# Patient Record
Sex: Male | Born: 1970 | Race: Black or African American | Hispanic: No | Marital: Single
Health system: Southern US, Community
[De-identification: ages and names within clinical notes are randomized; demographics above are authoritative.]

## PROBLEM LIST (undated history)

## (undated) DIAGNOSIS — F32A Depression, unspecified: Secondary | ICD-10-CM

## (undated) DIAGNOSIS — S31119A Laceration without foreign body of abdominal wall, unspecified quadrant without penetration into peritoneal cavity, initial encounter: Secondary | ICD-10-CM

## (undated) DIAGNOSIS — F1010 Alcohol abuse, uncomplicated: Secondary | ICD-10-CM

## (undated) DIAGNOSIS — G8929 Other chronic pain: Secondary | ICD-10-CM

## (undated) DIAGNOSIS — F1020 Alcohol dependence, uncomplicated: Secondary | ICD-10-CM

## (undated) DIAGNOSIS — F329 Major depressive disorder, single episode, unspecified: Secondary | ICD-10-CM

## (undated) DIAGNOSIS — M545 Low back pain, unspecified: Secondary | ICD-10-CM

## (undated) DIAGNOSIS — F1410 Cocaine abuse, uncomplicated: Secondary | ICD-10-CM

## (undated) DIAGNOSIS — F319 Bipolar disorder, unspecified: Secondary | ICD-10-CM

## (undated) HISTORY — PX: ABDOMINAL SURGERY: SHX537

---

## 2001-07-29 ENCOUNTER — Encounter: Payer: Self-pay | Admitting: Emergency Medicine

## 2001-07-29 ENCOUNTER — Encounter: Payer: Self-pay | Admitting: *Deleted

## 2001-07-29 ENCOUNTER — Emergency Department (HOSPITAL_COMMUNITY): Admission: EM | Admit: 2001-07-29 | Discharge: 2001-07-30 | Payer: Self-pay | Admitting: Emergency Medicine

## 2003-05-21 ENCOUNTER — Emergency Department (HOSPITAL_COMMUNITY): Admission: EM | Admit: 2003-05-21 | Discharge: 2003-05-21 | Payer: Self-pay

## 2004-10-02 ENCOUNTER — Emergency Department (HOSPITAL_COMMUNITY): Admission: EM | Admit: 2004-10-02 | Discharge: 2004-10-03 | Payer: Self-pay | Admitting: *Deleted

## 2006-07-03 ENCOUNTER — Emergency Department (HOSPITAL_COMMUNITY): Admission: EM | Admit: 2006-07-03 | Discharge: 2006-07-03 | Payer: Self-pay | Admitting: Emergency Medicine

## 2006-07-04 ENCOUNTER — Emergency Department (HOSPITAL_COMMUNITY): Admission: EM | Admit: 2006-07-04 | Discharge: 2006-07-05 | Payer: Self-pay | Admitting: Emergency Medicine

## 2006-11-05 ENCOUNTER — Emergency Department (HOSPITAL_COMMUNITY): Admission: EM | Admit: 2006-11-05 | Discharge: 2006-11-06 | Payer: Self-pay | Admitting: Emergency Medicine

## 2008-04-26 ENCOUNTER — Emergency Department (HOSPITAL_COMMUNITY): Admission: EM | Admit: 2008-04-26 | Discharge: 2008-04-27 | Payer: Self-pay | Admitting: Emergency Medicine

## 2008-04-27 ENCOUNTER — Ambulatory Visit: Payer: Self-pay | Admitting: Psychiatry

## 2008-04-27 ENCOUNTER — Inpatient Hospital Stay (HOSPITAL_COMMUNITY): Admission: AD | Admit: 2008-04-27 | Discharge: 2008-05-01 | Payer: Self-pay | Admitting: Psychiatry

## 2009-09-29 ENCOUNTER — Emergency Department (HOSPITAL_COMMUNITY): Admission: EM | Admit: 2009-09-29 | Discharge: 2009-09-29 | Payer: Self-pay | Admitting: Emergency Medicine

## 2011-01-15 LAB — COMPREHENSIVE METABOLIC PANEL
ALT: 17 U/L (ref 0–53)
AST: 21 U/L (ref 0–37)
Albumin: 3.9 g/dL (ref 3.5–5.2)
Alkaline Phosphatase: 81 U/L (ref 39–117)
BUN: 6 mg/dL (ref 6–23)
Chloride: 109 mEq/L (ref 96–112)
Creatinine, Ser: 1.07 mg/dL (ref 0.4–1.5)
GFR calc non Af Amer: >60 mL/min (ref >60)
Glucose, Bld: 75 mg/dL (ref 70–99)
Sodium: 143 mEq/L (ref 135–145)
Total Protein: 7.8 g/dL (ref 6.0–8.3)

## 2011-01-15 LAB — POCT I-STAT, CHEM 8
BUN: 4 mg/dL — ABNORMAL LOW (ref 6–23)
Chloride: 108 mEq/L (ref 96–112)
Creatinine, Ser: 1.1 mg/dL (ref 0.4–1.5)
HCT: 49 % (ref 39.0–52.0)
Hemoglobin: 16.7 g/dL (ref 13.0–17.0)
Potassium: 3.9 mEq/L (ref 3.5–5.1)
Sodium: 144 mEq/L (ref 135–145)
TCO2: 25 mmol/L (ref 0–100)

## 2011-01-15 LAB — RAPID URINE DRUG SCREEN, HOSP PERFORMED
Amphetamines: NOT DETECTED
Cocaine: POSITIVE — AB
Tetrahydrocannabinol: NOT DETECTED

## 2011-01-15 LAB — URINALYSIS, ROUTINE W REFLEX MICROSCOPIC
Bilirubin Urine: NEGATIVE
Glucose, UA: NEGATIVE mg/dL
Nitrite: NEGATIVE
Specific Gravity, Urine: 1.013 (ref 1.005–1.030)

## 2011-01-15 LAB — DIFFERENTIAL
Basophils Absolute: 0 10*3/uL (ref 0.0–0.1)
Lymphocytes Relative: 28 % (ref 12–46)
Monocytes Absolute: 0.4 10*3/uL (ref 0.1–1.0)
Neutro Abs: 3.6 10*3/uL (ref 1.7–7.7)

## 2011-01-15 LAB — GLUCOSE, CAPILLARY: Glucose-Capillary: 73 mg/dL (ref 70–99)

## 2011-01-15 LAB — CBC
HCT: 45.5 % (ref 39.0–52.0)
Platelets: 276 10*3/uL (ref 150–400)
RBC: 4.93 MIL/uL (ref 4.22–5.81)
RDW: 13.7 % (ref 11.5–15.5)
WBC: 6.1 10*3/uL (ref 4.0–10.5)

## 2011-01-15 LAB — ETHANOL: Alcohol, Ethyl (B): 70 mg/dL — ABNORMAL HIGH (ref 0–10)

## 2011-01-15 LAB — ACETAMINOPHEN LEVEL: Acetaminophen (Tylenol), Serum: <10 ug/mL — ABNORMAL LOW (ref 10–30)

## 2011-01-30 ENCOUNTER — Emergency Department (HOSPITAL_COMMUNITY)
Admission: EM | Admit: 2011-01-30 | Discharge: 2011-01-30 | Disposition: A | Discharge status: Home / Self Care | Payer: Self-pay | Attending: Emergency Medicine | Admitting: Emergency Medicine

## 2011-01-30 DIAGNOSIS — F3289 Other specified depressive episodes: Secondary | ICD-10-CM | POA: Insufficient documentation

## 2011-01-30 DIAGNOSIS — F329 Major depressive disorder, single episode, unspecified: Secondary | ICD-10-CM | POA: Insufficient documentation

## 2011-01-30 DIAGNOSIS — R45851 Suicidal ideations: Secondary | ICD-10-CM | POA: Insufficient documentation

## 2011-01-30 LAB — DIFFERENTIAL
Basophils Absolute: 0 10*3/uL (ref 0.0–0.1)
Eosinophils Absolute: 0.2 10*3/uL (ref 0.0–0.7)
Eosinophils Relative: 4 % (ref 0–5)
Lymphocytes Relative: 28 % (ref 12–46)
Lymphs Abs: 1.5 10*3/uL (ref 0.7–4.0)
Monocytes Relative: 13 % — ABNORMAL HIGH (ref 3–12)

## 2011-01-30 LAB — BASIC METABOLIC PANEL
BUN: 9 mg/dL (ref 6–23)
Calcium: 9.2 mg/dL (ref 8.4–10.5)

## 2011-01-30 LAB — RAPID URINE DRUG SCREEN, HOSP PERFORMED
Amphetamines: NOT DETECTED
Benzodiazepines: NOT DETECTED

## 2011-01-30 LAB — ETHANOL: Alcohol, Ethyl (B): <5 mg/dL (ref 0–10)

## 2011-01-30 LAB — CBC
HCT: 45.4 % (ref 39.0–52.0)
RDW: 13.9 % (ref 11.5–15.5)
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
## 2011-04-30 ENCOUNTER — Emergency Department (HOSPITAL_COMMUNITY)
Admission: EM | Admit: 2011-04-30 | Discharge: 2011-04-30 | Disposition: A | Discharge status: Home / Self Care | Payer: Self-pay | Attending: Emergency Medicine | Admitting: Emergency Medicine

## 2011-04-30 ENCOUNTER — Emergency Department (HOSPITAL_COMMUNITY): Payer: Self-pay

## 2011-04-30 DIAGNOSIS — Y9367 Activity, basketball: Secondary | ICD-10-CM | POA: Insufficient documentation

## 2011-04-30 DIAGNOSIS — M25579 Pain in unspecified ankle and joints of unspecified foot: Secondary | ICD-10-CM | POA: Insufficient documentation

## 2011-04-30 DIAGNOSIS — R296 Repeated falls: Secondary | ICD-10-CM | POA: Insufficient documentation

## 2011-05-25 ENCOUNTER — Emergency Department (HOSPITAL_COMMUNITY)
Admission: EM | Admit: 2011-05-25 | Discharge: 2011-05-26 | Disposition: A | Discharge status: Home / Self Care | Payer: No Typology Code available for payment source | Attending: Emergency Medicine | Admitting: Emergency Medicine

## 2011-05-25 DIAGNOSIS — R404 Transient alteration of awareness: Secondary | ICD-10-CM | POA: Insufficient documentation

## 2011-05-25 DIAGNOSIS — M545 Low back pain, unspecified: Secondary | ICD-10-CM | POA: Insufficient documentation

## 2011-05-25 DIAGNOSIS — M546 Pain in thoracic spine: Secondary | ICD-10-CM | POA: Insufficient documentation

## 2011-05-25 DIAGNOSIS — F1410 Cocaine abuse, uncomplicated: Secondary | ICD-10-CM | POA: Insufficient documentation

## 2011-05-25 DIAGNOSIS — IMO0002 Reserved for concepts with insufficient information to code with codable children: Secondary | ICD-10-CM | POA: Insufficient documentation

## 2011-05-25 DIAGNOSIS — M542 Cervicalgia: Secondary | ICD-10-CM | POA: Insufficient documentation

## 2011-05-25 DIAGNOSIS — Y92410 Unspecified street and highway as the place of occurrence of the external cause: Secondary | ICD-10-CM | POA: Insufficient documentation

## 2011-05-25 DIAGNOSIS — T07XXXA Unspecified multiple injuries, initial encounter: Secondary | ICD-10-CM | POA: Insufficient documentation

## 2011-05-26 ENCOUNTER — Emergency Department (HOSPITAL_COMMUNITY): Payer: Self-pay

## 2011-05-26 LAB — URINALYSIS, ROUTINE W REFLEX MICROSCOPIC
Glucose, UA: NEGATIVE mg/dL
Leukocytes, UA: NEGATIVE
Nitrite: NEGATIVE
Specific Gravity, Urine: 1.014 (ref 1.005–1.030)
pH: 5.5 (ref 5.0–8.0)

## 2011-05-26 LAB — RAPID URINE DRUG SCREEN, HOSP PERFORMED
Benzodiazepines: NOT DETECTED
Cocaine: POSITIVE — AB
Opiates: NOT DETECTED

## 2011-07-12 LAB — CBC
HCT: 46.2
Hemoglobin: 15.6
MCHC: 33.8
MCV: 92.3
Platelets: 246
RBC: 5
RDW: 13.5
WBC: 4.3

## 2011-07-12 LAB — COMPREHENSIVE METABOLIC PANEL
ALT: 21
AST: 26
Albumin: 3.9
Alkaline Phosphatase: 65
BUN: 14
CO2: 27
Calcium: 9.1
Chloride: 103
Creatinine, Ser: 1.36
GFR calc Af Amer: >60
GFR calc non Af Amer: 60 — ABNORMAL LOW
Glucose, Bld: 100 — ABNORMAL HIGH
Potassium: 4.3
Sodium: 137
Total Bilirubin: 0.9
Total Protein: 7.3

## 2011-07-12 LAB — URINALYSIS, ROUTINE W REFLEX MICROSCOPIC
Bilirubin Urine: NEGATIVE
Glucose, UA: NEGATIVE
Hgb urine dipstick: NEGATIVE
Ketones, ur: NEGATIVE
Nitrite: NEGATIVE
Protein, ur: NEGATIVE
Specific Gravity, Urine: 1.024
Urobilinogen, UA: 1
pH: 7

## 2011-07-12 LAB — RAPID URINE DRUG SCREEN, HOSP PERFORMED
Amphetamines: NOT DETECTED
Barbiturates: NOT DETECTED
Benzodiazepines: NOT DETECTED
Cocaine: POSITIVE — AB
Opiates: NOT DETECTED
Tetrahydrocannabinol: POSITIVE — AB

## 2011-07-12 LAB — URINE CULTURE

## 2011-07-12 LAB — SAMPLE TO BLOOD BANK

## 2011-07-12 LAB — DIFFERENTIAL
Basophils Absolute: 0
Basophils Relative: 1
Eosinophils Absolute: 0.2
Eosinophils Relative: 4
Lymphocytes Relative: 30
Lymphs Abs: 1.3
Monocytes Absolute: 0.3
Monocytes Relative: 8
Neutro Abs: 2.5
Neutrophils Relative %: 58

## 2011-07-12 LAB — PROTIME-INR: INR: 1

## 2011-07-12 LAB — OCCULT BLOOD X 1 CARD TO LAB, STOOL: Fecal Occult Bld: NEGATIVE

## 2011-07-12 LAB — APTT: aPTT: 30

## 2011-07-12 LAB — SYPHILIS: RPR W/REFLEX TO RPR TITER AND TREPONEMAL ANTIBODIES, TRADITIONAL SCREENING AND DIAGNOSIS ALGORITHM: RPR Ser Ql: NONREACTIVE

## 2011-07-12 LAB — URINE MICROSCOPIC-ADD ON

## 2011-07-12 LAB — GC/CHLAMYDIA PROBE AMP, URINE: Chlamydia, Swab/Urine, PCR: NEGATIVE

## 2012-01-14 ENCOUNTER — Emergency Department (HOSPITAL_COMMUNITY): Payer: Self-pay

## 2012-01-14 ENCOUNTER — Emergency Department (HOSPITAL_COMMUNITY)
Admission: EM | Admit: 2012-01-14 | Discharge: 2012-01-14 | Disposition: A | Discharge status: Home / Self Care | Payer: Self-pay | Attending: Emergency Medicine | Admitting: Emergency Medicine

## 2012-01-14 ENCOUNTER — Encounter (HOSPITAL_COMMUNITY): Payer: Self-pay | Admitting: *Deleted

## 2012-01-14 DIAGNOSIS — F1210 Cannabis abuse, uncomplicated: Secondary | ICD-10-CM | POA: Insufficient documentation

## 2012-01-14 DIAGNOSIS — S62309A Unspecified fracture of unspecified metacarpal bone, initial encounter for closed fracture: Secondary | ICD-10-CM | POA: Insufficient documentation

## 2012-01-14 DIAGNOSIS — S62339A Displaced fracture of neck of unspecified metacarpal bone, initial encounter for closed fracture: Secondary | ICD-10-CM

## 2012-01-14 DIAGNOSIS — M545 Low back pain, unspecified: Secondary | ICD-10-CM | POA: Insufficient documentation

## 2012-01-14 DIAGNOSIS — G8929 Other chronic pain: Secondary | ICD-10-CM | POA: Insufficient documentation

## 2012-01-14 DIAGNOSIS — F17200 Nicotine dependence, unspecified, uncomplicated: Secondary | ICD-10-CM | POA: Insufficient documentation

## 2012-01-14 DIAGNOSIS — F1410 Cocaine abuse, uncomplicated: Secondary | ICD-10-CM | POA: Insufficient documentation

## 2012-01-14 DIAGNOSIS — F319 Bipolar disorder, unspecified: Secondary | ICD-10-CM | POA: Insufficient documentation

## 2012-01-14 DIAGNOSIS — F1010 Alcohol abuse, uncomplicated: Secondary | ICD-10-CM | POA: Insufficient documentation

## 2012-01-14 HISTORY — DX: Low back pain, unspecified: M54.50

## 2012-01-14 HISTORY — DX: Alcohol dependence, uncomplicated: F10.20

## 2012-01-14 HISTORY — DX: Low back pain: M54.5

## 2012-01-14 HISTORY — DX: Other chronic pain: G89.29

## 2012-01-14 HISTORY — DX: Laceration without foreign body of abdominal wall, unspecified quadrant without penetration into peritoneal cavity, initial encounter: S31.119A

## 2012-01-14 HISTORY — DX: Bipolar disorder, unspecified: F31.9

## 2012-01-14 LAB — COMPREHENSIVE METABOLIC PANEL
ALT: 26 U/L (ref 0–53)
AST: 44 U/L — ABNORMAL HIGH (ref 0–37)
Alkaline Phosphatase: 89 U/L (ref 39–117)
CO2: 27 mEq/L (ref 19–32)
Calcium: 9 mg/dL (ref 8.4–10.5)
Chloride: 106 mEq/L (ref 96–112)
GFR calc non Af Amer: 78 mL/min — ABNORMAL LOW (ref >90)
Potassium: 3.5 mEq/L (ref 3.5–5.1)
Sodium: 141 mEq/L (ref 135–145)

## 2012-01-14 LAB — DIFFERENTIAL
Basophils Absolute: 0 10*3/uL (ref 0.0–0.1)
Eosinophils Relative: 2 % (ref 0–5)
Lymphocytes Relative: 20 % (ref 12–46)
Lymphs Abs: 1.2 10*3/uL (ref 0.7–4.0)
Neutro Abs: 4.2 10*3/uL (ref 1.7–7.7)
Neutrophils Relative %: 68 % (ref 43–77)

## 2012-01-14 LAB — CBC
HCT: 42.7 % (ref 39.0–52.0)
RDW: 13.6 % (ref 11.5–15.5)
WBC: 6.2 10*3/uL (ref 4.0–10.5)

## 2012-01-14 LAB — RAPID URINE DRUG SCREEN, HOSP PERFORMED
Barbiturates: NOT DETECTED
Benzodiazepines: NOT DETECTED

## 2012-01-14 NOTE — Discharge Instructions (Signed)
Alcohol Problems Most adults who drink alcohol drink in moderation (not a lot) are at low risk for developing problems related to their drinking. However, all drinkers, including low-risk drinkers, should know about the health risks connected with drinking alcohol. RECOMMENDATIONS FOR LOW-RISK DRINKING  Drink in moderation. Moderate drinking is defined as follows:   Men - no more than 2 drinks per day.   Nonpregnant women - no more than 1 drink per day.   Over age 97 - no more than 1 drink per day.  A standard drink is 12 grams of pure alcohol, which is equal to a 12 ounce bottle of beer or wine cooler, a 5 ounce glass of wine, or 1.5 ounces of distilled spirits (such as whiskey, brandy, vodka, or rum).  ABSTAIN FROM (DO NOT DRINK) ALCOHOL:  When pregnant or considering pregnancy.   When taking a medication that interacts with alcohol.   If you are alcohol dependent.   A medical condition that prohibits drinking alcohol (such as ulcer, liver disease, or heart disease).  DISCUSS WITH YOUR CAREGIVER:  If you are at risk for coronary heart disease, discuss the potential benefits and risks of alcohol use: Light to moderate drinking is associated with lower rates of coronary heart disease in certain populations (for example, men over age 87 and postmenopausal women). Infrequent or nondrinkers are advised not to begin light to moderate drinking to reduce the risk of coronary heart disease so as to avoid creating an alcohol-related problem. Similar protective effects can likely be gained through proper diet and exercise.   Women and the elderly have smaller amounts of body water than men. As a result women and the elderly achieve a higher blood alcohol concentration after drinking the same amount of alcohol.   Exposing a fetus to alcohol can cause a broad range of birth defects referred to as Fetal Alcohol Syndrome (FAS) or Alcohol-Related Birth Defects (ARBD). Although FAS/ARBD is connected with  excessive alcohol consumption during pregnancy, studies also have reported neurobehavioral problems in infants born to mothers reporting drinking an average of 1 drink per day during pregnancy.   Heavier drinking (the consumption of more than 4 drinks per occasion by men and more than 3 drinks per occasion by women) impairs learning (cognitive) and psychomotor functions and increases the risk of alcohol-related problems, including accidents and injuries.  CAGE QUESTIONS:   Have you ever felt that you should Cut down on your drinking?   Have people Annoyed you by criticizing your drinking?   Have you ever felt bad or Guilty about your drinking?   Have you ever had a drink first thing in the morning to steady your nerves or get rid of a hangover (Eye opener)?  If you answered positively to any of these questions: You may be at risk for alcohol-related problems if alcohol consumption is:   Men: Greater than 14 drinks per week or more than 4 drinks per occasion.   Women: Greater than 7 drinks per week or more than 3 drinks per occasion.  Do you or your family have a medical history of alcohol-related problems, such as:  Blackouts.   Sexual dysfunction.   Depression.   Trauma.   Liver dysfunction.   Sleep disorders.   Hypertension.   Chronic abdominal pain.   Has your drinking ever caused you problems, such as problems with your family, problems with your work (or school) performance, or accidents/injuries?   Do you have a compulsion to drink or a preoccupation  with drinking?   Do you have poor control or are you unable to stop drinking once you have started?   Do you have to drink to avoid withdrawal symptoms?   Do you have problems with withdrawal such as tremors, nausea, sweats, or mood disturbances?   Does it take more alcohol than in the past to get you high?   Do you feel a strong urge to drink?   Do you change your plans so that you can have a drink?   Do you ever  drink in the morning to relieve the shakes or a hangover?  If you have answered a number of the previous questions positively, it may be time for you to talk to your caregivers, family, and friends and see if they think you have a problem. Alcoholism is a chemical dependency that keeps getting worse and will eventually destroy your health and relationships. Many alcoholics end up dead, impoverished, or in prison. This is often the end result of all chemical dependency.  Do not be discouraged if you are not ready to take action immediately.   Decisions to change behavior often involve up and down desires to change and feeling like you cannot decide.   Try to think more seriously about your drinking behavior.   Think of the reasons to quit.  WHERE TO GO FOR ADDITIONAL INFORMATION   The National Institute on Alcohol Abuse and Alcoholism (NIAAA)www.niaaa.nih.gov   [employer] on Alcoholism and Drug Dependence (NCADD)www.ncadd.org   American Society of Addiction Medicine (ASAM)www.https://anderson-Ostrum.com/  Document Released: 10/01/2005 Document Revised: 09/20/2011 Document Reviewed: 05/19/2008 [employer] Patient Information 2012 [place], [place].  Boxer's Fracture You have a break (fracture) of the fifth metacarpal bone. This is commonly called a boxer's fracture. This is the bone in the hand where the little finger attaches. The fracture is in the end of that bone, closest to the little finger. It is usually caused when you hit an object with a clenched fist. Often, the knuckle is pushed down by the impact. Sometimes, the fracture rotates out of position. A boxer's fracture will usually heal within 6 weeks, if it is treated properly and protected from re-injury. Surgery is sometimes needed. A cast, splint, or bulky hand dressing may be used to protect and immobilize a boxer's fracture. Do not remove this device or dressing until your caregiver approves. Keep your hand elevated, and apply ice packs for 15 to 20  minutes every 2 hours, for the first 2 days. Elevation and ice help reduce swelling and relieve pain. See your caregiver, or an orthopedic specialist, for follow-up care within the next 10 days. This is to make sure your fracture is healing properly. Document Released: 10/01/2005 Document Revised: 09/20/2011 Document Reviewed: 03/21/2007 [facility] Patient Information 2012 [place], [place].
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
## 2012-01-14 NOTE — ED Notes (Signed)
2 bags of clothing.  A twenty dollar bill, some coins, and a pack of cigarettes.
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
## 2012-01-14 NOTE — ED Notes (Signed)
Pt states "I've been dealing with some situations, or problems, that's what I run to, I do weed and drink 7 40's a day"
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
## 2012-01-15 ENCOUNTER — Encounter (HOSPITAL_COMMUNITY): Payer: Self-pay | Admitting: *Deleted

## 2012-01-15 ENCOUNTER — Other Ambulatory Visit: Payer: Self-pay

## 2012-01-15 ENCOUNTER — Emergency Department (HOSPITAL_COMMUNITY)
Admission: EM | Admit: 2012-01-15 | Discharge: 2012-01-16 | Disposition: A | Discharge status: Rehabilitation Facility | Payer: Self-pay | Attending: Pediatrics | Admitting: Pediatrics

## 2012-01-15 DIAGNOSIS — F1010 Alcohol abuse, uncomplicated: Secondary | ICD-10-CM | POA: Insufficient documentation

## 2012-01-15 DIAGNOSIS — F1410 Cocaine abuse, uncomplicated: Secondary | ICD-10-CM | POA: Insufficient documentation

## 2012-01-15 DIAGNOSIS — F1910 Other psychoactive substance abuse, uncomplicated: Secondary | ICD-10-CM

## 2012-01-15 DIAGNOSIS — F319 Bipolar disorder, unspecified: Secondary | ICD-10-CM | POA: Insufficient documentation

## 2012-01-15 DIAGNOSIS — F1210 Cannabis abuse, uncomplicated: Secondary | ICD-10-CM | POA: Insufficient documentation

## 2012-01-15 LAB — COMPREHENSIVE METABOLIC PANEL
Albumin: 3.8 g/dL (ref 3.5–5.2)
BUN: 5 mg/dL — ABNORMAL LOW (ref 6–23)
Creatinine, Ser: 1.05 mg/dL (ref 0.50–1.35)
Potassium: 3.5 mEq/L (ref 3.5–5.1)
Total Protein: 7.7 g/dL (ref 6.0–8.3)

## 2012-01-15 LAB — DIFFERENTIAL
Basophils Relative: 1 % (ref 0–1)
Eosinophils Absolute: 0.1 10*3/uL (ref 0.0–0.7)
Monocytes Absolute: 0.4 10*3/uL (ref 0.1–1.0)
Monocytes Relative: 8 % (ref 3–12)
Neutrophils Relative %: 63 % (ref 43–77)

## 2012-01-15 LAB — CBC
Hemoglobin: 14 g/dL (ref 13.0–17.0)
MCH: 29.9 pg (ref 26.0–34.0)
MCHC: 33.4 g/dL (ref 30.0–36.0)
RDW: 13.8 % (ref 11.5–15.5)

## 2012-01-15 LAB — RAPID URINE DRUG SCREEN, HOSP PERFORMED
Benzodiazepines: NOT DETECTED
Cocaine: POSITIVE — AB

## 2012-01-15 LAB — ACETAMINOPHEN LEVEL: Acetaminophen (Tylenol), Serum: <15 ug/mL (ref 10–30)

## 2012-01-15 LAB — ETHANOL: Alcohol, Ethyl (B): <11 mg/dL (ref 0–11)

## 2012-01-15 MED ORDER — THIAMINE HCL 100 MG/ML IJ SOLN: 100.0000 mg | Freq: Every day | INTRAMUSCULAR | Status: DC

## 2012-01-15 MED ORDER — IBUPROFEN 600 MG PO TABS
600.0000 mg | ORAL_TABLET | Freq: Three times a day (TID) | ORAL | Status: DC | PRN
  Administered 2012-01-15: 600 mg via ORAL
  Filled 2012-01-15: qty 1

## 2012-01-15 MED ORDER — IBUPROFEN 600 MG PO TABS
600.0000 mg | ORAL_TABLET | Freq: Four times a day (QID) | ORAL | Status: DC | PRN
  Administered 2012-01-15: 600 mg via ORAL
  Filled 2012-01-15: qty 1

## 2012-01-15 MED ORDER — ACETAMINOPHEN 325 MG PO TABS
650.0000 mg | ORAL_TABLET | Freq: Four times a day (QID) | ORAL | Status: DC | PRN
  Administered 2012-01-15: 325 mg via ORAL

## 2012-01-15 MED ORDER — ALUM & MAG HYDROXIDE-SIMETH 200-200-20 MG/5ML PO SUSP: 30.0000 mL | ORAL | Status: DC | PRN

## 2012-01-15 MED ORDER — FOLIC ACID 1 MG PO TABS
1.0000 mg | ORAL_TABLET | Freq: Every day | ORAL | Status: DC
  Administered 2012-01-15: 1 mg via ORAL
  Filled 2012-01-15: qty 1

## 2012-01-15 MED ORDER — ADULT MULTIVITAMIN W/MINERALS CH: 1.0000 | ORAL_TABLET | Freq: Every day | ORAL | Status: DC

## 2012-01-15 MED ORDER — ZOLPIDEM TARTRATE 5 MG PO TABS
5.0000 mg | ORAL_TABLET | Freq: Every evening | ORAL | Status: DC | PRN
  Administered 2012-01-15: 5 mg via ORAL
  Filled 2012-01-15: qty 1

## 2012-01-15 MED ORDER — LORAZEPAM 1 MG PO TABS: 0.0000 mg | ORAL_TABLET | Freq: Two times a day (BID) | ORAL | Status: DC

## 2012-01-15 MED ORDER — ONDANSETRON HCL 4 MG PO TABS: 4.0000 mg | ORAL_TABLET | Freq: Three times a day (TID) | ORAL | Status: DC | PRN

## 2012-01-15 MED ORDER — ACETAMINOPHEN 325 MG PO TABS
ORAL_TABLET | ORAL | Status: AC
  Administered 2012-01-15: 325 mg via ORAL
  Filled 2012-01-15: qty 2

## 2012-01-15 MED ORDER — LORAZEPAM 2 MG/ML IJ SOLN: 1.0000 mg | Freq: Four times a day (QID) | INTRAMUSCULAR | Status: DC | PRN

## 2012-01-15 MED ORDER — VITAMIN B-1 100 MG PO TABS
100.0000 mg | ORAL_TABLET | Freq: Every day | ORAL | Status: DC
  Administered 2012-01-15: 100 mg via ORAL
  Filled 2012-01-15: qty 1

## 2012-01-15 MED ORDER — LORAZEPAM 1 MG PO TABS: 1.0000 mg | ORAL_TABLET | Freq: Four times a day (QID) | ORAL | Status: DC | PRN

## 2012-01-15 MED ORDER — NICOTINE 21 MG/24HR TD PT24
21.0000 mg | MEDICATED_PATCH | Freq: Every day | TRANSDERMAL | Status: DC
  Filled 2012-01-15: qty 1

## 2012-01-15 MED ORDER — LORAZEPAM 1 MG PO TABS: 0.0000 mg | ORAL_TABLET | Freq: Four times a day (QID) | ORAL | Status: DC

## 2012-01-15 NOTE — ED Notes (Signed)
pts belonging placed in locker #[id number]

## 2012-01-15 NOTE — ED Provider Notes (Signed)
Medical screening examination/treatment/procedure(s) were performed by non-physician practitioner and as supervising physician I was immediately available for consultation/collaboration.  Ethelda Chick, MD 01/15/12 [phone]
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
## 2012-01-15 NOTE — ED Notes (Signed)
Pt states he wants detox from alcohol and cocaine. Pt states he was seen yesterday but needs more help
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
## 2012-01-16 NOTE — ED Notes (Signed)
Pt accepted to [facility] and discharged to self. Transporter present to transport pt to facility. Departs in stable condition.

## 2012-01-16 NOTE — Discharge Instructions (Signed)
Please proceed to [facility] as directed.  Drug Abuse, Frequently Asked Questions Drug addiction is a complex brain disease. It is characterized by compulsive, at times uncontrollable, drug craving, seeking, and use that persists even in the face of extremely negative results. Drug seeking becomes compulsive, in large part as a result of the effects of prolonged drug use on brain functioning and, thus, on behavior. For many people, drug addiction becomes chronic, with relapses possible even after long periods of being off the drug. HOW QUICKLY CAN I BECOME ADDICTED TO A DRUG? There is no easy answer to this. If and how quickly you might become addicted to a drug depends on many factors including the biology of your body. All drugs are potentially harmful and may have life-threatening consequences associated with their use. There are also vast differences among individuals in sensitivity to various drugs. While one person may use a drug many times and suffer no ill effects, another person may be particularly vulnerable and overdose or developing a craving with the first use. There is no way of knowing in advance how someone may react. HOW DO I KNOW IF SOMEONE IS ADDICTED TO DRUGS? If a person is compulsively seeking and using a drug despite negative consequences (such as loss of job, debt, physical problems brought on by drug abuse, or family problems) then he or she is probably addicted. Those who screen for drug problems, such as physicians, have developed the CAGE questionnaire. These four simple questions can help detect substance abuse problems:  Have you ever felt you ought to Cut down on your drinking/drug use?   Have people ever Annoyed you by criticizing your drinking/drug use?   Have you ever felt bad or Guilty about your drinking/drug use?   Have you ever had a drink or taken a drug first thing in the morning to steady your nerves or get rid of a hangover (Eye-opener)?  WHAT ARE THE PHYSICAL  SIGNS OF ABUSE OR ADDICTION? The physical signs of abuse or addiction can vary depending on the person and the drug being abused. For example, someone who abuses marijuana may have a chronic cough or worsening of asthmatic conditions. THC, the chemical in marijuana responsible for producing its effects, is associated with weakening the immune system which makes the user more vulnerable to infections, such as pneumonia. Each drug has short-term and long-term physical effects. Stimulants like cocaine increase heart rate and blood pressure, whereas opioids like heroin may slow the heart rate and reduce breathing (respiration).  ARE THERE EFFECTIVE TREATMENTS FOR DRUG ADDICTION? Drug addiction can be effectively treated with behavioral-based therapies and, for addiction to some drugs such as heroin or nicotine, medications may be used. Treatment may vary for each person depending on the type of drug(s) being used and multiple courses of treatment may be needed to achieve success. Research has revealed 13 basic principles that underlie effective drug addiction treatment. These are discussed in NIDA's Principles of Drug Addiction Treatment: A Research-Based Guide. WHERE CAN I FIND INFORMATION ABOUT DRUG TREATMENT PROGRAMS?  For referrals to treatment programs, visit the Substance Abuse and Mental Health Services Administration online at http://findtreatment.http://gonzalez-rivas.net/.   NIDA publishes an expanding series of treatment manuals, the "clinical toolbox," that gives drug treatment providers research-based information for creating effective treatment programs.  WHAT IS DETOXIFICATION, OR "DETOX"? Detoxification is the process of allowing the body to rid itself of a drug while managing the symptoms of withdrawal. It is often the first step in a drug treatment  program and should be followed by treatment with a behavioral-based therapy and/or a medication, if available. Detox alone with no follow-up is not treatment.    WHAT IS WITHDRAWAL? HOW LONG DOES IT LAST? Withdrawal is the variety of symptoms that occur after use of some addictive drugs is reduced or stopped. Length of withdrawal and symptoms vary with the type of drug. For example, physical symptoms of heroin withdrawal may include restlessness, muscle and bone pain, insomnia, diarrhea, vomiting, and cold flashes. These physical symptoms may last for several days, but the general depression, or dysphoria (opposite of euphoria), that often accompanies heroin withdrawal, may last for weeks. In many cases withdrawal can be easily treated with medications to ease the symptoms. But treating withdrawal is not the same as treating addiction.  WHAT ARE THE COSTS OF DRUG ABUSE TO SOCIETY? Beyond the raw numbers are other costs to society:  Spread of infectious diseases such as HIV/AIDS and hepatitis C either through sharing of drug paraphernalia or unprotected sex.   Deaths due to overdose or other complications from drug use.   Effects on unborn children of pregnant drug users.   Other effects such as crime and homelessness.  IF A PREGNANT WOMAN ABUSES DRUGS, DOES IT AFFECT THE FETUS?  Many substances including alcohol, nicotine, and drugs of abuse can have negative effects on the developing fetus because they are transferred to the fetus across the placenta. For example, nicotine has been connected with premature birth and low birth weight, as has the use of cocaine. Scientific studies have shown that babies born to marijuana users were shorter, weighed less, and had smaller head sizes than those born to mothers who did not use the drug. Smaller babies are more likely to develop health problems.   Whether a baby's health problems, if caused by a drug, will continue as the child grows, is not always known. Research does show that children born to mothers who used marijuana regularly during pregnancy may have trouble concentrating, when older. Our research  continues to produce insights on the negative effects of drug use on the fetus.  Document Released: 10/04/2003 Document Revised: 09/20/2011 Document Reviewed: 12/31/2008 [facility] Patient Information 2012 [place], [place].

## 2012-01-19 ENCOUNTER — Emergency Department (HOSPITAL_COMMUNITY): Payer: Self-pay

## 2012-01-19 ENCOUNTER — Encounter (HOSPITAL_COMMUNITY): Payer: Self-pay | Admitting: *Deleted

## 2012-01-19 ENCOUNTER — Emergency Department (HOSPITAL_COMMUNITY)
Admission: EM | Admit: 2012-01-19 | Discharge: 2012-01-20 | Disposition: A | Discharge status: Home / Self Care | Payer: Self-pay | Attending: Psychiatry | Admitting: Psychiatry

## 2012-01-19 DIAGNOSIS — F319 Bipolar disorder, unspecified: Secondary | ICD-10-CM | POA: Insufficient documentation

## 2012-01-19 DIAGNOSIS — F17200 Nicotine dependence, unspecified, uncomplicated: Secondary | ICD-10-CM | POA: Insufficient documentation

## 2012-01-19 DIAGNOSIS — IMO0002 Reserved for concepts with insufficient information to code with codable children: Secondary | ICD-10-CM | POA: Insufficient documentation

## 2012-01-19 DIAGNOSIS — F1910 Other psychoactive substance abuse, uncomplicated: Secondary | ICD-10-CM | POA: Insufficient documentation

## 2012-01-19 DIAGNOSIS — S62309A Unspecified fracture of unspecified metacarpal bone, initial encounter for closed fracture: Secondary | ICD-10-CM | POA: Insufficient documentation

## 2012-01-19 DIAGNOSIS — R45850 Homicidal ideations: Secondary | ICD-10-CM | POA: Insufficient documentation

## 2012-01-19 LAB — BASIC METABOLIC PANEL
BUN: 7 mg/dL (ref 6–23)
Calcium: 9.2 mg/dL (ref 8.4–10.5)
Creatinine, Ser: 0.86 mg/dL (ref 0.50–1.35)
GFR calc Af Amer: >90 mL/min (ref >90)
GFR calc non Af Amer: >90 mL/min (ref >90)

## 2012-01-19 LAB — RAPID URINE DRUG SCREEN, HOSP PERFORMED: Barbiturates: NOT DETECTED

## 2012-01-19 LAB — CBC
HCT: 44.4 % (ref 39.0–52.0)
MCH: 31.3 pg (ref 26.0–34.0)
MCHC: 34.9 g/dL (ref 30.0–36.0)
MCV: 89.5 fL (ref 78.0–100.0)
Platelets: 272 10*3/uL (ref 150–400)
RDW: 14.2 % (ref 11.5–15.5)
WBC: 11.3 10*3/uL — ABNORMAL HIGH (ref 4.0–10.5)

## 2012-01-19 LAB — ETHANOL: Alcohol, Ethyl (B): 98 mg/dL — ABNORMAL HIGH (ref 0–11)

## 2012-01-19 MED ORDER — HYDROCODONE-ACETAMINOPHEN 5-325 MG PO TABS
1.0000 | ORAL_TABLET | Freq: Once | ORAL | Status: AC
  Administered 2012-01-19: 1 via ORAL
  Filled 2012-01-19: qty 1

## 2012-01-19 MED ORDER — ONDANSETRON HCL 8 MG PO TABS: 4.0000 mg | ORAL_TABLET | Freq: Three times a day (TID) | ORAL | Status: DC | PRN

## 2012-01-19 MED ORDER — IBUPROFEN 800 MG PO TABS
800.0000 mg | ORAL_TABLET | Freq: Once | ORAL | Status: AC
  Administered 2012-01-19: 800 mg via ORAL
  Filled 2012-01-19: qty 1

## 2012-01-19 MED ORDER — NICOTINE 21 MG/24HR TD PT24: 21.0000 mg | MEDICATED_PATCH | Freq: Every day | TRANSDERMAL | Status: DC

## 2012-01-19 NOTE — ED Notes (Signed)
Patient telepsych form completed and given to ed secretary for faxing

## 2012-01-19 NOTE — Progress Notes (Signed)
Orthopedic Tech Progress Note Patient Details:  Thomas [name] December 17, 1970 [id number]  Splint Location: unna gutter Splint Interventions: Application    [name], [name] 01/19/2012, 10:14 AM

## 2012-01-19 NOTE — ED Provider Notes (Signed)
Psychiatry consult appreciated. Psychiatrist feels that the patient can be managed as an outpatient. However, I have gone to interview the patient and he still claims that he has homicidal ideation and will not tell me who it is that he wants to hurt and kill. Psychiatrist has suggested that he be started on Seroquel 100 mg every night at bedtime. This will be started. He does not have any psychiatric services that he is involved with currently so he will need to be given appropriate referrals. Also, at this time, I do not feel comfortable with him being discharged with apparently active homicidal ideation in spite of the psychiatrist's opinion.  Dione Booze, MD 01/19/12 [phone]
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
## 2012-01-19 NOTE — ED Notes (Signed)
Regular diet ordered.

## 2012-01-19 NOTE — ED Notes (Signed)
Patient transported to X-ray 
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
## 2012-01-19 NOTE — ED Notes (Addendum)
Pt had a cast on (R) hand.  Fell last night, breaking his cast (Dr. Ophelia Charter)  Cast removed by patient PTA.  No pain or injury from fall.  Pt also requesting detox from cocaine, weed and ETOH.  Last use of weed was yesterday, cocaine-today, ETOH-one hour PTA.  Denies SI.  Reports that he is HI because 'he has isues'.  Denies auditory or visual hallucinations.  Denies desire to injure one specific person.  Expressing desire to go to [place].  No distress noted.

## 2012-01-19 NOTE — ED Notes (Signed)
Pt reports having issues, states "I'm depressed, I need to get away before I hurt someone, if I tell you the people yall would put them in protective custody." Pt reports HI toward others, will not give this writer their names or relation to patient, pt reports SI sometimes. Pt states "I have a whole lot of plans of actions to harm myself and others." Pt states "I wouldn't have any remorse for harming them people, I just think how it will effect my children. The damage I would do to these people would be a beautiful sight, put a smile on my face."

## 2012-01-20 MED ORDER — HYDROCODONE-ACETAMINOPHEN 5-325 MG PO TABS
1.0000 | ORAL_TABLET | Freq: Four times a day (QID) | ORAL | Status: DC | PRN
  Administered 2012-01-20: 1 via ORAL
  Filled 2012-01-20: qty 1

## 2012-01-20 MED ORDER — QUETIAPINE FUMARATE 100 MG PO TABS
100.0000 mg | ORAL_TABLET | Freq: Every day | ORAL | Status: DC
  Administered 2012-01-20: 100 mg via ORAL
  Filled 2012-01-20: qty 1

## 2012-01-20 MED ORDER — IBUPROFEN 800 MG PO TABS
800.0000 mg | ORAL_TABLET | Freq: Three times a day (TID) | ORAL | Status: DC
  Administered 2012-01-20: 800 mg via ORAL
  Filled 2012-01-20: qty 1

## 2012-01-20 NOTE — ED Notes (Addendum)
Pt is sleeping at this time with sitter at bedside, will offer daily hygiene when pt awakes. Lunch ordered

## 2012-01-20 NOTE — ED Notes (Signed)
Received call from pt girlfriend requesting to talk with pt. Gave pt telephone to call girlfriend. Pt attempted to call girlfriend three times with no response.

## 2012-01-20 NOTE — ED Notes (Signed)
Pt states he removed his cast because he is having too much pain. Explained that the cast is there to help his arm heal. Pt states he wants pain medicine that the ibuprofen is not working. edp informed.
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
## 2012-01-20 NOTE — [facility] Counselor (Signed)
Information faxed to [facility]

## 2012-01-20 NOTE — Discharge Instructions (Signed)
RESOURCE GUIDE  Dental Problems  Patients with Medicaid: [facility]                     [facility] [address]                                           [address] Phone:  [phone]                                                  Phone:  [phone]  If unable to pay or uninsured, contact:  [facility] or [facility] to become qualified for the adult dental clinic.  Chronic Pain Problems Contact [facility]  [phone] Patients need to be referred by their primary care doctor.  Insufficient Money for Medicine Contact [employer]:  call "211" or [facility] 271-5999.  No Primary Care Doctor Call [facility]  832-8000 Other agencies that provide inexpensive medical care    [facility]  [phone]    [facility]  [phone]    [facility]  [phone]    Women's Clinic  [phone]    [facility]  [phone]    [facility]  [phone]  Psychological Services [facility]  [phone] [facility]  [phone] [facility]   800 853-5163 (emergency services 641-4993)  Substance Abuse Resources [facility]  336-882-2125 [facility] 336-784-9470 [facility] 336-285-9073 [facility] 336-845-3988 Residential & Outpatient Substance Abuse Program  800-659-3381  Abuse/Neglect [place] Child Abuse Hotline (336) 641-3795 [place] Child Abuse Hotline 800-378-5315 (After Hours)  Emergency Shelter [facility] (336) 271-5985  Maternity Homes [facility] (336) 275-9566 [facility] (704) 372-4663  MRSA Hotline #:   832-7006    [place] [facility]  [facility] [place]     [employer]                          [facility] [address] [place]                       [address]      [address]  [place]                                                [place]                            [place] Phone:  [phone]                                   Phone:  [phone]                 Phone:  [phone]  [facility] Phone:  [phone]    [facility] Child Abuse Hotline 434-157-5774 (231)352-0680 (After Hours)   You have signs of possible anxiety and/or depression. This is a very common problem.  Be sure to call your caregiver and arrange for follow-up care as suggested by our staff. RETURN IMMEDIATELY IF DEVELOP threat to harm self or others, suicidal or homicidal thoughts, hallucinations or confusion, unable to be cared for at home or uncontrolled behavior, or other concerns.

## 2012-01-20 NOTE — ED Provider Notes (Signed)
Patient has been calm and cooperative with his girlfriend at his bedside today. They're both comfortable with him being discharged now. He is not a threat to himself or others at all today. He denies suicidal or homicidal ideation and denies hallucinations. He does use alcoholic but does not want inpatient detox. He and his girlfriend state they will followup at outpatient resources for detox for him. They both guaranteed his safety and the safety of others state he will not harm himself or others. The girlfriend feels safe with him being discharged into her care. He has a splint on his right hand and is to follow up with orthopedics tomorrow as scheduled. He admits he has an anger problem and never should have said the things he did yesterday because he was never really suicidal or homicidal.  Thomas Horn, MD 01/21/12 2204

## 2012-01-20 NOTE — ED Notes (Signed)
Pt states he wants to see the doctor, he would like to be discharged and "did not mean the things he said." edp aware

## 2012-01-29 ENCOUNTER — Emergency Department (HOSPITAL_COMMUNITY): Payer: Self-pay

## 2012-01-29 ENCOUNTER — Emergency Department (HOSPITAL_COMMUNITY)
Admission: EM | Admit: 2012-01-29 | Discharge: 2012-01-30 | Disposition: A | Discharge status: Home / Self Care | Payer: Self-pay | Attending: Emergency Medicine | Admitting: Emergency Medicine

## 2012-01-29 ENCOUNTER — Encounter (HOSPITAL_COMMUNITY): Payer: Self-pay | Admitting: Emergency Medicine

## 2012-01-29 DIAGNOSIS — W19XXXA Unspecified fall, initial encounter: Secondary | ICD-10-CM | POA: Insufficient documentation

## 2012-01-29 DIAGNOSIS — S6991XA Unspecified injury of right wrist, hand and finger(s), initial encounter: Secondary | ICD-10-CM

## 2012-01-29 DIAGNOSIS — S6990XA Unspecified injury of unspecified wrist, hand and finger(s), initial encounter: Secondary | ICD-10-CM | POA: Insufficient documentation

## 2012-01-29 DIAGNOSIS — M79609 Pain in unspecified limb: Secondary | ICD-10-CM | POA: Insufficient documentation
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
## 2012-01-30 MED ORDER — TRAMADOL HCL 50 MG PO TABS: 50.0000 mg | ORAL_TABLET | Freq: Four times a day (QID) | ORAL | Status: AC | PRN

## 2012-01-30 MED ORDER — OXYCODONE-ACETAMINOPHEN 5-325 MG PO TABS
1.0000 | ORAL_TABLET | Freq: Once | ORAL | Status: AC
  Administered 2012-01-30: 1 via ORAL
  Filled 2012-01-30: qty 1

## 2012-01-30 NOTE — Progress Notes (Signed)
Orthopedic Tech Progress Note Patient Details:  Thomas [name] May 31, 1971 [id number]  Type of Splint: Other (comment) Splint Location: ulna gutter   arm sling Splint Interventions: Application    [name], [name] 01/30/2012, 2:14 AM

## 2012-01-30 NOTE — Discharge Instructions (Signed)
Hand Injuries There are many types of hand injuries. You or your child may have a minor broken bone (fracture), sprain, bruises, or burns on the hand. HOME CARE  Keep the hand raised (elevated) above the level of the heart. Do this for the first few days until the pain and puffiness (swelling) get better.   Hand bandages (dressings) and splints are used to keep the hand still. This helps with pain and prevents more injury.   Do not remove the bandage and splint until your doctor says it is okay.   For broken bones, sprains, and bruises, you may put ice on the injured area.   Put ice in a plastic bag.   Place a towel between the skin and the bag.   Leave the ice on for 15 to 20 minutes every few hours. Do this for 2 to 3 days.   Medicine for pain and redness or puffiness (inflammation) is often helpful.   Some hand motion after an injury can decrease stiffness.   Do not do any activities that increase pain in the hand.   See your doctor for follow-up care as told.  GET HELP RIGHT AWAY IF:   The pain and puffiness get worse.   The pain is not controlled with medicine.   You or your child has a temperature by mouth above 102 F (38.9 C), not controlled by medicine.   There is pain when moving the fingers.   There is fluid (pus) coming from the wound.  MAKE SURE YOU:   Understand these instructions.   Will watch this condition.   Will get help right away if you or your child is not doing well or gets worse.  Document Released: 12/26/2009 Document Revised: 09/20/2011 Document Reviewed: 12/26/2009 [facility] Patient Information 2012 [place], [place].

## 2012-01-30 NOTE — ED Notes (Signed)
The pt has a boxers frac of the rt hand.  He has worn a cast for 2 weeks until earlier today when his cast  Was broken after a fall.  He took the cast off and is here  ??? For another cast.  He is already scheduled for surgery by dr Ophelia Charter
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
## 2012-01-30 NOTE — ED Provider Notes (Signed)
Medical screening examination/treatment/procedure(s) were performed by non-physician practitioner and as supervising physician I was immediately available for consultation/collaboration.   Glynn Octave, MD 01/30/12 Rickey Primus

## 2012-02-06 ENCOUNTER — Emergency Department (HOSPITAL_COMMUNITY)
Admission: EM | Admit: 2012-02-06 | Discharge: 2012-02-06 | Disposition: A | Discharge status: Home / Self Care | Payer: Self-pay | Attending: Emergency Medicine | Admitting: Emergency Medicine

## 2012-02-06 ENCOUNTER — Encounter (HOSPITAL_COMMUNITY): Payer: Self-pay | Admitting: *Deleted

## 2012-02-06 DIAGNOSIS — F319 Bipolar disorder, unspecified: Secondary | ICD-10-CM | POA: Insufficient documentation

## 2012-02-06 DIAGNOSIS — M545 Low back pain, unspecified: Secondary | ICD-10-CM | POA: Insufficient documentation

## 2012-02-06 DIAGNOSIS — W2209XA Striking against other stationary object, initial encounter: Secondary | ICD-10-CM | POA: Insufficient documentation

## 2012-02-06 DIAGNOSIS — G8929 Other chronic pain: Secondary | ICD-10-CM | POA: Insufficient documentation

## 2012-02-06 DIAGNOSIS — F1020 Alcohol dependence, uncomplicated: Secondary | ICD-10-CM | POA: Insufficient documentation

## 2012-02-06 DIAGNOSIS — S6990XA Unspecified injury of unspecified wrist, hand and finger(s), initial encounter: Secondary | ICD-10-CM | POA: Insufficient documentation

## 2012-02-06 DIAGNOSIS — R52 Pain, unspecified: Secondary | ICD-10-CM

## 2012-02-06 DIAGNOSIS — F17200 Nicotine dependence, unspecified, uncomplicated: Secondary | ICD-10-CM | POA: Insufficient documentation

## 2012-02-06 MED ORDER — TRAMADOL HCL 50 MG PO TABS: 50.0000 mg | ORAL_TABLET | Freq: Four times a day (QID) | ORAL | Status: DC | PRN

## 2012-02-06 NOTE — Discharge Instructions (Signed)
Keep your follow up appointment with the orthopedic surgeon. Do not drink alcohol. Take the medication as directed but do not drive with this medication. Continue to take ibuprofen but this medication. You can try icing and elevating intermittently. Musculoskeletal Pain Musculoskeletal pain is muscle and boney aches and pains. These pains can occur in any part of the body. Your caregiver may treat you without knowing the cause of the pain. They may treat you if blood or urine tests, X-rays, and other tests were normal.  CAUSES There is often not a definite cause or reason for these pains. These pains may be caused by a type of germ (virus). The discomfort may also come from overuse. Overuse includes working out too hard when your body is not fit. Boney aches also come from weather changes. Bone is sensitive to atmospheric pressure changes. HOME CARE INSTRUCTIONS   Ask when your test results will be ready. Make sure you get your test results.   Only take over-the-counter or prescription medicines for pain, discomfort, or fever as directed by your caregiver. If you were given medications for your condition, do not drive, operate machinery or power tools, or sign legal documents for 24 hours. Do not drink alcohol. Do not take sleeping pills or other medications that may interfere with treatment.   Continue all activities unless the activities cause more pain. When the pain lessens, slowly resume normal activities. Gradually increase the intensity and duration of the activities or exercise.   During periods of severe pain, bed rest may be helpful. Lay or sit in any position that is comfortable.   Putting ice on the injured area.   Put ice in a bag.   Place a towel between your skin and the bag.   Leave the ice on for 15 to 20 minutes, 3 to 4 times a day.   Follow up with your caregiver for continued problems and no reason can be found for the pain. If the pain becomes worse or does not go away, it  may be necessary to repeat tests or do additional testing. Your caregiver may need to look further for a possible cause.  SEEK IMMEDIATE MEDICAL CARE IF:  You have pain that is getting worse and is not relieved by medications.   You develop chest pain that is associated with shortness or breath, sweating, feeling sick to your stomach (nauseous), or throw up (vomit).   Your pain becomes localized to the abdomen.   You develop any new symptoms that seem different or that concern you.  MAKE SURE YOU:   Understand these instructions.   Will watch your condition.   Will get help right away if you are not doing well or get worse.  Document Released: 10/01/2005 Document Revised: 09/20/2011 Document Reviewed: 05/21/2008 [facility] Patient Information 2012 [place], [place].Musculoskeletal Pain Musculoskeletal pain is muscle and boney aches and pains. These pains can occur in any part of the body. Your caregiver may treat you without knowing the cause of the pain. They may treat you if blood or urine tests, X-rays, and other tests were normal.  CAUSES There is often not a definite cause or reason for these pains. These pains may be caused by a type of germ (virus). The discomfort may also come from overuse. Overuse includes working out too hard when your body is not fit. Boney aches also come from weather changes. Bone is sensitive to atmospheric pressure changes. HOME CARE INSTRUCTIONS   Ask when your test results will be  ready. Make sure you get your test results.   Only take over-the-counter or prescription medicines for pain, discomfort, or fever as directed by your caregiver. If you were given medications for your condition, do not drive, operate machinery or power tools, or sign legal documents for 24 hours. Do not drink alcohol. Do not take sleeping pills or other medications that may interfere with treatment.   Continue all activities unless the activities cause more pain. When the pain  lessens, slowly resume normal activities. Gradually increase the intensity and duration of the activities or exercise.   During periods of severe pain, bed rest may be helpful. Lay or sit in any position that is comfortable.   Putting ice on the injured area.   Put ice in a bag.   Place a towel between your skin and the bag.   Leave the ice on for 15 to 20 minutes, 3 to 4 times a day.   Follow up with your caregiver for continued problems and no reason can be found for the pain. If the pain becomes worse or does not go away, it may be necessary to repeat tests or do additional testing. Your caregiver may need to look further for a possible cause.  SEEK IMMEDIATE MEDICAL CARE IF:  You have pain that is getting worse and is not relieved by medications.   You develop chest pain that is associated with shortness or breath, sweating, feeling sick to your stomach (nauseous), or throw up (vomit).   Your pain becomes localized to the abdomen.   You develop any new symptoms that seem different or that concern you.  MAKE SURE YOU:   Understand these instructions.   Will watch your condition.   Will get help right away if you are not doing well or get worse.  Document Released: 10/01/2005 Document Revised: 09/20/2011 Document Reviewed: 05/21/2008 [facility] Patient Information 2012 [place], [place].Cryotherapy Cryotherapy means treatment with cold. Ice or gel packs can be used to reduce both pain and swelling. Ice is the most helpful within the first 24 to 48 hours after an injury or flareup from overusing a muscle or joint. Sprains, strains, spasms, burning pain, shooting pain, and aches can all be eased with ice. Ice can also be used when recovering from surgery. Ice is effective, has very few side effects, and is safe for most people to use. PRECAUTIONS  Ice is not a safe treatment option for people with:  Raynaud's phenomenon. This is a condition affecting small blood vessels in the  extremities. Exposure to cold may cause your problems to return.   Cold hypersensitivity. There are many forms of cold hypersensitivity, including:   Cold urticaria. Red, itchy hives appear on the skin when the tissues begin to warm after being iced.   Cold erythema. This is a red, itchy rash caused by exposure to cold.   Cold hemoglobinuria. Red blood cells break down when the tissues begin to warm after being iced. The hemoglobin that carry oxygen are passed into the urine because they cannot combine with blood proteins fast enough.   Numbness or altered sensitivity in the area being iced.  If you have any of the following conditions, do not use ice until you have discussed cryotherapy with your caregiver:  Heart conditions, such as arrhythmia, angina, or chronic heart disease.   High blood pressure.   Healing wounds or open skin in the area being iced.   Current infections.   Rheumatoid arthritis.   Poor circulation.  Diabetes.  Ice slows the blood flow in the region it is applied. This is beneficial when trying to stop inflamed tissues from spreading irritating chemicals to surrounding tissues. However, if you expose your skin to cold temperatures for too long or without the proper protection, you can damage your skin or nerves. Watch for signs of skin damage due to cold. HOME CARE INSTRUCTIONS Follow these tips to use ice and cold packs safely.  Place a dry or damp towel between the ice and skin. A damp towel will cool the skin more quickly, so you may need to shorten the time that the ice is used.   For a more rapid response, add gentle compression to the ice.   Ice for no more than 10 to 20 minutes at a time. The bonier the area you are icing, the less time it will take to get the benefits of ice.   Check your skin after 5 minutes to make sure there are no signs of a poor response to cold or skin damage.   Rest 20 minutes or more in between uses.   Once your skin is  numb, you can end your treatment. You can test numbness by very lightly touching your skin. The touch should be so light that you do not see the skin dimple from the pressure of your fingertip. When using ice, most people will feel these normal sensations in this order: cold, burning, aching, and numbness.   Do not use ice on someone who cannot communicate their responses to pain, such as small children or people with dementia.  HOW TO MAKE AN ICE PACK Ice packs are the most common way to use ice therapy. Other methods include ice massage, ice baths, and cryo-sprays. Muscle creams that cause a cold, tingly feeling do not offer the same benefits that ice offers and should not be used as a substitute unless recommended by your caregiver. To make an ice pack, do one of the following:  Place crushed ice or a bag of frozen vegetables in a sealable plastic bag. Squeeze out the excess air. Place this bag inside another plastic bag. Slide the bag into a pillowcase or place a damp towel between your skin and the bag.   Mix 3 parts water with 1 part rubbing alcohol. Freeze the mixture in a sealable plastic bag. When you remove the mixture from the freezer, it will be slushy. Squeeze out the excess air. Place this bag inside another plastic bag. Slide the bag into a pillowcase or place a damp towel between your skin and the bag.  SEEK MEDICAL CARE IF:  You develop white spots on your skin. This may give the skin a blotchy (mottled) appearance.   Your skin turns blue or pale.   Your skin becomes waxy or hard.   Your swelling gets worse.  MAKE SURE YOU:   Understand these instructions.   Will watch your condition.   Will get help right away if you are not doing well or get worse.  Document Released: 05/28/2011 Document Revised: 09/20/2011 Document Reviewed: 05/28/2011 [facility] Patient Information 2012 [place], [place].Cryotherapy Cryotherapy means treatment with cold. Ice or gel packs can be used to  reduce both pain and swelling. Ice is the most helpful within the first 24 to 48 hours after an injury or flareup from overusing a muscle or joint. Sprains, strains, spasms, burning pain, shooting pain, and aches can all be eased with ice. Ice can also be used when recovering from  surgery. Ice is effective, has very few side effects, and is safe for most people to use. PRECAUTIONS  Ice is not a safe treatment option for people with:  Raynaud's phenomenon. This is a condition affecting small blood vessels in the extremities. Exposure to cold may cause your problems to return.   Cold hypersensitivity. There are many forms of cold hypersensitivity, including:   Cold urticaria. Red, itchy hives appear on the skin when the tissues begin to warm after being iced.   Cold erythema. This is a red, itchy rash caused by exposure to cold.   Cold hemoglobinuria. Red blood cells break down when the tissues begin to warm after being iced. The hemoglobin that carry oxygen are passed into the urine because they cannot combine with blood proteins fast enough.   Numbness or altered sensitivity in the area being iced.  If you have any of the following conditions, do not use ice until you have discussed cryotherapy with your caregiver:  Heart conditions, such as arrhythmia, angina, or chronic heart disease.   High blood pressure.   Healing wounds or open skin in the area being iced.   Current infections.   Rheumatoid arthritis.   Poor circulation.   Diabetes.  Ice slows the blood flow in the region it is applied. This is beneficial when trying to stop inflamed tissues from spreading irritating chemicals to surrounding tissues. However, if you expose your skin to cold temperatures for too long or without the proper protection, you can damage your skin or nerves. Watch for signs of skin damage due to cold. HOME CARE INSTRUCTIONS Follow these tips to use ice and cold packs safely.  Place a dry or damp  towel between the ice and skin. A damp towel will cool the skin more quickly, so you may need to shorten the time that the ice is used.   For a more rapid response, add gentle compression to the ice.   Ice for no more than 10 to 20 minutes at a time. The bonier the area you are icing, the less time it will take to get the benefits of ice.   Check your skin after 5 minutes to make sure there are no signs of a poor response to cold or skin damage.   Rest 20 minutes or more in between uses.   Once your skin is numb, you can end your treatment. You can test numbness by very lightly touching your skin. The touch should be so light that you do not see the skin dimple from the pressure of your fingertip. When using ice, most people will feel these normal sensations in this order: cold, burning, aching, and numbness.   Do not use ice on someone who cannot communicate their responses to pain, such as small children or people with dementia.  HOW TO MAKE AN ICE PACK Ice packs are the most common way to use ice therapy. Other methods include ice massage, ice baths, and cryo-sprays. Muscle creams that cause a cold, tingly feeling do not offer the same benefits that ice offers and should not be used as a substitute unless recommended by your caregiver. To make an ice pack, do one of the following:  Place crushed ice or a bag of frozen vegetables in a sealable plastic bag. Squeeze out the excess air. Place this bag inside another plastic bag. Slide the bag into a pillowcase or place a damp towel between your skin and the bag.   Mix 3 parts water  with 1 part rubbing alcohol. Freeze the mixture in a sealable plastic bag. When you remove the mixture from the freezer, it will be slushy. Squeeze out the excess air. Place this bag inside another plastic bag. Slide the bag into a pillowcase or place a damp towel between your skin and the bag.  SEEK MEDICAL CARE IF:  You develop white spots on your skin. This may  give the skin a blotchy (mottled) appearance.   Your skin turns blue or pale.   Your skin becomes waxy or hard.   Your swelling gets worse.  MAKE SURE YOU:   Understand these instructions.   Will watch your condition.   Will get help right away if you are not doing well or get worse.  Document Released: 05/28/2011 Document Revised: 09/20/2011 Document Reviewed: 05/28/2011 [facility] Patient Information 2012 [place], [place].
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
## 2012-02-08 NOTE — ED Provider Notes (Signed)
Medical screening examination/treatment/procedure(s) were performed by non-physician practitioner and as supervising physician I was immediately available for consultation/collaboration.  Toy Baker, MD 02/08/12 1758

## 2012-02-15 ENCOUNTER — Emergency Department (HOSPITAL_COMMUNITY)
Admission: EM | Admit: 2012-02-15 | Discharge: 2012-02-15 | Disposition: A | Discharge status: Home / Self Care | Payer: Self-pay | Attending: Emergency Medicine | Admitting: Emergency Medicine

## 2012-02-15 ENCOUNTER — Encounter (HOSPITAL_COMMUNITY): Payer: Self-pay | Admitting: Emergency Medicine

## 2012-02-15 DIAGNOSIS — F10929 Alcohol use, unspecified with intoxication, unspecified: Secondary | ICD-10-CM

## 2012-02-15 DIAGNOSIS — M79609 Pain in unspecified limb: Secondary | ICD-10-CM | POA: Insufficient documentation

## 2012-02-15 DIAGNOSIS — F1010 Alcohol abuse, uncomplicated: Secondary | ICD-10-CM | POA: Insufficient documentation

## 2012-02-15 DIAGNOSIS — F1210 Cannabis abuse, uncomplicated: Secondary | ICD-10-CM | POA: Insufficient documentation

## 2012-02-15 DIAGNOSIS — F17200 Nicotine dependence, unspecified, uncomplicated: Secondary | ICD-10-CM | POA: Insufficient documentation

## 2012-02-15 DIAGNOSIS — F319 Bipolar disorder, unspecified: Secondary | ICD-10-CM | POA: Insufficient documentation

## 2012-02-15 DIAGNOSIS — IMO0002 Reserved for concepts with insufficient information to code with codable children: Secondary | ICD-10-CM | POA: Insufficient documentation

## 2012-02-15 DIAGNOSIS — R45850 Homicidal ideations: Secondary | ICD-10-CM | POA: Insufficient documentation

## 2012-02-15 DIAGNOSIS — F1994 Other psychoactive substance use, unspecified with psychoactive substance-induced mood disorder: Secondary | ICD-10-CM

## 2012-02-15 DIAGNOSIS — F1910 Other psychoactive substance abuse, uncomplicated: Secondary | ICD-10-CM

## 2012-02-15 DIAGNOSIS — R45851 Suicidal ideations: Secondary | ICD-10-CM | POA: Insufficient documentation

## 2012-02-15 LAB — COMPREHENSIVE METABOLIC PANEL
AST: 17 U/L (ref 0–37)
Albumin: 3.9 g/dL (ref 3.5–5.2)
Alkaline Phosphatase: 79 U/L (ref 39–117)
CO2: 23 mEq/L (ref 19–32)
Chloride: 107 mEq/L (ref 96–112)
Creatinine, Ser: 0.89 mg/dL (ref 0.50–1.35)
GFR calc non Af Amer: >90 mL/min (ref >90)
Potassium: 3.4 mEq/L — ABNORMAL LOW (ref 3.5–5.1)
Total Bilirubin: 0.2 mg/dL — ABNORMAL LOW (ref 0.3–1.2)

## 2012-02-15 LAB — RAPID URINE DRUG SCREEN, HOSP PERFORMED
Amphetamines: NOT DETECTED
Barbiturates: NOT DETECTED
Benzodiazepines: NOT DETECTED
Tetrahydrocannabinol: POSITIVE — AB

## 2012-02-15 LAB — ACETAMINOPHEN LEVEL: Acetaminophen (Tylenol), Serum: <15 ug/mL (ref 10–30)

## 2012-02-15 LAB — CBC
HCT: 43.9 % (ref 39.0–52.0)
MCV: 90.1 fL (ref 78.0–100.0)
Platelets: 266 10*3/uL (ref 150–400)
RBC: 4.87 MIL/uL (ref 4.22–5.81)
WBC: 7.8 10*3/uL (ref 4.0–10.5)

## 2012-02-15 LAB — ETHANOL: Alcohol, Ethyl (B): 94 mg/dL — ABNORMAL HIGH (ref 0–11)

## 2012-02-15 MED ORDER — SODIUM CHLORIDE 0.9 % IV BOLUS (SEPSIS)
1000.0000 mL | Freq: Once | INTRAVENOUS | Status: AC
  Administered 2012-02-15: 1000 mL via INTRAVENOUS

## 2012-02-15 MED ORDER — ZOLPIDEM TARTRATE 5 MG PO TABS: 5.0000 mg | ORAL_TABLET | Freq: Every evening | ORAL | Status: DC | PRN

## 2012-02-15 MED ORDER — NICOTINE 21 MG/24HR TD PT24
21.0000 mg | MEDICATED_PATCH | Freq: Every day | TRANSDERMAL | Status: DC
  Filled 2012-02-15: qty 1

## 2012-02-15 MED ORDER — MORPHINE SULFATE 4 MG/ML IJ SOLN
4.0000 mg | Freq: Once | INTRAMUSCULAR | Status: AC
  Administered 2012-02-15: 4 mg via INTRAVENOUS
  Filled 2012-02-15: qty 1

## 2012-02-15 MED ORDER — LORAZEPAM 1 MG PO TABS: 1.0000 mg | ORAL_TABLET | Freq: Three times a day (TID) | ORAL | Status: DC | PRN

## 2012-02-15 MED ORDER — THIAMINE HCL 100 MG/ML IJ SOLN
Freq: Once | INTRAVENOUS | Status: AC
  Administered 2012-02-15: 03:00:00 via INTRAVENOUS
  Filled 2012-02-15: qty 1000

## 2012-02-15 MED ORDER — HYDROCODONE-ACETAMINOPHEN 5-325 MG PO TABS
1.0000 | ORAL_TABLET | Freq: Four times a day (QID) | ORAL | Status: DC | PRN
  Administered 2012-02-15 (×2): 1 via ORAL
  Filled 2012-02-15 (×2): qty 1

## 2012-02-15 MED ORDER — THIAMINE HCL 100 MG/ML IJ SOLN
Freq: Once | INTRAVENOUS | Status: DC
  Filled 2012-02-15: qty 1000

## 2012-02-15 MED ORDER — ONDANSETRON HCL 4 MG PO TABS: 4.0000 mg | ORAL_TABLET | Freq: Three times a day (TID) | ORAL | Status: DC | PRN

## 2012-02-15 MED ORDER — ACETAMINOPHEN 325 MG PO TABS
650.0000 mg | ORAL_TABLET | ORAL | Status: DC | PRN
  Administered 2012-02-15: 650 mg via ORAL
  Filled 2012-02-15: qty 2

## 2012-02-15 MED ORDER — ALUM & MAG HYDROXIDE-SIMETH 200-200-20 MG/5ML PO SUSP: 30.0000 mL | ORAL | Status: DC | PRN

## 2012-02-15 NOTE — ED Notes (Signed)
Pt is going to discharge home per MD orders.

## 2012-02-15 NOTE — ED Notes (Signed)
Pt.'s girlfriedn, Irving Burton called.  Pt. Has used all allotted phone calls this morning.  Informed GF of this, GF became loud and yelled that we should not be able to tell a grown man how many calls he can make.  Informed GF that we would let pt. Know that she called, she wanted [place] to know that his probation ofc. Had called.  Informed GF that pt. Would be given this message.  Told pt. Of call, allowed pt. To call probation ofc.

## 2012-02-15 NOTE — ED Notes (Signed)
[id number]<GN> Expected date:02/15/12<BR> Expected time: 1:35 AM<BR> Means of arrival:Ambulance<BR> Comments:<BR> ETOH, hand injury
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
## 2012-02-15 NOTE — ED Notes (Signed)
Pt on phone at nurse's station 
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
## 2012-02-15 NOTE — ED Provider Notes (Signed)
Medical screening examination/treatment/procedure(s) were performed by non-physician practitioner and as supervising physician I was immediately available for consultation/collaboration.  Olivia Mackie, MD 02/15/12 [phone]

## 2012-02-15 NOTE — ED Notes (Signed)
Security at bedside to wand pt and pt's belongings.  

## 2012-02-15 NOTE — ED Notes (Signed)
Pt placed into blue paper scrubs.

## 2012-02-15 NOTE — ED Notes (Signed)
Pt walked out by security, discharged per MD orders.
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
## 2012-02-15 NOTE — ED Provider Notes (Signed)
Patient vehemently denies wishing to harm himself or others he wishes to go home. He states that he has a better relationship with his girlfriend and he wishes to break it off he is staying with his sister tonight. Patient appears calm and appropriate he is ambulatory Glasgow Coma Score 15. He wishes referral for outpatient substance abuse and psychiatric therapy he is discharged in stable condition. Instructed to call 911 if he develops any thoughts of harming himself or others  Doug Sou, MD 02/15/12 1814
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
## 2012-03-11 ENCOUNTER — Encounter (HOSPITAL_COMMUNITY): Payer: Self-pay

## 2012-03-11 ENCOUNTER — Encounter (HOSPITAL_COMMUNITY): Payer: Self-pay | Admitting: Emergency Medicine

## 2012-03-11 ENCOUNTER — Inpatient Hospital Stay (HOSPITAL_COMMUNITY)
Admission: AD | Admit: 2012-03-11 | Discharge: 2012-03-14 | DRG: 885 | Disposition: A | Discharge status: Home / Self Care | Payer: Federal, State, Local not specified - Other | Source: Ambulatory Visit | Attending: Psychiatry | Admitting: Psychiatry

## 2012-03-11 ENCOUNTER — Emergency Department (HOSPITAL_COMMUNITY)
Admission: EM | Admit: 2012-03-11 | Discharge: 2012-03-11 | Disposition: A | Discharge status: Other Acute Inpatient Hospital | Payer: Self-pay | Attending: Emergency Medicine | Admitting: Emergency Medicine

## 2012-03-11 DIAGNOSIS — F1910 Other psychoactive substance abuse, uncomplicated: Secondary | ICD-10-CM

## 2012-03-11 DIAGNOSIS — F17200 Nicotine dependence, unspecified, uncomplicated: Secondary | ICD-10-CM | POA: Insufficient documentation

## 2012-03-11 DIAGNOSIS — F1410 Cocaine abuse, uncomplicated: Secondary | ICD-10-CM | POA: Insufficient documentation

## 2012-03-11 DIAGNOSIS — F319 Bipolar disorder, unspecified: Secondary | ICD-10-CM | POA: Insufficient documentation

## 2012-03-11 DIAGNOSIS — M545 Low back pain, unspecified: Secondary | ICD-10-CM | POA: Insufficient documentation

## 2012-03-11 DIAGNOSIS — F39 Unspecified mood [affective] disorder: Secondary | ICD-10-CM | POA: Diagnosis present

## 2012-03-11 DIAGNOSIS — F10929 Alcohol use, unspecified with intoxication, unspecified: Secondary | ICD-10-CM

## 2012-03-11 DIAGNOSIS — F10239 Alcohol dependence with withdrawal, unspecified: Secondary | ICD-10-CM | POA: Diagnosis present

## 2012-03-11 DIAGNOSIS — F1220 Cannabis dependence, uncomplicated: Secondary | ICD-10-CM | POA: Diagnosis present

## 2012-03-11 DIAGNOSIS — F1020 Alcohol dependence, uncomplicated: Secondary | ICD-10-CM | POA: Diagnosis present

## 2012-03-11 DIAGNOSIS — F1010 Alcohol abuse, uncomplicated: Secondary | ICD-10-CM | POA: Diagnosis present

## 2012-03-11 DIAGNOSIS — IMO0001 Reserved for inherently not codable concepts without codable children: Secondary | ICD-10-CM

## 2012-03-11 DIAGNOSIS — R443 Hallucinations, unspecified: Secondary | ICD-10-CM | POA: Insufficient documentation

## 2012-03-11 DIAGNOSIS — F10939 Alcohol use, unspecified with withdrawal, unspecified: Secondary | ICD-10-CM | POA: Diagnosis present

## 2012-03-11 DIAGNOSIS — F1999 Other psychoactive substance use, unspecified with unspecified psychoactive substance-induced disorder: Secondary | ICD-10-CM | POA: Diagnosis present

## 2012-03-11 DIAGNOSIS — G8929 Other chronic pain: Secondary | ICD-10-CM | POA: Insufficient documentation

## 2012-03-11 DIAGNOSIS — IMO0002 Reserved for concepts with insufficient information to code with codable children: Secondary | ICD-10-CM | POA: Insufficient documentation

## 2012-03-11 DIAGNOSIS — F3130 Bipolar disorder, current episode depressed, mild or moderate severity, unspecified: Principal | ICD-10-CM | POA: Diagnosis present

## 2012-03-11 LAB — CBC
MCH: 31.5 pg (ref 26.0–34.0)
MCHC: 34.9 g/dL (ref 30.0–36.0)
MCV: 90.4 fL (ref 78.0–100.0)
Platelets: 284 10*3/uL (ref 150–400)
RDW: 14.2 % (ref 11.5–15.5)

## 2012-03-11 LAB — URINALYSIS, ROUTINE W REFLEX MICROSCOPIC
Glucose, UA: NEGATIVE mg/dL
Nitrite: NEGATIVE
Protein, ur: NEGATIVE mg/dL
pH: 6 (ref 5.0–8.0)

## 2012-03-11 LAB — BASIC METABOLIC PANEL
Calcium: 9.3 mg/dL (ref 8.4–10.5)
Creatinine, Ser: 1.15 mg/dL (ref 0.50–1.35)
GFR calc Af Amer: >90 mL/min (ref >90)

## 2012-03-11 LAB — URINE MICROSCOPIC-ADD ON

## 2012-03-11 LAB — GC/CHLAMYDIA PROBE AMP, URINE: GC Probe Amp, Urine: NEGATIVE

## 2012-03-11 LAB — RAPID URINE DRUG SCREEN, HOSP PERFORMED
Opiates: NOT DETECTED
Tetrahydrocannabinol: NOT DETECTED

## 2012-03-11 MED ORDER — CHLORDIAZEPOXIDE HCL 25 MG PO CAPS: 25.0000 mg | ORAL_CAPSULE | Freq: Every day | ORAL | Status: DC

## 2012-03-11 MED ORDER — ALUM & MAG HYDROXIDE-SIMETH 200-200-20 MG/5ML PO SUSP: 30.0000 mL | ORAL | Status: DC | PRN

## 2012-03-11 MED ORDER — ONDANSETRON HCL 4 MG PO TABS: 4.0000 mg | ORAL_TABLET | Freq: Three times a day (TID) | ORAL | Status: DC | PRN

## 2012-03-11 MED ORDER — LORAZEPAM 1 MG PO TABS: 1.0000 mg | ORAL_TABLET | Freq: Three times a day (TID) | ORAL | Status: DC | PRN

## 2012-03-11 MED ORDER — ONDANSETRON 4 MG PO TBDP: 4.0000 mg | ORAL_TABLET | Freq: Four times a day (QID) | ORAL | Status: DC | PRN

## 2012-03-11 MED ORDER — LOPERAMIDE HCL 2 MG PO CAPS: 2.0000 mg | ORAL_CAPSULE | ORAL | Status: DC | PRN

## 2012-03-11 MED ORDER — TRAZODONE HCL 100 MG PO TABS
100.0000 mg | ORAL_TABLET | Freq: Every evening | ORAL | Status: DC | PRN
  Administered 2012-03-11: 100 mg via ORAL
  Filled 2012-03-11: qty 1

## 2012-03-11 MED ORDER — ZOLPIDEM TARTRATE 5 MG PO TABS: 5.0000 mg | ORAL_TABLET | Freq: Every evening | ORAL | Status: DC | PRN

## 2012-03-11 MED ORDER — MAGNESIUM HYDROXIDE 400 MG/5ML PO SUSP: 30.0000 mL | Freq: Every day | ORAL | Status: DC | PRN

## 2012-03-11 MED ORDER — THIAMINE HCL 100 MG/ML IJ SOLN
100.0000 mg | Freq: Once | INTRAMUSCULAR | Status: AC
  Administered 2012-03-11: 100 mg via INTRAMUSCULAR

## 2012-03-11 MED ORDER — VITAMIN B-1 100 MG PO TABS
100.0000 mg | ORAL_TABLET | Freq: Every day | ORAL | Status: DC
  Administered 2012-03-12 – 2012-03-14 (×3): 100 mg via ORAL
  Filled 2012-03-11: qty 1
  Filled 2012-03-11: qty 14
  Filled 2012-03-11 (×3): qty 1

## 2012-03-11 MED ORDER — CHLORDIAZEPOXIDE HCL 25 MG PO CAPS
25.0000 mg | ORAL_CAPSULE | Freq: Three times a day (TID) | ORAL | Status: AC
  Administered 2012-03-13 (×3): 25 mg via ORAL
  Filled 2012-03-11 (×3): qty 1

## 2012-03-11 MED ORDER — NICOTINE 21 MG/24HR TD PT24: 21.0000 mg | MEDICATED_PATCH | Freq: Every day | TRANSDERMAL | Status: DC

## 2012-03-11 MED ORDER — HYDROXYZINE HCL 25 MG PO TABS: 25.0000 mg | ORAL_TABLET | Freq: Four times a day (QID) | ORAL | Status: DC | PRN

## 2012-03-11 MED ORDER — CHLORDIAZEPOXIDE HCL 25 MG PO CAPS
25.0000 mg | ORAL_CAPSULE | Freq: Once | ORAL | Status: AC
  Administered 2012-03-11: 25 mg via ORAL
  Filled 2012-03-11: qty 1

## 2012-03-11 MED ORDER — CHLORDIAZEPOXIDE HCL 25 MG PO CAPS: 25.0000 mg | ORAL_CAPSULE | Freq: Four times a day (QID) | ORAL | Status: DC | PRN

## 2012-03-11 MED ORDER — IBUPROFEN 600 MG PO TABS: 600.0000 mg | ORAL_TABLET | Freq: Three times a day (TID) | ORAL | Status: DC | PRN

## 2012-03-11 MED ORDER — ACETAMINOPHEN 325 MG PO TABS
650.0000 mg | ORAL_TABLET | Freq: Four times a day (QID) | ORAL | Status: DC | PRN
Start: 2012-03-11 — End: 2012-03-14
  Administered 2012-03-11 – 2012-03-12 (×3): 650 mg via ORAL

## 2012-03-11 MED ORDER — CHLORDIAZEPOXIDE HCL 25 MG PO CAPS
25.0000 mg | ORAL_CAPSULE | Freq: Four times a day (QID) | ORAL | Status: AC
  Administered 2012-03-11 – 2012-03-12 (×4): 25 mg via ORAL
  Filled 2012-03-11 (×4): qty 1

## 2012-03-11 MED ORDER — CHLORDIAZEPOXIDE HCL 25 MG PO CAPS
25.0000 mg | ORAL_CAPSULE | ORAL | Status: DC
  Administered 2012-03-14: 25 mg via ORAL
  Filled 2012-03-11: qty 28
  Filled 2012-03-11: qty 1

## 2012-03-11 MED ORDER — ADULT MULTIVITAMIN W/MINERALS CH
1.0000 | ORAL_TABLET | Freq: Every day | ORAL | Status: DC
  Administered 2012-03-12 – 2012-03-13 (×2): 1 via ORAL
  Administered 2012-03-14: 09:00:00 via ORAL
  Filled 2012-03-11: qty 1
  Filled 2012-03-11: qty 14
  Filled 2012-03-11 (×3): qty 1

## 2012-03-11 NOTE — ED Provider Notes (Signed)
Medical screening examination/treatment/procedure(s) were performed by non-physician practitioner and as supervising physician I was immediately available for consultation/collaboration.   Dayton Bailiff, MD 03/11/12 [phone]

## 2012-03-11 NOTE — ED Notes (Signed)
Pt has been accepted to [facility] bed 307-2 by Dr. Allena Katz to Dr. Koren Shiver. EDP notified and is in agreement with disposition. RN made aware to call report. Pt should be able to transfer by 6:30pm. All support paperwork has been completed by ACT team member and faxed to [facility]. No further needs identified at this time.
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
## 2012-03-11 NOTE — ED Notes (Signed)
Per EMS pt has been drinking tonight and has been using powder cocaine  Pt is angry and has been wanting to fight   Pt states he wants to get clean  [employer] was on scene upon EMS arrival  Pt cooperative with EMS
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
## 2012-03-11 NOTE — ED Notes (Signed)
PA at bedside.

## 2012-03-11 NOTE — BHH Counselor (Signed)
Pt accepted at [facility] by [facility] to [facility] Bed 307-2.

## 2012-03-11 NOTE — Discharge Instructions (Signed)
Go to [facility]hh

## 2012-03-11 NOTE — Tx Team (Signed)
Initial Interdisciplinary Treatment Plan  PATIENT STRENGTHS: (choose at least two) Capable of independent living General fund of knowledge Motivation for treatment/growth Physical Health Supportive family/friends  PATIENT STRESSORS: Financial difficulties Legal issue Marital or family conflict Substance abuse   PROBLEM LIST: Problem List/Patient Goals Date to be addressed Date deferred Reason deferred Estimated date of resolution                                                         DISCHARGE CRITERIA:  Ability to meet basic life and health needs Need for constant or close observation no longer present Reduction of life-threatening or endangering symptoms to within safe limits Verbal commitment to aftercare and medication compliance Withdrawal symptoms are absent or subacute and managed without 24-hour nursing intervention  PRELIMINARY DISCHARGE PLAN: Attend aftercare/continuing care group Attend 12-step recovery group Outpatient therapy  PATIENT/FAMIILY INVOLVEMENT: This treatment plan has been presented to and reviewed with the patient, Thomas [name], and/or family member,   The patient and family have been given the opportunity to ask questions and make suggestions.  Noah Charon 03/11/2012, 9:13 PM
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
## 2012-03-12 DIAGNOSIS — F1010 Alcohol abuse, uncomplicated: Secondary | ICD-10-CM | POA: Diagnosis present

## 2012-03-12 DIAGNOSIS — F39 Unspecified mood [affective] disorder: Secondary | ICD-10-CM | POA: Diagnosis present

## 2012-03-12 DIAGNOSIS — F1410 Cocaine abuse, uncomplicated: Secondary | ICD-10-CM | POA: Diagnosis present

## 2012-03-12 LAB — URINE CULTURE: Culture  Setup Time: 201305281117

## 2012-03-12 MED ORDER — HYDROXYZINE HCL 25 MG PO TABS
25.0000 mg | ORAL_TABLET | Freq: Every evening | ORAL | Status: DC | PRN
  Administered 2012-03-12: 25 mg via ORAL
  Filled 2012-03-12: qty 28

## 2012-03-12 MED ORDER — QUETIAPINE FUMARATE 100 MG PO TABS: 100.0000 mg | ORAL_TABLET | Freq: Four times a day (QID) | ORAL | Status: DC | PRN

## 2012-03-12 MED ORDER — DIVALPROEX SODIUM ER 500 MG PO TB24
500.0000 mg | ORAL_TABLET | Freq: Every day | ORAL | Status: DC
  Administered 2012-03-12 – 2012-03-13 (×2): 500 mg via ORAL
  Filled 2012-03-12: qty 1
  Filled 2012-03-12: qty 14
  Filled 2012-03-12 (×2): qty 1

## 2012-03-12 MED ORDER — QUETIAPINE FUMARATE 100 MG PO TABS
100.0000 mg | ORAL_TABLET | Freq: Every day | ORAL | Status: DC
  Administered 2012-03-12 – 2012-03-13 (×2): 100 mg via ORAL
  Filled 2012-03-12 (×2): qty 1
  Filled 2012-03-12: qty 21
  Filled 2012-03-12: qty 1

## 2012-03-12 MED ORDER — QUETIAPINE FUMARATE 50 MG PO TABS
50.0000 mg | ORAL_TABLET | Freq: Every day | ORAL | Status: DC
  Administered 2012-03-12 – 2012-03-14 (×3): 50 mg via ORAL
  Filled 2012-03-12 (×7): qty 1

## 2012-03-12 NOTE — Progress Notes (Signed)
Patient ID: Thomas [name], male   DOB: 20-May-1971, 41 y.o.   MRN: [id number] He was in bed most of AM sleeping . Irritable when awoken. Up at lunch time and went to meal, stated that he sit by himself because of his anxiety. See new orders.
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
## 2012-03-12 NOTE — Progress Notes (Signed)
[facility] Group Notes:  (Counselor/Nursing/MHT/Case Management/Adjunct)  03/12/2012 4:59 PM  Type of Therapy:  Group Therapy at 11AM  Participation Level:  Did Not Attend   Thomas [name] 03/12/2012, 4:59 PM  [facility] Group Notes:  (Counselor/Nursing/MHT/Case Management/Adjunct)  03/12/2012 5:00 PM  Type of Therapy:  Group Therapy at 1:15  Participation Level:  Minimal  Participation Quality:  Sharing  Affect:  Irritable  Cognitive:  Oriented  Insight:  None shared  Engagement in Group:  Limited  Engagement in Therapy:  None  Modes of Intervention:  Clarification  Summary of Progress/Problems:  [name] attended group yet was obviously in early stages of withdrawal.  He sahred "This medication is beginning to work, I feel drunk. " Shortly afterwards he was asleep.   Thomas [name] 03/12/2012, 5:00 PM
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
## 2012-03-13 NOTE — Progress Notes (Signed)
Pt denies SI/HI/AVH. Pt limited. Pt angry, anxious, and irritable, yet cooperative. Pt states that he slept poor and his appetite is poor. PT states that his energy level is low and his ability to pay attention is poor. He rates his hopelessness and depression both as 10. Support and encouragement offered. Pt receptive.
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
## 2012-03-13 NOTE — Progress Notes (Signed)
Pt did not attend d/c planning group on this date.  SW met with pt individually at this time.  Pt presents with agitated affect and mood.  Pt was concerned about missing his orthopedic appt today; SW rescheduled his appt for next week.  Pt continues to state that he doesn't want rehab and wants to do outpatient services for his "mental problems".  Pt minimizes his substance abuse and does not appear motivated for inpatient treatment at this time.  SW will refer pt to [facility] for medication management and therapy.  Pt denies SI and states that he would hurt someone else before he hurts himself.  Pt denies HI at this time.  No further needs voiced by pt at this time.    Thomas [name], LCSWA 03/13/2012  11:46 AM
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
## 2012-03-13 NOTE — Progress Notes (Signed)
B[facility] Group Notes:  (Counselor/Nursing/MHT/Case Management/Adjunct)  03/13/2012 1:08 PM  Type of Therapy:  Group Therapy at 11 AM  Participation Level:  Did Not Attend    Clide Dales 03/13/2012, 1:08 PMBHH Group Notes:  (Counselor/Nursing/MHT/Case Management/Adjunct)  03/13/2012 7:47 PM  Type of Therapy:  Group Therapy at 1:15 PM  Participation Level:  Did Not Attend    Clide Dales 03/13/2012, 7:47 PM

## 2012-03-13 NOTE — H&P (Signed)
Cosigned for Dr. Bogard. 

## 2012-03-13 NOTE — Tx Team (Signed)
Interdisciplinary Treatment Plan Update (Adult)  Date:  03/13/2012  Time Reviewed:  9:56 AM   Progress in Treatment: Attending groups: Yes Participating in groups:  Yes Taking medication as prescribed: Yes Tolerating medication:  Yes Family/Significant othe contact made:  Counselor assessing for appropriate contact Patient understands diagnosis:  Yes Discussing patient identified problems/goals with staff:  Yes Medical problems stabilized or resolved:  Yes Denies suicidal/homicidal ideation: Yes Issues/concerns per patient self-inventory:  None identified Other: N/A  New problem(s) identified: None Identified  Reason for Continuation of Hospitalization: Anxiety Depression Homicidal ideation Medication stabilization  Interventions implemented related to continuation of hospitalization: mood stabilization, medication monitoring and adjustment, group therapy and psycho education, safety checks q 15 mins  Additional comments: N/A  Estimated length of stay: 3-5 days  Discharge Plan: Pt will follow up with [facility] for medication management and therapy.    New goal(s): N/A  Review of initial/current patient goals per problem list:    1.  Goal(s): Address substance use  Met:  No  Target date: by discharge  As evidenced by: completing detox protocol and refer to appropriate treatment  2.  Goal (s): Reduce depressive and anxiety symptoms  Met:  No  Target date: by discharge  As evidenced by: Reducing depression from a 10 to a 3 as reported by pt.    3.  Goal(s): Eliminate homicidal ideation  Met:  No  Target date: by discharge  As evidenced by: pt denying HI   Attendees: Patient:  Thomas [name] 03/13/2012 9:58 AM   Family:     Physician:  Lupe Carney, DO 03/13/2012 9:56 AM   Nursing: Elizabeth Palau, RN 03/13/2012 9:56 AM   Case Manager:  Reyes Ivan, LCSWA 03/13/2012  9:56 AM   Counselor:  Ronda Fairly, LCSWA 03/13/2012  9:56 AM   Other:  Richelle Ito, LCSW 03/13/2012 9:56 AM   Other:  Izola Price, RN 03/13/2012 9:58 AM   Other:     Other:      Scribe for Treatment Team:   Reyes Ivan 03/13/2012 9:56 AM

## 2012-03-14 DIAGNOSIS — F39 Unspecified mood [affective] disorder: Secondary | ICD-10-CM

## 2012-03-14 DIAGNOSIS — F1020 Alcohol dependence, uncomplicated: Secondary | ICD-10-CM

## 2012-03-14 DIAGNOSIS — F1420 Cocaine dependence, uncomplicated: Secondary | ICD-10-CM

## 2012-03-14 DIAGNOSIS — F10239 Alcohol dependence with withdrawal, unspecified: Secondary | ICD-10-CM

## 2012-03-14 DIAGNOSIS — F1220 Cannabis dependence, uncomplicated: Secondary | ICD-10-CM

## 2012-03-14 MED ORDER — DIVALPROEX SODIUM ER 500 MG PO TB24: 500.0000 mg | ORAL_TABLET | Freq: Every day | ORAL | Status: DC

## 2012-03-14 MED ORDER — QUETIAPINE FUMARATE 100 MG PO TABS: ORAL_TABLET | ORAL | Status: DC

## 2012-03-14 MED ORDER — THIAMINE HCL 100 MG PO TABS: 100.0000 mg | ORAL_TABLET | Freq: Every day | ORAL | Status: DC

## 2012-03-14 MED ORDER — HYDROXYZINE HCL 25 MG PO TABS: ORAL_TABLET | ORAL | Status: DC

## 2012-03-14 MED ORDER — CHLORDIAZEPOXIDE HCL 25 MG PO CAPS: 25.0000 mg | ORAL_CAPSULE | ORAL | Status: DC

## 2012-03-14 MED ORDER — ADULT MULTIVITAMIN W/MINERALS CH: 1.0000 | ORAL_TABLET | Freq: Every day | ORAL | Status: DC
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
## 2012-03-14 NOTE — Progress Notes (Signed)
Pt. States if someone makes him mad he will take care of it. Stated that he sometimes hears voices tell him to hurt people that are mean but has not heard any voices today. Pt. Would not voice who he would want to hurt but stated if someone tries to hurt him he will hurt them back. Pt. Does have a fractured right hand which he is getting follow up for by Dr. Ophelia Charter. Aaccording to Rod pt appointment was rescheduled. Denies SI or Hi and now does contract for safety. States his energy level is low and did not go for breakfast this am,. Tech did bring breakfast back for pt.
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
## 2012-03-14 NOTE — Progress Notes (Signed)
Patient ID: Thomas [name], male   DOB: 1970-12-26, 41 y.o.   MRN: [id number] Pt with dull, flat affect endorsing depression. Pt denies SI/HI at this time. Pt compliant with meds; no s/s of distress noted during shift.

## 2012-03-14 NOTE — Discharge Instructions (Signed)
Attend 90 meetings in 90 days. Get trusted sponsor from the advise of others or from whomever in meetings seems to make sense, has a proven track record, will hold you responsible for your sobriety, and both expects and insists on total abstinence.  Work the steps HONESTLY with the trusted sponsor. Get obsessed with your recovery by often reminding yourself of how DEADLY this dredged horrible disease of addiction JUST IS. Focus the first month on speaker meetings where you specifically look at how your life has been wrecked by drugs/alcohol and how your life has been similar to that of the speakers.  For what is believed to be alcohol effect on the brain, it advised that you get  regular exercise, regular sleep, and  consume good quality, fish oil, 1000 mg twice a day. These 3 things are the foundation of rehabilitating your brain. If memory is a problem then INSTEAD of the fish oil mentioned above, try using Brain Power Basics from [employer].  You can order online or by phone [phone]. It costs $99 for the first month, and $80 monthly thereafter, but that investment in your brain and the recovery of your brain proper functioning would seem worth it.  Strongly consider attending at least 6 Alanon Meetings to help you learn about how your helping others to the exclusion of helping yourself is actually hurting yourself and is actually an addiction to fixing others and that you need to work the 12 Step to Happiness through the [employer]. Al-Anon Family Groups could be helpful with how to deal with substance abusing family and friends. Or your own issues of being in victim role.  There are only 40 Alanon Family Group meetings a week here in [place].  Online are current listing of those meetings @ [email or web address]  There are [employer].  Search on line and there you can learn the format and can access the schedule for yourself.  They ask you to temproarily  block call waiting by starting with *70 then their number is [phone]

## 2012-03-14 NOTE — Progress Notes (Signed)
[facility] Group Notes:  (Counselor/Nursing/MHT/Case Management/Adjunct)  03/14/2012 11:58 AM  Type of Therapy:  Group Therapy at 11:00AM  Participation Level:  Did Not Attend  Clide Dales 03/14/2012, 11:58 AM  [facility] Group Notes:  (Counselor/Nursing/MHT/Case Management/Adjunct)  03/14/2012 3:32 PM  Type of Therapy:  Group Therapy  Participation Level:  Did Not Attend   Clide Dales 03/14/2012, 3:33 PM

## 2012-03-14 NOTE — Progress Notes (Signed)
Pt. Ready for discharge stating his sister is waiting to get him in the lobby. 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 deneis feeling SI or HI at this time and does contract for safety. Pt. Is excited about going home and does apppear clam at this time.

## 2012-03-14 NOTE — Tx Team (Signed)
Interdisciplinary Treatment Plan Update (Adult)  Date:  03/14/2012  Time Reviewed:  10:14 AM   Progress in Treatment: Attending groups: Yes Participating in groups:  Yes Taking medication as prescribed: Yes Tolerating medication:  Yes Family/Significant othe contact made:  Counselor attempting to contact sister today Patient understands diagnosis:  Yes Discussing patient identified problems/goals with staff:  Yes Medical problems stabilized or resolved:  Yes Denies suicidal/homicidal ideation: Yes Issues/concerns per patient self-inventory:  None identified Other: N/A  New problem(s) identified: None Identified  Reason for Continuation of Hospitalization: Stable to d/c  Interventions implemented related to continuation of hospitalization: Stable to d/c  Additional comments: N/A  Estimated length of stay: D/C today  Discharge Plan: Pt will [facility] at [facility] for medication management and therapy  New goal(s): N/A  Review of initial/current patient goals per problem list:    1.  Goal(s): Address substance use  Met:  Yes  Target date: by discharge  As evidenced by: completed detox protocol and referred to appropriate treatment  2.  Goal (s): Reduce depressive and anxiety symptoms  Met:  Yes  Target date: by discharge  As evidenced by: Reducing depression from a 10 to a 3 as reported by pt.  Pt ranks depression and anxiety at a 1 today.   3.  Goal(s): Eliminate HI  Met:  Yes  Target date: by discharge  As evidenced by: Pt denies HI.    Attendees: Patient:  Thomas [name] 03/14/2012 10:18 AM   Family:     Physician:   03/14/2012 10:14 AM   Nursing: Waynetta Sandy, RN 03/14/2012 10:14 AM   Case Manager:  Reyes Ivan, LCSWA 03/14/2012  10:14 AM   Counselor:  Ronda Fairly, LCSWA 03/14/2012  10:14 AM   Other:  Richelle Ito, LCSW 03/14/2012 10:14 AM   Other:  Lynann Bologna, NP 03/14/2012 10:17 AM   Other:     Other:      Scribe for Treatment Team:   Reyes Ivan 03/14/2012 10:14 AM

## 2012-03-17 NOTE — Progress Notes (Signed)
Patient Discharge Instructions:   No consents for [facility] and Dr. Ophelia Charter.  Karleen Hampshire Brittini, 03/17/2012, 5:02 PM

## 2012-04-07 ENCOUNTER — Encounter (HOSPITAL_COMMUNITY): Payer: Self-pay | Admitting: *Deleted

## 2012-04-07 ENCOUNTER — Emergency Department (HOSPITAL_COMMUNITY)
Admission: EM | Admit: 2012-04-07 | Discharge: 2012-04-07 | Disposition: A | Discharge status: Home / Self Care | Payer: Self-pay | Attending: Emergency Medicine | Admitting: Emergency Medicine

## 2012-04-07 ENCOUNTER — Emergency Department (HOSPITAL_COMMUNITY)
Admission: EM | Admit: 2012-04-07 | Discharge: 2012-04-08 | Disposition: A | Discharge status: Other Acute Inpatient Hospital | Payer: Self-pay | Attending: Emergency Medicine | Admitting: Emergency Medicine

## 2012-04-07 DIAGNOSIS — R42 Dizziness and giddiness: Secondary | ICD-10-CM | POA: Insufficient documentation

## 2012-04-07 DIAGNOSIS — F319 Bipolar disorder, unspecified: Secondary | ICD-10-CM | POA: Insufficient documentation

## 2012-04-07 DIAGNOSIS — Z79899 Other long term (current) drug therapy: Secondary | ICD-10-CM | POA: Insufficient documentation

## 2012-04-07 DIAGNOSIS — F17200 Nicotine dependence, unspecified, uncomplicated: Secondary | ICD-10-CM | POA: Insufficient documentation

## 2012-04-07 DIAGNOSIS — R5383 Other fatigue: Secondary | ICD-10-CM | POA: Insufficient documentation

## 2012-04-07 DIAGNOSIS — F1020 Alcohol dependence, uncomplicated: Secondary | ICD-10-CM | POA: Insufficient documentation

## 2012-04-07 DIAGNOSIS — R5381 Other malaise: Secondary | ICD-10-CM | POA: Insufficient documentation

## 2012-04-07 DIAGNOSIS — F1910 Other psychoactive substance abuse, uncomplicated: Secondary | ICD-10-CM

## 2012-04-07 DIAGNOSIS — F1410 Cocaine abuse, uncomplicated: Secondary | ICD-10-CM | POA: Insufficient documentation

## 2012-04-07 LAB — COMPREHENSIVE METABOLIC PANEL
ALT: 28 U/L (ref 0–53)
AST: 34 U/L (ref 0–37)
Albumin: 3.8 g/dL (ref 3.5–5.2)
Alkaline Phosphatase: 86 U/L (ref 39–117)
Chloride: 99 mEq/L (ref 96–112)
Potassium: 3.7 mEq/L (ref 3.5–5.1)
Sodium: 137 mEq/L (ref 135–145)
Total Bilirubin: 0.6 mg/dL (ref 0.3–1.2)

## 2012-04-07 LAB — ACETAMINOPHEN LEVEL: Acetaminophen (Tylenol), Serum: <15 ug/mL (ref 10–30)

## 2012-04-07 LAB — RAPID URINE DRUG SCREEN, HOSP PERFORMED
Amphetamines: POSITIVE — AB
Barbiturates: NOT DETECTED
Barbiturates: NOT DETECTED
Benzodiazepines: POSITIVE — AB
Benzodiazepines: POSITIVE — AB
Cocaine: POSITIVE — AB
Tetrahydrocannabinol: POSITIVE — AB

## 2012-04-07 LAB — VALPROIC ACID LEVEL: Valproic Acid Lvl: 18.7 ug/mL — ABNORMAL LOW (ref 50.0–100.0)

## 2012-04-07 LAB — CBC
MCH: 31 pg (ref 26.0–34.0)
MCHC: 34 g/dL (ref 30.0–36.0)
Platelets: 178 10*3/uL (ref 150–400)
Platelets: 198 10*3/uL (ref 150–400)
RBC: 4.64 MIL/uL (ref 4.22–5.81)
RDW: 14.1 % (ref 11.5–15.5)
WBC: 4.8 10*3/uL (ref 4.0–10.5)

## 2012-04-07 LAB — BASIC METABOLIC PANEL
Calcium: 8.8 mg/dL (ref 8.4–10.5)
GFR calc non Af Amer: >90 mL/min (ref >90)
Potassium: 4 mEq/L (ref 3.5–5.1)
Sodium: 137 mEq/L (ref 135–145)

## 2012-04-07 LAB — ETHANOL: Alcohol, Ethyl (B): <11 mg/dL (ref 0–11)

## 2012-04-07 MED ORDER — IBUPROFEN 600 MG PO TABS: 600.0000 mg | ORAL_TABLET | Freq: Three times a day (TID) | ORAL | Status: DC | PRN

## 2012-04-07 MED ORDER — NICOTINE 21 MG/24HR TD PT24: 21.0000 mg | MEDICATED_PATCH | Freq: Every day | TRANSDERMAL | Status: DC | PRN

## 2012-04-07 MED ORDER — VITAMIN B-1 100 MG PO TABS
100.0000 mg | ORAL_TABLET | Freq: Every day | ORAL | Status: DC
  Administered 2012-04-08: 100 mg via ORAL
  Filled 2012-04-07: qty 1

## 2012-04-07 MED ORDER — ADULT MULTIVITAMIN W/MINERALS CH
1.0000 | ORAL_TABLET | Freq: Every day | ORAL | Status: DC
  Administered 2012-04-08: 1 via ORAL
  Filled 2012-04-07: qty 1

## 2012-04-07 MED ORDER — QUETIAPINE FUMARATE 50 MG PO TABS
50.0000 mg | ORAL_TABLET | ORAL | Status: DC
  Administered 2012-04-07: 100 mg via ORAL
  Filled 2012-04-07: qty 2

## 2012-04-07 MED ORDER — HYDROXYZINE HCL 25 MG PO TABS: 25.0000 mg | ORAL_TABLET | Freq: Every day | ORAL | Status: DC

## 2012-04-07 MED ORDER — ONDANSETRON HCL 4 MG PO TABS: 4.0000 mg | ORAL_TABLET | Freq: Three times a day (TID) | ORAL | Status: DC | PRN

## 2012-04-07 MED ORDER — ACETAMINOPHEN 325 MG PO TABS: 650.0000 mg | ORAL_TABLET | ORAL | Status: DC | PRN

## 2012-04-07 MED ORDER — DIVALPROEX SODIUM ER 500 MG PO TB24
500.0000 mg | ORAL_TABLET | Freq: Two times a day (BID) | ORAL | Status: DC
  Administered 2012-04-07 – 2012-04-08 (×2): 500 mg via ORAL
  Filled 2012-04-07 (×3): qty 1

## 2012-04-07 MED ORDER — THIAMINE HCL 100 MG PO TABS: 100.0000 mg | ORAL_TABLET | Freq: Every day | ORAL | Status: DC

## 2012-04-07 MED ORDER — CHLORDIAZEPOXIDE HCL 25 MG PO CAPS
25.0000 mg | ORAL_CAPSULE | ORAL | Status: DC
  Administered 2012-04-07 – 2012-04-08 (×2): 25 mg via ORAL
  Filled 2012-04-07 (×2): qty 1

## 2012-04-07 NOTE — ED Notes (Signed)
Pt states he was at court, started feeling weak and tired. States he took his keppre and seroquel and MVI as ordered this am only. Pt reports he takes trazadone at hs.

## 2012-04-07 NOTE — ED Notes (Signed)
Pt states he smoked weed yesterday but denies any other drug use.

## 2012-04-07 NOTE — ED Notes (Signed)
Pt states he has not taken his trazadone today however he did have 1 blue tablet in his pocket that he identified as trazadone. Pt then states hes not taking keppra but is on depakote. Pt speech is slurred and falls asleep during interview.
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
## 2012-04-07 NOTE — ED Notes (Addendum)
Pt here for detox from ETOH, states last drink was this am. Usually drinks 10-40oz of beer a day and had 2-40oz this morning and "smoked a blunt." Pt denies HI/SI.
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
## 2012-04-07 NOTE — Discharge Instructions (Signed)
Avoid drug use - use resource guide provided. Also follow up with primary care doctor in coming week.  Return to ER if worse, new symptoms, other concern.     RESOURCE GUIDE  Chronic Pain Problems: Contact [facility]  [phone] Patients need to be referred by their primary care doctor.  Insufficient Money for Medicine: Contact United Way:  call "211" or [facility] [phone].  No Primary Care Doctor: - Call [facility]  [phone] - can help you locate a primary care doctor that  accepts your insurance, provides certain services, etc. - Physician Referral Service- [phone]  Agencies that provide inexpensive medical care: - [facility]  [phone] - [facility]  [phone] - [facility]  [phone] - Women's Clinic  [phone] - Planned Parenthood  [phone] [facility]  [phone]  Medicaid-accepting [facility] Providers: - [facility]- [address]  [phone], Mon-Fri 9am-7pm, Sat 9am-1pm - [facility]- [address] [place], [address] [place]  [phone] - [facility]- [address] [place]  [phone] [facility]- [address]  [phone] - Renaye Rakers- [address] [place], [address], [phone]  Only accepts [place] patients after they have their name  applied to their card  Self Pay (no insurance) in [place]: - Sickle Cell Patients: Dr Willey Blade, [facility]  [address] [place], [phone] - [facility]- [address] [place]  [phone]       [facility] [place]- [address]       -     [facility]- see information above (Speak to [employer] if you do not have insurance)       -  [facility]- [address] [place], [phone]       -  [facility]- [address],  [phone]       -   [facility]- [address], [phone]       -  Dr Julio Sicks-  [address], [place], [phone]       -  [facility]- [address] [phone]       -  [facility]- [address], [phone], also [address], [phone]       -    [facility]- [address] [place], [phone], 1st & 3rd Saturday   every month, 10am-1pm  1) Find a Doctor and Pay Out of Pocket Although you won't have to find out who is covered by your insurance plan, it is a good idea to ask around and get recommendations. You will then need to call the office and see if the doctor you have chosen will accept you as a new patient and what types of options they offer for patients who are self-pay. Some doctors offer discounts or will set up payment plans for their patients who do not have insurance, but you will need to ask so you aren't surprised when you get to your appointment.  2) Contact Your Local Health Department Not all health departments have doctors that can see patients for sick visits, but many do, so it is worth a call to see if yours does. If you don't know where  your local health department is, you can check in your phone book. The CDC also has a tool to help you locate your state's health department, and many state websites also have listings of all of their local health departments.  3) Find a Walk-in Clinic If your illness is not likely to be very severe or complicated, you may want to try a walk in clinic. These are popping up all over the country in pharmacies, drugstores, and shopping centers. They're usually staffed by nurse practitioners or physician assistants that have been trained to treat common illnesses and complaints. They're usually fairly quick and inexpensive. However, if you have serious medical issues or chronic medical problems, these are probably not your best option  STD Testing - [facility] Department of [facility] [place], STD Clinic, [address], [place], phone [phone] or [phone].  Monday - Friday, call for an appointment. [facility] Department of [employer], STD Clinic, [place] [address], [place], phone [phone] or [phone].  Monday - Friday, call for an appointment.  Abuse/Neglect: [facility] Child Abuse Hotline 929-874-4546 [facility] Child Abuse Hotline 4013371722 (After Hours)  Emergency Shelter:  [facility] [phone]  Maternity Homes: - [facility] [place] [facility] [phone] - [facility] [phone]  MRSA Hotline #:   (770)314-3385  [facility] Resources  [facility] [place]  United Way [facility] Dept. [address]                 [address]         [address] [place] [address]  Blondell Reveal Phone:  [phone]                                  Phone:  [phone]                   Phone:  [phone]  [facility], [phone] - [phone]       -     [facility] in [place], [address]                                  2172393054, [facility] Child Abuse Hotline 2501723954 or [phone] (After Hours)   Behavioral Health Services  Substance Abuse Resources: - Alcohol and Drug Services  ([phone])[phone] - [facility] [phone] - [facility] [phone] Floyd[facility] Flock [phone] - Residential & Outpatient Substance Abuse Program  2062853777  Psychological Services: [facility]  [phone] Services  [phone] - [facility], [phone] [place]. [address] [place], ACCESS LINE: [phone] or [phone],  [employer]an.[employer]za  Dental Assistance  If unable to pay or uninsured, contact:  [facility] or [facility]  Health Dept. to become qualified for the adult dental clinic.  Patients with Medicaid: [facility] [phone] [address], [phone] [address] [phone]  If unable to pay, or uninsured, contact [facility] [phone]) or [facility] Department [phone] in [place], [phone] in [facility]) to become qualified for the adult dental clinic  Other Low-Cost Community Dental Services: - [facility]- [address] [place], [place], [place], [address], [phone], [phone], 2nd and 4th Thursday of the month at 6:30am.  10 clients each day by appointment, can sometimes see walk-in patients if someone does not show for an appointment. [facility]- [address] [place], [place], [address], [phone] - [facility]- [address] [place], [place], [address], [phone] [facility] Health Department- [phone] [facility] Health Department- [phone] [facility] [phone]            Substance Abuse Your exam indicates that you have a problem with substance abuse. Substance abuse is the misuse of alcohol or drugs that causes problems in family life, friendships, and work relationships. Substance abuse is the most important cause of premature illness, disability, and death in our society. It is also the greatest threat to a person's mental and spiritual well being. Substance abuse can start out in an innocent way, such as social drinking or taking a little extra medication prescribed by your doctor. No one starts out with the intention of becoming an alcoholic or an addict. Substance abuse victims cannot control their use of alcohol or drugs. They may become intoxicated daily or go on weekend binges. Often there is a strong desire to quit, but attempts  to stop using often fail. Encounters with law enforcement or conflicts with family members, friends, and work associates are signs of a potential problem. Recovery is always possible, although the craving for some drugs makes it difficult to quit without assistance. Many treatment programs are available to help people stop abusing alcohol or drugs. The first step in treatment is to admit you have a problem. This is a major hurdle because denial is a powerful force with substance abuse. Alcoholics Anonymous, Narcotics Anonymous, Cocaine Anonymous, and other recovery groups and programs can be very useful in helping people to quit. If you do not feel okay about your drug or alcohol use and if it is causing you trouble, we want to encourage you to talk about it with your doctor or with someone from a recovery group who can help you. You could also call the [employer] on Drug Abuse at 1-800-662-HELP. It is up to you to take the first step. AL-ANON and ALA-TEEN are support groups for friends and family members of an alcohol or drug dependent person. The people who love and care for the alcoholic or addicted person often need help, too. For information about these organizations, check your phone directory or call a local alcohol or drug treatment center. Document Released: 11/08/2004 Document Revised: 09/20/2011 Document Reviewed: 10/02/2008     Polysubstance Abuse When people abuse more than one drug or type of drug it is called polysubstance or polydrug abuse. For example, many smokers also drink alcohol. This is one form of polydrug abuse. Polydrug abuse also refers to the use of a drug to counteract an unpleasant effect produced by another drug. It may also be used to help with withdrawal from another drug. People who take stimulants may become agitated. Sometimes this agitation is countered  with a tranquilizer. This helps protect against the unpleasant side effects. Polydrug abuse also refers to the  use of different drugs at the same time.  Anytime drug use is interfering with normal living activities, it has become abuse. This includes problems with family and friends. Psychological dependence has developed when your mind tells you that the drug is needed. This is usually followed by physical dependence which has developed when continuing increases of drug are required to get the same feeling or "high". This is known as addiction or chemical dependency. A person's risk is much higher if there is a history of chemical dependency in the family. SIGNS OF CHEMICAL DEPENDENCY  You have been told by friends or family that drugs have become a problem.   You fight when using drugs.   You are having blackouts (not remembering what you do while using).   You feel sick from using drugs but continue using.   You lie about use or amounts of drugs (chemicals) used.   You need chemicals to get you going.   You are suffering in work performance or in school because of drug use.   You get sick from use of drugs but continue to use anyway.   You need drugs to relate to people or feel comfortable in social situations.   You use drugs to forget problems.  "Yes" answered to any of the above signs of chemical dependency indicates there are problems. The longer the use of drugs continues, the greater the problems will become. If there is a family history of drug or alcohol use, it is best not to experiment with these drugs. Continual use leads to tolerance. After tolerance develops more of the drug is needed to get the same feeling. This is followed by addiction. With addiction, drugs become the most important part of life. It becomes more important to take drugs than participate in the other usual activities of life. This includes relating to friends and family. Addiction is followed by dependency. Dependency is a condition where drugs are now needed not just to get high, but to feel normal. Addiction  cannot be cured but it can be stopped. This often requires outside help and the care of professionals. Treatment centers are listed in the yellow pages under: Cocaine, Narcotics, and Alcoholics Anonymous. Most hospitals and clinics can refer you to a specialized care center. Talk to your caregiver if you need help. Document Released: 05/23/2005 Document Revised: 09/20/2011 Document Reviewed: 10/01/2005 [facility] Patient Information 2012 [place], [place]. ExitCare Patient Information 2012 [place], [place].     Near-Syncope Near-syncope is sudden weakness, dizziness, or feeling like you might pass out (faint). This may occur when getting up after sitting or while standing for a long period of time. Near-syncope can be caused by a drop in blood pressure. This is a common reaction, but it may occur to a greater degree in people taking medicines to control their blood pressure. Fainting often occurs when the blood pressure or pulse is too low to provide enough blood flow to the brain to keep you conscious. Fainting and near-syncope are not usually due to serious medical problems. However, certain people should be more cautious in the event of near-syncope, including elderly patients, patients with diabetes, and patients with a history of heart conditions (especially irregular rhythms).  CAUSES   Drop in blood pressure.   Physical pain.   Dehydration.   Heat exhaustion.   Emotional distress.   Low blood sugar.   Internal bleeding.  Heart and circulatory problems.   Infections.  SYMPTOMS   Dizziness.   Feeling sick to your stomach (nauseous).   Nearly fainting.   Body numbness.   Turning pale.   Tunnel vision.   Weakness.  HOME CARE INSTRUCTIONS   Lie down right away if you start feeling like you might faint. Breathe deeply and steadily. Wait until all the symptoms have passed. Most of these episodes last only a few minutes. You may feel tired for several hours.   Drink enough  fluids to keep your urine clear or pale yellow.   If you are taking blood pressure or heart medicine, get up slowly, taking several minutes to sit and then stand. This can reduce dizziness that is caused by a drop in blood pressure.  SEEK IMMEDIATE MEDICAL CARE IF:   You have a severe headache.   Unusual pain develops in the chest, abdomen, or back.   There is bleeding from the mouth or rectum, or you have black or tarry stool.   An irregular heartbeat or a very rapid pulse develops.   You have repeated fainting or seizure-like jerking during an episode.   You faint when sitting or lying down.   You develop confusion.   You have difficulty walking.   Severe weakness develops.   Vision problems develop.  MAKE SURE YOU:   Understand these instructions.   Will watch your condition.   Will get help right away if you are not doing well or get worse.  Document Released: 10/01/2005 Document Revised: 09/20/2011 Document Reviewed: 11/17/2010 [facility] Patient Information 2012 [place], [place].

## 2012-04-07 NOTE — ED Notes (Signed)
Pt in by ems from courthouse. Asked someone to call 911 due to dizziness. Ems reports pt appears very sleepy. Pt c/o weakness and dizziness. Pt has small blue individual pill in pocket, reports this is trazodone, sts he has not taken anything today. Pt reports he uses cocaine, but has not used today. Also told ems he wanted to go to hospital for "help with his drug problem."

## 2012-04-07 NOTE — ED Notes (Addendum)
Pt reports being in court this am and "blacking out", states he smoked MJ and drank 2 40 oz beers before court. Pt reports after being brought here to ED and treated medically decided he would try detox tx again. Pt reports he has a court date in the am.

## 2012-04-07 NOTE — Progress Notes (Signed)
[facility] ED CM spoke with pt about no pcp. Pt confirms no pcp but goes to [facility] to see his psychiatrist on April 22 2012. Reports he is bipolar. Reports he had a court case this morning but requested to come to ED prior to the case because he was "sick" Reviewed and provided with list of self pay pcps Cm spoke with pt about need for npo until ED tests are completed Pt voiced understanding and appreciation of resources and services offer

## 2012-04-08 NOTE — Progress Notes (Signed)
[facility] ED CM consulted by ED [facility] RN to assist with chs indigent medications assistance.  CM spoke with Thayer Ohm in [place] pharmacy who confirms pt is eligible for chs indigent medication assistance.  CM updated ACT member and Charity fundraiser. Cm informed medications are needed for 15 days Pending Rx for pharmacy processing for non narcotic medications.

## 2012-04-08 NOTE — [facility] Counselor (Signed)
This Clinical research associate recv'd call from [facility] regarding patient acceptance.  Per Baird Lyons, pt declined for admission.  This Clinical research associate was told by pt that he had a court date in June, could not specify date.  This Clinical research associate was told by Baird Lyons that court date was yesterday.  This Clinical research associate told Baird Lyons that pt may have missed court date.  If pt missed court date, there may be a warrant issued for his arrest and [facility] cannot admit pt. Will have [facility] review pt for possible admission.

## 2012-04-08 NOTE — Progress Notes (Signed)
ED CM signing off [facility] RN informed Cm that pt left ED and has not returned Medication assistance not needed

## 2012-04-08 NOTE — ED Notes (Signed)
[name], sister of pt., called to ask about pt.'s status at [facility].  Inquired with ACT team, called pt's sister to explain the per [facility] request, pt. Would have to comply with what [facility]  Needed related to pt.'s court date.  Sister stated that she did call [facility] prior to calling us and they informed her that they needed documentation about his court date situation.  Explained to pt.'s sister that [facility] needed this documentation in order to have her brother treated.  Sister verbalized understanding.

## 2012-04-08 NOTE — ED Notes (Signed)
Pt. Was D/C'ed at [facility] at 1122 today.  About 1430 pt. Was brought back to [facility] by [facility] r/t: pt. Did not have home medications for 14 days.  No mention of pt. Needing medications sent to [facility] was reported in am report, only that pt. Was to go at 1000 (later changed to 1100 by [facility]).  [facility] did not call, just sent pt. By to [facility] with the medication message given to [facility] staff by [facility] driver.  Per ACT team, [facility]  Informed  Them that pt. Also had some court issues that needed clarification before he could return there.  Pt. Informed by Lucien Mons ACT team that he needed to call [facility] and answer court related questions to them.  Pt. Informed that once that was done and he could return to [facility], [facility] would work on filling his medications so he could return to [facility] today. Informed by Lucien Mons registration that pt. Walked out of lobby about 1500 and has not returned.

## 2012-04-08 NOTE — ED Provider Notes (Signed)
Pt requests detox.  Per nursing notes "Pt reports being in court this am and "blacking out", states he smoked MJ and drank 2 40 oz beers before court. Pt reports after being brought here to ED and treated medically decided he would try detox tx again. Pt reports he has a court date in the am"  Resting this am.    Filed Vitals:   04/08/12 0617  BP: 102/66  Pulse:   Temp: 97.3 F (36.3 C)  Resp: 16   Car RR.  Lungs CTA   Celene Kras, MD 04/08/12 [phone]
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
## 2012-04-08 NOTE — Progress Notes (Signed)
ED Cm consulted with EDP, Lynelle Doctor, Charge RN and ACT team. Assistance pending at this time

## 2012-04-10 ENCOUNTER — Emergency Department (HOSPITAL_COMMUNITY)
Admission: EM | Admit: 2012-04-10 | Discharge: 2012-04-11 | Disposition: A | Discharge status: Home / Self Care | Payer: Self-pay | Attending: Emergency Medicine | Admitting: Emergency Medicine

## 2012-04-10 ENCOUNTER — Encounter (HOSPITAL_COMMUNITY): Payer: Self-pay | Admitting: Emergency Medicine

## 2012-04-10 DIAGNOSIS — Z79899 Other long term (current) drug therapy: Secondary | ICD-10-CM | POA: Insufficient documentation

## 2012-04-10 DIAGNOSIS — F1410 Cocaine abuse, uncomplicated: Secondary | ICD-10-CM | POA: Insufficient documentation

## 2012-04-10 DIAGNOSIS — F1010 Alcohol abuse, uncomplicated: Secondary | ICD-10-CM | POA: Insufficient documentation

## 2012-04-10 DIAGNOSIS — F17200 Nicotine dependence, unspecified, uncomplicated: Secondary | ICD-10-CM | POA: Insufficient documentation

## 2012-04-10 LAB — URINE MICROSCOPIC-ADD ON

## 2012-04-10 LAB — RAPID URINE DRUG SCREEN, HOSP PERFORMED
Amphetamines: NOT DETECTED
Opiates: NOT DETECTED

## 2012-04-10 LAB — CBC WITH DIFFERENTIAL/PLATELET
Basophils Absolute: 0.1 10*3/uL (ref 0.0–0.1)
Basophils Relative: 1 % (ref 0–1)
Eosinophils Relative: 1 % (ref 0–5)
HCT: 44.6 % (ref 39.0–52.0)
MCHC: 34.5 g/dL (ref 30.0–36.0)
MCV: 92.3 fL (ref 78.0–100.0)
Monocytes Absolute: 0.7 10*3/uL (ref 0.1–1.0)
Platelets: 209 10*3/uL (ref 150–400)
RDW: 14.3 % (ref 11.5–15.5)

## 2012-04-10 LAB — URINALYSIS, ROUTINE W REFLEX MICROSCOPIC
Glucose, UA: NEGATIVE mg/dL
Protein, ur: NEGATIVE mg/dL
Specific Gravity, Urine: 1.026 (ref 1.005–1.030)
pH: 6.5 (ref 5.0–8.0)

## 2012-04-10 LAB — COMPREHENSIVE METABOLIC PANEL
AST: 25 U/L (ref 0–37)
Albumin: 3.6 g/dL (ref 3.5–5.2)
Calcium: 9.4 mg/dL (ref 8.4–10.5)
Creatinine, Ser: 0.99 mg/dL (ref 0.50–1.35)
GFR calc non Af Amer: >90 mL/min (ref >90)
Total Protein: 7.6 g/dL (ref 6.0–8.3)

## 2012-04-10 MED ORDER — CHLORDIAZEPOXIDE HCL 25 MG PO CAPS
25.0000 mg | ORAL_CAPSULE | ORAL | Status: DC
  Administered 2012-04-10: 25 mg via ORAL
  Filled 2012-04-10: qty 1

## 2012-04-10 MED ORDER — NICOTINE 21 MG/24HR TD PT24: 21.0000 mg | MEDICATED_PATCH | Freq: Every day | TRANSDERMAL | Status: DC

## 2012-04-10 MED ORDER — ONDANSETRON HCL 4 MG PO TABS: 4.0000 mg | ORAL_TABLET | Freq: Three times a day (TID) | ORAL | Status: DC | PRN

## 2012-04-10 MED ORDER — FOLIC ACID 1 MG PO TABS
1.0000 mg | ORAL_TABLET | Freq: Every day | ORAL | Status: DC
  Administered 2012-04-10: 1 mg via ORAL
  Filled 2012-04-10: qty 1

## 2012-04-10 MED ORDER — VITAMIN B-1 100 MG PO TABS
100.0000 mg | ORAL_TABLET | Freq: Every day | ORAL | Status: DC
  Administered 2012-04-10: 100 mg via ORAL
  Filled 2012-04-10: qty 1

## 2012-04-10 MED ORDER — ACETAMINOPHEN 325 MG PO TABS: 650.0000 mg | ORAL_TABLET | ORAL | Status: DC | PRN

## 2012-04-10 MED ORDER — LORAZEPAM 1 MG PO TABS: 1.0000 mg | ORAL_TABLET | Freq: Four times a day (QID) | ORAL | Status: DC | PRN

## 2012-04-10 MED ORDER — QUETIAPINE FUMARATE 100 MG PO TABS
100.0000 mg | ORAL_TABLET | Freq: Every day | ORAL | Status: DC
  Administered 2012-04-10: 100 mg via ORAL
  Filled 2012-04-10: qty 1

## 2012-04-10 MED ORDER — QUETIAPINE FUMARATE 50 MG PO TABS: 50.0000 mg | ORAL_TABLET | ORAL | Status: DC

## 2012-04-10 MED ORDER — ALUM & MAG HYDROXIDE-SIMETH 200-200-20 MG/5ML PO SUSP: 30.0000 mL | ORAL | Status: DC | PRN

## 2012-04-10 MED ORDER — IBUPROFEN 600 MG PO TABS: 600.0000 mg | ORAL_TABLET | Freq: Three times a day (TID) | ORAL | Status: DC | PRN

## 2012-04-10 MED ORDER — ZOLPIDEM TARTRATE 5 MG PO TABS
5.0000 mg | ORAL_TABLET | Freq: Every evening | ORAL | Status: DC | PRN
  Administered 2012-04-10: 5 mg via ORAL
  Filled 2012-04-10: qty 1

## 2012-04-10 MED ORDER — ADULT MULTIVITAMIN W/MINERALS CH
1.0000 | ORAL_TABLET | Freq: Every day | ORAL | Status: DC
  Administered 2012-04-10: 1 via ORAL
  Filled 2012-04-10: qty 1

## 2012-04-10 MED ORDER — LORAZEPAM 2 MG/ML IJ SOLN: 1.0000 mg | Freq: Four times a day (QID) | INTRAMUSCULAR | Status: DC | PRN

## 2012-04-10 MED ORDER — DIVALPROEX SODIUM ER 500 MG PO TB24
500.0000 mg | ORAL_TABLET | Freq: Two times a day (BID) | ORAL | Status: DC
  Administered 2012-04-10: 500 mg via ORAL
  Filled 2012-04-10 (×4): qty 1

## 2012-04-10 MED ORDER — THIAMINE HCL 100 MG/ML IJ SOLN: 100.0000 mg | Freq: Every day | INTRAMUSCULAR | Status: DC

## 2012-04-10 MED ORDER — HYDROXYZINE HCL 25 MG PO TABS
25.0000 mg | ORAL_TABLET | Freq: Every day | ORAL | Status: DC
  Administered 2012-04-10: 25 mg via ORAL
  Filled 2012-04-10: qty 1

## 2012-04-10 NOTE — ED Notes (Signed)
Patient was given two blankets,a remote for the tv,a sandwich and a soda

## 2012-04-10 NOTE — Progress Notes (Signed)
ED CM contacted by ACT team member and triage RN. ED CM will not be able to assist pt with medication assistance if he has not completed the tasks concerning his court proceeding prior to being accepted to a detox facility.  Pt stated he had his BH medications at his home during the last visit to [facility] ED.  Within the last 24 hrs pt had opportunity to obtain all medications required for admission to detox facility ED CM signing off

## 2012-04-10 NOTE — ED Notes (Signed)
Per pt states he was here 1 day ago for same issue-did not call resources that were provided for him-states off bipolar meds

## 2012-04-10 NOTE — ED Notes (Signed)
Two (2) Bags of belongings placed in locker 41 in the Psych ED lobby.
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
## 2012-04-10 NOTE — ED Provider Notes (Signed)
Medical screening examination/treatment/procedure(s) were performed by non-physician practitioner and as supervising physician I was immediately available for consultation/collaboration.    Nelia Shi, MD 04/10/12 [phone]

## 2012-04-11 ENCOUNTER — Emergency Department (HOSPITAL_COMMUNITY)
Admission: EM | Admit: 2012-04-11 | Discharge: 2012-04-11 | Disposition: A | Discharge status: Home / Self Care | Payer: Self-pay | Attending: Emergency Medicine | Admitting: Emergency Medicine

## 2012-04-11 ENCOUNTER — Encounter (HOSPITAL_COMMUNITY): Payer: Self-pay | Admitting: *Deleted

## 2012-04-11 DIAGNOSIS — F319 Bipolar disorder, unspecified: Secondary | ICD-10-CM | POA: Insufficient documentation

## 2012-04-11 DIAGNOSIS — Z79899 Other long term (current) drug therapy: Secondary | ICD-10-CM | POA: Insufficient documentation

## 2012-04-11 DIAGNOSIS — F17200 Nicotine dependence, unspecified, uncomplicated: Secondary | ICD-10-CM | POA: Insufficient documentation

## 2012-04-11 DIAGNOSIS — F39 Unspecified mood [affective] disorder: Secondary | ICD-10-CM

## 2012-04-11 DIAGNOSIS — Z008 Encounter for other general examination: Secondary | ICD-10-CM | POA: Insufficient documentation

## 2012-04-11 DIAGNOSIS — F1910 Other psychoactive substance abuse, uncomplicated: Secondary | ICD-10-CM | POA: Insufficient documentation

## 2012-04-11 MED ORDER — LORAZEPAM 1 MG PO TABS: 1.0000 mg | ORAL_TABLET | Freq: Three times a day (TID) | ORAL | Status: DC | PRN

## 2012-04-11 NOTE — ED Notes (Signed)
Patient discharge via ambulatory with steady gait. Patient denies SI and HI.

## 2012-04-11 NOTE — ED Notes (Signed)
1 patient belonging back put in locker 37
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
## 2012-04-11 NOTE — ED Provider Notes (Signed)
Act has evaluated. States no psychosis, delusions or hallucinations. No thoughts of harm to self or others. etoh 0, no withdrawal symptoms. Act has provided referrals for outpt resources, rec d/c.     Suzi Roots, MD 04/11/12 [phone]

## 2012-04-11 NOTE — Discharge Instructions (Signed)
Follow up with the [facility] later this morning.  You may also use resource guide provided. Also follow up with primary care doctor in coming week.  Return to ER if worse, new symptoms, other concern.       Substance Abuse Your exam indicates that you have a problem with substance abuse. Substance abuse is the misuse of alcohol or drugs that causes problems in family life, friendships, and work relationships. Substance abuse is the most important cause of premature illness, disability, and death in our society. It is also the greatest threat to a person's mental and spiritual well being. Substance abuse can start out in an innocent way, such as social drinking or taking a little extra medication prescribed by your doctor. No one starts out with the intention of becoming an alcoholic or an addict. Substance abuse victims cannot control their use of alcohol or drugs. They may become intoxicated daily or go on weekend binges. Often there is a strong desire to quit, but attempts to stop using often fail. Encounters with law enforcement or conflicts with family members, friends, and work associates are signs of a potential problem. Recovery is always possible, although the craving for some drugs makes it difficult to quit without assistance. Many treatment programs are available to help people stop abusing alcohol or drugs. The first step in treatment is to admit you have a problem. This is a major hurdle because denial is a powerful force with substance abuse. Alcoholics Anonymous, Narcotics Anonymous, Cocaine Anonymous, and other recovery groups and programs can be very useful in helping people to quit. If you do not feel okay about your drug or alcohol use and if it is causing you trouble, we want to encourage you to talk about it with your doctor or with someone from a recovery group who can help you. You could also call the [employer] on Drug Abuse at 1-800-662-HELP. It is up to you to take  the first step. AL-ANON and ALA-TEEN are support groups for friends and family members of an alcohol or drug dependent person. The people who love and care for the alcoholic or addicted person often need help, too. For information about these organizations, check your phone directory or call a local alcohol or drug treatment center. Document Released: 11/08/2004 Document Revised: 09/20/2011 Document Reviewed: 10/02/2008 [facility] Patient Information 2012 [place], [place].      Alcohol Problems Most adults who drink alcohol drink in moderation (not a lot) are at low risk for developing problems related to their drinking. However, all drinkers, including low-risk drinkers, should know about the health risks connected with drinking alcohol. RECOMMENDATIONS FOR LOW-RISK DRINKING  Drink in moderation. Moderate drinking is defined as follows:   Men - no more than 2 drinks per day.   Nonpregnant women - no more than 1 drink per day.   Over age 51 - no more than 1 drink per day.  A standard drink is 12 grams of pure alcohol, which is equal to a 12 ounce bottle of beer or wine cooler, a 5 ounce glass of wine, or 1.5 ounces of distilled spirits (such as whiskey, brandy, vodka, or rum).  ABSTAIN FROM (DO NOT DRINK) ALCOHOL:  When pregnant or considering pregnancy.   When taking a medication that interacts with alcohol.   If you are alcohol dependent.   A medical condition that prohibits drinking alcohol (such as ulcer, liver disease, or heart disease).  DISCUSS WITH YOUR CAREGIVER:  If you are at  risk for coronary heart disease, discuss the potential benefits and risks of alcohol use: Light to moderate drinking is associated with lower rates of coronary heart disease in certain populations (for example, men over age 66 and postmenopausal women). Infrequent or nondrinkers are advised not to begin light to moderate drinking to reduce the risk of coronary heart disease so as to avoid creating an  alcohol-related problem. Similar protective effects can likely be gained through proper diet and exercise.   Women and the elderly have smaller amounts of body water than men. As a result women and the elderly achieve a higher blood alcohol concentration after drinking the same amount of alcohol.   Exposing a fetus to alcohol can cause a broad range of birth defects referred to as Fetal Alcohol Syndrome (FAS) or Alcohol-Related Birth Defects (ARBD). Although FAS/ARBD is connected with excessive alcohol consumption during pregnancy, studies also have reported neurobehavioral problems in infants born to mothers reporting drinking an average of 1 drink per day during pregnancy.   Heavier drinking (the consumption of more than 4 drinks per occasion by men and more than 3 drinks per occasion by women) impairs learning (cognitive) and psychomotor functions and increases the risk of alcohol-related problems, including accidents and injuries.  CAGE QUESTIONS:   Have you ever felt that you should Cut down on your drinking?   Have people Annoyed you by criticizing your drinking?   Have you ever felt bad or Guilty about your drinking?   Have you ever had a drink first thing in the morning to steady your nerves or get rid of a hangover (Eye opener)?  If you answered positively to any of these questions: You may be at risk for alcohol-related problems if alcohol consumption is:   Men: Greater than 14 drinks per week or more than 4 drinks per occasion.   Women: Greater than 7 drinks per week or more than 3 drinks per occasion.  Do you or your family have a medical history of alcohol-related problems, such as:  Blackouts.   Sexual dysfunction.   Depression.   Trauma.   Liver dysfunction.   Sleep disorders.   Hypertension.   Chronic abdominal pain.   Has your drinking ever caused you problems, such as problems with your family, problems with your work (or school) performance, or  accidents/injuries?   Do you have a compulsion to drink or a preoccupation with drinking?   Do you have poor control or are you unable to stop drinking once you have started?   Do you have to drink to avoid withdrawal symptoms?   Do you have problems with withdrawal such as tremors, nausea, sweats, or mood disturbances?   Does it take more alcohol than in the past to get you high?   Do you feel a strong urge to drink?   Do you change your plans so that you can have a drink?   Do you ever drink in the morning to relieve the shakes or a hangover?  If you have answered a number of the previous questions positively, it may be time for you to talk to your caregivers, family, and friends and see if they think you have a problem. Alcoholism is a chemical dependency that keeps getting worse and will eventually destroy your health and relationships. Many alcoholics end up dead, impoverished, or in prison. This is often the end result of all chemical dependency.  Do not be discouraged if you are not ready to take action immediately.  Decisions to change behavior often involve up and down desires to change and feeling like you cannot decide.   Try to think more seriously about your drinking behavior.   Think of the reasons to quit.  WHERE TO GO FOR ADDITIONAL INFORMATION   The National Institute on Alcohol Abuse and Alcoholism (NIAAA)www.niaaa.nih.gov   [employer] on Alcoholism and Drug Dependence (NCADD)www.ncadd.org   American Society of Addiction Medicine (ASAM)www.https://anderson-Spindle.com/  Document Released: 10/01/2005 Document Revised: 09/20/2011 Document Reviewed: 05/19/2008 [facility] Patient Information 2012 [place], [place].     Mood Disorders Mood disorders are conditions that affect the way a person feels emotionally. The main mood disorders include:  Depression.   Bipolar disorder.   Dysthymia. Dysthymia is a mild, lasting (chronic) depression. Symptoms of dysthymia are similar to  depression, but not as severe.   Cyclothymia. Cyclothymia includes mood swings, but the highs and lows are not as severe as they are in bipolar disorder. Symptoms of cyclothymia are similar to those of bipolar disorder, but less extreme.  CAUSES  Mood disorders are probably caused by a combination of factors. People with mood disorders seem to have physical and chemical changes in their brains. Mood disorders run in families, so there may be genetic causes. Severe trauma or stressful life events may also increase the risk of mood disorders.  SYMPTOMS  Symptoms of mood disorders depend on the specific type of condition. Depression symptoms include:  Feeling sad, worthless, or hopeless.   Negative thoughts.   Inability to enjoy one's usual activities.   Low energy.   Sleeping too much or too little.   Appetite changes.   Crying.   Concentration problems.   Thoughts of harming oneself.  Bipolar disorder symptoms include:  Periods of depression (see above symptoms).   Mood swings, from sadness and depression, to abnormal elation and excitement.   Periods of mania:   Racing thoughts.   Fast speech.   Poor judgment, and careless, dangerous choices.   Decreased need for sleep.   Risky behavior.   Difficulty concentrating.   Irritability.   Increased energy.   Increased sex drive.  DIAGNOSIS  There are no blood tests or X-rays that can confirm a mood disorder. However, your caregiver may choose to run some tests to make sure that there is not another physical cause for your symptoms. A mood disorder is usually diagnosed after an in-depth interview with a caregiver. TREATMENT  Mood disorders can be treated with one or more of the following:  Medicine. This may include antidepressants, mood-stabilizers, or anti-psychotics.   Psychotherapy (talk therapy).   Cognitive behavioral therapy. You are taught to recognize negative thoughts and behavior patterns, and replace  them with healthy thoughts and behaviors.   Electroconvulsive therapy. For very severe cases of deep depression, a series of treatments in which an electrical current is applied to the brain.   Vagus nerve stimulation. A pulse of electricity is applied to a portion of the brain.   Transcranial magnetic stimulation. Powerful magnets are placed on the head that produce electrical currents.   Hospitalization. In severe situations, or when someone is having serious thoughts of harming him or herself, hospitalization may be necessary in order to keep the person safe. This is also done to quickly start and monitor treatment.  HOME CARE INSTRUCTIONS   Take your medicine exactly as directed.   Attend all of your therapy sessions.   Try to eat regular, healthy meals.   Exercise daily. Exercise may improve mood symptoms.  Get good sleep.   Do not drink alcohol or use pot or other drugs. These can worsen mood symptoms and cause anxiety and psychosis.   Tell your caregiver if you develop any side effects, such as feeling sick to your stomach (nauseous), dry mouth, dizziness, constipation, drowsiness, tremor, weight gain, or sexual symptoms. He or she may suggest things you can do to improve symptoms.   Learn ways to cope with the stress of having a chronic illness. This includes yoga, meditation, tai chi, or participating in a support group.   Drink enough water to keep your urine clear or pale yellow. Eat a high-fiber diet. These habits may help you avoid constipation from your medicine.  SEEK IMMEDIATE MEDICAL CARE IF:  Your mood worsens.   You have thoughts of hurting yourself or others.   You cannot care for yourself.   You develop the sensation of hearing or seeing something that is not actually present (auditory or visual hallucinations).   You develop abnormal thoughts.  Document Released: 07/29/2009 Document Revised: 09/20/2011 Document Reviewed: 07/29/2009 [facility] Patient  Information 2012 [place], [place].        RESOURCE GUIDE  Chronic Pain Problems: Contact [facility]  [phone] Patients need to be referred by their primary care doctor.  Insufficient Money for Medicine: Contact United Way:  call "211" or [facility] [phone].  No Primary Care Doctor: - Call Health Connect  [phone] - can help you locate a primary care doctor that  accepts your insurance, provides certain services, etc. - Physician Referral [phone]  Agencies that provide inexpensive medical care: - [facility]  [phone] - [facility]  [phone] - [facility]  [phone] [facility]  [phone] - Planned Parenthood  [phone] [facility]  [phone]  Medicaid-accepting [facility] Providers: - [facility]- [address]  [phone], Mon-Fri 9am-7pm, Sat 9am-1pm - [facility]- [address] [place], [address] [place]  [phone] - [facility]- [address], [address] [place]  [phone] [facility]- [address]  [phone] - Renaye Rakers- [address] [place], [address], [phone]  Only accepts [place] patients after they have their name  applied to their card  Self Pay (no insurance) in [place]: - Sickle Cell Patients: Dr Willey Blade, [facility]  [address] [place], [phone] - [facility]- [address] [place]  [phone]       [facility] [place]- [address]       -     [facility]- see information above (Speak to [employer] if you do not have insurance)       -  [facility]- [address] [place], [phone]       -  [facility]- [address],  [phone]       -  [facility]- [address], [phone]       -  Dr Julio Sicks-  [address], [address], [place],  [phone]       -  [facility]- [address], [phone]       -  [facility]- [address], [phone], also [address], [phone]       -  [facility]- [address] [place], [phone], 1st & 3rd Saturday   every month, 10am-1pm  1) Find a Doctor and Pay Out of Pocket Although you won't have to find out who is covered by your insurance plan, it is a good idea to ask around and get recommendations. You will then need to call the office and see if the doctor you have chosen will accept you as a new patient and what types of options they offer for patients who are self-pay. Some doctors offer discounts or will set up payment plans for their patients who do not have insurance, but you will need to ask so you aren't surprised when you get to your appointment.  2) Contact Your Local Health Department Not all health departments have doctors that can see patients for sick visits, but many do, so it is worth a call to see if yours does. If you don't know where your local health department is, you can check in your phone book. The CDC also has a tool to help you locate your state's health department, and many state websites also have listings of all of their local health departments.  3) Find a Walk-in Clinic If your illness is not likely to be very severe or complicated, you may want to try a walk in clinic. These are popping up all over the country in pharmacies, drugstores, and shopping centers. They're usually staffed by nurse practitioners or physician assistants that have been trained to treat common illnesses and complaints. They're usually fairly quick and inexpensive. However, if you have serious medical issues or chronic medical problems, these are probably not your best option  STD Testing - [facility] of [facility], STD Clinic, [address] [place], phone [phone] or [phone].  Monday - Friday, call for  an appointment. [facility] of [employer], STD Clinic, [address], [place], phone [phone] or [phone].  Monday - Friday, call for an appointment.  Abuse/Neglect: [facility] Child Abuse Hotline 725-007-0815 [facility] Child Abuse Hotline 7402927230 (After Hours)  Emergency Shelter:  [facility] (615)[phone]  Maternity Homes: - [facility] at [facility] [place] [facility] [phone] - [facility] [phone]  MRSA Hotline #:   587-049-1736  [facility] Resources  [facility] [place]  United Way [facility] Dept. [address]                 [address]         [address] [place] [address]  Blondell Reveal Phone:  [phone]                                  Phone:  [phone]                   Phone:  [phone]  [facility], [phone] - [facility] -  [facility]- 9014340302       -     [facility] in [place], [address]                                  [phone], [facility] Child Abuse Hotline 8674945053 or 351-454-2061 (After Hours)   Behavioral Health Services  Substance Abuse Resources: - [facility] Drug Services  ([phone] - [facility] [phone] - [facility] [place] [phone] Floydene Flock [phone] - Residential & Outpatient Substance Abuse Program  [phone]  Psychological Services: [facility]  [phone] [facility]  [phone] - [facility], [phone] [place]. [address] [place], ACCESS LINE: [phone] or [phone], [employer]urLoan.co.za  Dental Assistance  If unable to pay or uninsured, contact:  [facility] or [facility]. to become qualified  for the adult dental clinic.  Patients with Medicaid: [facility] [phone] [address], [phone] [address] [phone]  If unable to pay, or uninsured, contact [facility] [phone]) or [facility] Department [phone] in [place], [phone] in [facility]) to become qualified for the adult dental clinic  Other Low-Cost Community Dental Services: - [facility]- [address] [place], [place], [place], [address], [phone], [address]. [address], 2nd and 4th Thursday of the month at 6:30am.  10 clients each day by appointment, can sometimes see walk-in patients if someone does not show for an appointment. [facility]- [address] [place], [place], [address], [phone] - [facility]- [address], [place], [place], [address], [phone] - [facility]- [phone] [facility]- [phone] [facility] Department- [phone]

## 2012-04-11 NOTE — ED Notes (Signed)
Telepsych completed.  

## 2012-04-11 NOTE — Discharge Instructions (Signed)
Use resource guide provided by behavioral health team.  Follow up at [facility] tomorrow.  Also follow up with primary care doctor in coming week.      Alcohol Problems Most adults who drink alcohol drink in moderation (not a lot) are at low risk for developing problems related to their drinking. However, all drinkers, including low-risk drinkers, should know about the health risks connected with drinking alcohol. RECOMMENDATIONS FOR LOW-RISK DRINKING  Drink in moderation. Moderate drinking is defined as follows:   Men - no more than 2 drinks per day.   Nonpregnant women - no more than 1 drink per day.   Over age 48 - no more than 1 drink per day.  A standard drink is 12 grams of pure alcohol, which is equal to a 12 ounce bottle of beer or wine cooler, a 5 ounce glass of wine, or 1.5 ounces of distilled spirits (such as whiskey, brandy, vodka, or rum).  ABSTAIN FROM (DO NOT DRINK) ALCOHOL:  When pregnant or considering pregnancy.   When taking a medication that interacts with alcohol.   If you are alcohol dependent.   A medical condition that prohibits drinking alcohol (such as ulcer, liver disease, or heart disease).  DISCUSS WITH YOUR CAREGIVER:  If you are at risk for coronary heart disease, discuss the potential benefits and risks of alcohol use: Light to moderate drinking is associated with lower rates of coronary heart disease in certain populations (for example, men over age 52 and postmenopausal women). Infrequent or nondrinkers are advised not to begin light to moderate drinking to reduce the risk of coronary heart disease so as to avoid creating an alcohol-related problem. Similar protective effects can likely be gained through proper diet and exercise.   Women and the elderly have smaller amounts of body water than men. As a result women and the elderly achieve a higher blood alcohol concentration after drinking the same amount of alcohol.   Exposing a fetus to alcohol  can cause a broad range of birth defects referred to as Fetal Alcohol Syndrome (FAS) or Alcohol-Related Birth Defects (ARBD). Although FAS/ARBD is connected with excessive alcohol consumption during pregnancy, studies also have reported neurobehavioral problems in infants born to mothers reporting drinking an average of 1 drink per day during pregnancy.   Heavier drinking (the consumption of more than 4 drinks per occasion by men and more than 3 drinks per occasion by women) impairs learning (cognitive) and psychomotor functions and increases the risk of alcohol-related problems, including accidents and injuries.  CAGE QUESTIONS:   Have you ever felt that you should Cut down on your drinking?   Have people Annoyed you by criticizing your drinking?   Have you ever felt bad or Guilty about your drinking?   Have you ever had a drink first thing in the morning to steady your nerves or get rid of a hangover (Eye opener)?  If you answered positively to any of these questions: You may be at risk for alcohol-related problems if alcohol consumption is:   Men: Greater than 14 drinks per week or more than 4 drinks per occasion.   Women: Greater than 7 drinks per week or more than 3 drinks per occasion.  Do you or your family have a medical history of alcohol-related problems, such as:  Blackouts.   Sexual dysfunction.   Depression.   Trauma.   Liver dysfunction.   Sleep disorders.   Hypertension.   Chronic abdominal pain.   Has your drinking  ever caused you problems, such as problems with your family, problems with your work (or school) performance, or accidents/injuries?   Do you have a compulsion to drink or a preoccupation with drinking?   Do you have poor control or are you unable to stop drinking once you have started?   Do you have to drink to avoid withdrawal symptoms?   Do you have problems with withdrawal such as tremors, nausea, sweats, or mood disturbances?   Does it take  more alcohol than in the past to get you high?   Do you feel a strong urge to drink?   Do you change your plans so that you can have a drink?   Do you ever drink in the morning to relieve the shakes or a hangover?  If you have answered a number of the previous questions positively, it may be time for you to talk to your caregivers, family, and friends and see if they think you have a problem. Alcoholism is a chemical dependency that keeps getting worse and will eventually destroy your health and relationships. Many alcoholics end up dead, impoverished, or in prison. This is often the end result of all chemical dependency.  Do not be discouraged if you are not ready to take action immediately.   Decisions to change behavior often involve up and down desires to change and feeling like you cannot decide.   Try to think more seriously about your drinking behavior.   Think of the reasons to quit.  WHERE TO GO FOR ADDITIONAL INFORMATION   The National Institute on Alcohol Abuse and Alcoholism (NIAAA)www.niaaa.nih.gov   [employer] on Alcoholism and Drug Dependence (NCADD)www.ncadd.org   American Society of Addiction Medicine (ASAM)www.https://anderson-Standage.com/  Document Released: 10/01/2005 Document Revised: 09/20/2011 Document Reviewed: 05/19/2008 [facility] Patient Information 2012 [place], [place].       Cocaine Abuse and Chemical Dependency WHEN IS DRUG USE A PROBLEM? Anytime drug use is interfering with normal living activities it has become abuse. This includes problems with family and friends. Psychological dependence has developed when your mind tells you that the drug is needed. This is usually followed by physical dependence which has developed when continuing increases of drug are required to get the same feeling or "high". This is known as addiction or chemical dependency. A person's risk is much higher if there is a history of chemical dependency in the family. SIGNS OF CHEMICAL  DEPENDENCY:  Been told by friends or family that drugs have become a problem.   Fighting when using drugs.   Having blackouts (not remembering what you do while using).   Feel sick from using drugs but continue using.   Lie about use or amounts of drugs (chemicals) used.   Need chemicals to get you going.   Suffer in work International aid/development worker or school because of drug use.   Get sick from use of drugs but continue to use anyway.   Need drugs to relate to people or feel comfortable in social situations.   Use drugs to forget problems.  Yes answered to any of the above signs of chemical dependency indicates there are problems. The longer the use of drugs continues, the greater the problems will become. If there is a family history of drug or alcohol use it is best not to experiment with these drugs. Experimentation leads to tolerance and needing to use more of the drug to get the same feeling. This is followed by addiction where drugs become the most important part of life.  It becomes more important to take drugs than participate in the other usual activities of life including relating to friends and family. Addiction is followed by dependency where drugs are now needed not just to get high but to feel normal. Addiction cannot be cured but it can be stopped. This often requires outside help and the care of professionals. Treatment centers are listed in the yellow pages under: Cocaine, Narcotics, and Alcoholics Anonymous. Most hospitals and clinics can refer you to a specialized care center. WHAT IS COCAINE? Cocaine is a strong nervous system stimulant which speeds up the body and gives the user the feeling that they have increased energy, loss of appetite and feelings of great pleasure. This "high" which begins within several minutes and lasts for less than an hour is followed by a "crash". The crash and depressed feelings that come with it cause a craving for the drug to regain the high. HOW IS  COCANINE USED? Cocaine is snorted, injected, and smoked as free- base or crack. Because smoking the drug produces a greater high it is also associated with a greater low. It is therefore more rapidly addicting. WHAT ARE THE EFFECTS OF COCAINE? It is an anesthetic (pain killer) and a stimulant (it causes a high which gives a false feeling of well being). It increases heart and breathing rates with increases in body temperature and blood pressure. It removes appetite. It causes seizures (convulsions) along with nausea (feeling sick to your stomach), vomiting and stomach pain. This dangerous combination can lead to death. Trying to keep the high feeling leads to greater and greater drug use and this leads to addiction. Addiction can only be helped by stopping use of all chemicals. This is hard but may save your life. If the addiction is continued, the only possible outcome is loss of self respect and self esteem, violence, death, and eventually prison if the addict is fortunate enough to be caught and able to receive help prior to this last life ending event. OTHER HEALTH RISKS OF COCAINE AND ALL DRUG USE ARE:  The increased possibility of getting AIDS or hepatitis (liver inflammation).   Having a baby born which is addicted to cocaine and must go through painful withdrawal including shaking, jerking, and crying in pain. Many of the babies die. Other babies go through life with lifelong disabilities and learning problems.  HOW TO STAY DRUG FREE ONCE YOU HAVE QUIT USING:  Develop healthy activities and form friends who do not use drugs.   Stay away from the drug scene.   Tell the pusher or former friend you have other better things to do.   Have ready excuses available about why you cannot use.  For more help or information contact your local physician, clinic, hospital or dial 1-800-cocaine (901) 552-2668). Document Released: 09/28/2000 Document Revised: 09/20/2011 Document Reviewed:  05/19/2008 [facility] Patient Information 2012 [place], [place].      RESOURCE GUIDE  Chronic Pain Problems: Contact [facility]  [phone] Patients need to be referred by their primary care doctor.  Insufficient Money for Medicine: Contact United Way:  call "211" or [facility] 8541249501.  No Primary Care Doctor: - Call Health Connect  [phone] - can help you locate a primary care doctor that  accepts your insurance, provides certain services, etc. - Physician Referral Service- [phone]  Agencies that provide inexpensive medical care: - [facility]  [phone] - [facility]  [phone] - [facility]  [phone] - Women's Clinic  [phone] - Planned Parenthood  [phone] [facility]  [phone]  Medicaid-accepting [facility] Providers: - [facility]- [address]  [phone], Mon-Fri 9am-7pm, Sat 9am-1pm - [facility]- [address] [place], [place]  [phone] - [facility]- [address] [place]  [phone] [facility]- [address]  [phone] - Renaye Rakers- [address] [place], [address], [phone]  Only accepts [place] patients after they have their name  applied to their card  Self Pay (no insurance) in [place]: - Sickle Cell Patients: Dr Willey Blade, [facility]  [address] [place], [phone] - [facility]gent Care- [address] [place]  [phone]       [facility]- [address]       -     [facility]- see information above (Speak to [employer] if you do not have insurance)       -  [facility]- [address] [place], [phone]       -  [facility]- [address],  [phone]       -  [facility]- [address], [phone]       -  Dr Julio Sicks-  [address]  Dr, [address], [place], [phone]       -  [facility] Urgent Care- [address] [phone]       -  [facility]- [address] [phone], also [address], [phone]       -    [facility]- [address] [place], [phone], 1st & 3rd Saturday   every month, 10am-1pm  1) Find a Doctor and Pay Out of Pocket Although you won't have to find out who is covered by your insurance plan, it is a good idea to ask around and get recommendations. You will then need to call the office and see if the doctor you have chosen will accept you as a new patient and what types of options they offer for patients who are self-pay. Some doctors offer discounts or will set up payment plans for their patients who do not have insurance, but you will need to ask so you aren't surprised when you get to your appointment.  2) Contact Your Local Health Department Not all health departments have doctors that can see patients for sick visits, but many do, so it is worth a call to see if yours does. If you don't know where your local health department is, you can check in your phone book. The CDC also has a tool to help you locate your state's health department, and many state websites also have listings of all of their local health departments.  3) Find a Walk-in Clinic If your illness is not likely to be very severe or complicated, you may want to try a walk in clinic. These are popping up all over the country in pharmacies, drugstores, and shopping centers. They're usually staffed by nurse practitioners or physician assistants that have been trained to treat common illnesses and complaints. They're usually fairly quick and inexpensive. However, if you have serious medical issues or chronic medical problems, these are probably not your best option  STD Testing - [facility] Department of [employer]  [facility], STD Clinic, [address], [place], phone [phone] or [phone].   Monday - Friday, call for an appointment. [facility] Department of [employer], STD Clinic, [address], [place], phone [phone] or [phone].  Monday - Friday, call for an appointment.  Abuse/Neglect: [facility] Child Abuse Hotline 586-022-5207 [facility] Child Abuse Hotline 6058242280 (After Hours)  Emergency Shelter:  [facility] 812-642-1206  Maternity Homes: - [facility] [place] [facility] [phone] - [facility] [phone]  MRSA Hotline #:   346-412-8856  [facility]  [facility] [place]  United Way [facility] Dept. [address]                 [address]         [address] [place] [address]  Blondell Reveal Phone:  [phone]                                  Phone:  [phone]                   Phone:  [phone]  [facility], [phone] - [phone]       -     [facility] in [place], [address]                                  [phone], [facility] Child Abuse Hotline 580-578-7790 or [phone] (After Hours)   Behavioral Health Services  Substance Abuse Resources: - Alcohol and Drug Services  [phone] - [facility] [phone] - [facility] [phone] Floydene Flock ([phone])[phone] - Residential & Outpatient Substance Abuse Program  [phone]  Psychological Services: [facility]  [phone] Services  [phone] - [facility], [phone] [place]. [address] [place], ACCESS LINE: (562)060-7965 or 4072359613, EntrepreneurLoan.co.za  Dental Assistance  If unable to pay or uninsured, contact:  [facility] or [facility]. to become qualified for the adult dental clinic.  Patients with Medicaid: [facility] [phone] [address], [phone] [address], [phone]  If unable to pay, or uninsured, contact [facility] [phone]) or [facility] Department [phone] in [place], [phone] in [facility]) to become qualified for the adult dental clinic  Other Low-Cost Community Dental Services: - [facility]- [address] [place], [place], [place], [address], [phone], Ext. [phone], 2nd and 4th Thursday of the month at 6:30am.  10 clients each day by appointment, can sometimes see walk-in patients if someone does not show for an appointment. [facility] [address] [place], [place], [address], [phone] - [facility]- [address] [place], [place], [address], [phone] -  [facility] Health Department- [phone] - [facility] Health Department- [phone] [facility] [phone]

## 2012-04-11 NOTE — ED Notes (Signed)
Pt states he was not treated for his mental problems. Pt sates he is hi. Pt states " I want to see a doctor."

## 2012-10-04 ENCOUNTER — Encounter (HOSPITAL_COMMUNITY): Payer: Self-pay | Admitting: *Deleted

## 2012-10-04 ENCOUNTER — Emergency Department (HOSPITAL_COMMUNITY): Payer: Self-pay

## 2012-10-04 ENCOUNTER — Emergency Department (HOSPITAL_COMMUNITY)
Admission: EM | Admit: 2012-10-04 | Discharge: 2012-10-05 | Disposition: A | Discharge status: Home / Self Care | Payer: Self-pay | Attending: Emergency Medicine | Admitting: Emergency Medicine

## 2012-10-04 DIAGNOSIS — R059 Cough, unspecified: Secondary | ICD-10-CM | POA: Insufficient documentation

## 2012-10-04 DIAGNOSIS — Z87828 Personal history of other (healed) physical injury and trauma: Secondary | ICD-10-CM | POA: Insufficient documentation

## 2012-10-04 DIAGNOSIS — F319 Bipolar disorder, unspecified: Secondary | ICD-10-CM | POA: Insufficient documentation

## 2012-10-04 DIAGNOSIS — F1210 Cannabis abuse, uncomplicated: Secondary | ICD-10-CM | POA: Insufficient documentation

## 2012-10-04 DIAGNOSIS — M545 Low back pain, unspecified: Secondary | ICD-10-CM | POA: Insufficient documentation

## 2012-10-04 DIAGNOSIS — R0789 Other chest pain: Secondary | ICD-10-CM | POA: Insufficient documentation

## 2012-10-04 DIAGNOSIS — J069 Acute upper respiratory infection, unspecified: Secondary | ICD-10-CM | POA: Insufficient documentation

## 2012-10-04 DIAGNOSIS — G8929 Other chronic pain: Secondary | ICD-10-CM | POA: Insufficient documentation

## 2012-10-04 DIAGNOSIS — R05 Cough: Secondary | ICD-10-CM | POA: Insufficient documentation

## 2012-10-04 DIAGNOSIS — Z79899 Other long term (current) drug therapy: Secondary | ICD-10-CM | POA: Insufficient documentation

## 2012-10-04 DIAGNOSIS — F1021 Alcohol dependence, in remission: Secondary | ICD-10-CM | POA: Insufficient documentation

## 2012-10-04 DIAGNOSIS — F1410 Cocaine abuse, uncomplicated: Secondary | ICD-10-CM | POA: Insufficient documentation

## 2012-10-04 DIAGNOSIS — F17200 Nicotine dependence, unspecified, uncomplicated: Secondary | ICD-10-CM | POA: Insufficient documentation

## 2012-10-04 LAB — CBC WITH DIFFERENTIAL/PLATELET
Eosinophils Absolute: 0 10*3/uL (ref 0.0–0.7)
Hemoglobin: 13.3 g/dL (ref 13.0–17.0)
Lymphocytes Relative: 12 % (ref 12–46)
Lymphs Abs: 0.8 10*3/uL (ref 0.7–4.0)
MCH: 30.7 pg (ref 26.0–34.0)
MCV: 90.8 fL (ref 78.0–100.0)
Monocytes Relative: 16 % — ABNORMAL HIGH (ref 3–12)
Neutrophils Relative %: 71 % (ref 43–77)
RBC: 4.33 MIL/uL (ref 4.22–5.81)
WBC: 6.4 10*3/uL (ref 4.0–10.5)

## 2012-10-04 LAB — COMPREHENSIVE METABOLIC PANEL
AST: 20 U/L (ref 0–37)
Albumin: 3.5 g/dL (ref 3.5–5.2)
Calcium: 8.8 mg/dL (ref 8.4–10.5)
Creatinine, Ser: 1.32 mg/dL (ref 0.50–1.35)
GFR calc non Af Amer: 66 mL/min — ABNORMAL LOW (ref >90)

## 2012-10-04 MED ORDER — ACETAMINOPHEN 325 MG PO TABS
650.0000 mg | ORAL_TABLET | Freq: Once | ORAL | Status: AC
  Administered 2012-10-04: 650 mg via ORAL
  Filled 2012-10-04: qty 2

## 2012-10-04 MED ORDER — ALBUTEROL SULFATE (5 MG/ML) 0.5% IN NEBU
2.5000 mg | INHALATION_SOLUTION | Freq: Once | RESPIRATORY_TRACT | Status: AC
  Administered 2012-10-04: 2.5 mg via RESPIRATORY_TRACT
  Filled 2012-10-04: qty 0.5

## 2012-10-04 NOTE — ED Notes (Signed)
PT reports fever and productive cough starting today.

## 2012-10-04 NOTE — ED Notes (Signed)
Pt states he can't sleep due to his cough, and that he is running fever.

## 2012-10-05 MED ORDER — OXYCODONE-ACETAMINOPHEN 5-325 MG PO TABS
1.0000 | ORAL_TABLET | Freq: Once | ORAL | Status: AC
  Administered 2012-10-05: 1 via ORAL
  Filled 2012-10-05: qty 1

## 2012-10-05 MED ORDER — DEXTROMETHORPHAN POLISTIREX 30 MG/5ML PO LQCR: 60.0000 mg | ORAL | Status: DC | PRN
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
## 2012-10-05 NOTE — ED Provider Notes (Signed)
Medical screening examination/treatment/procedure(s) were performed by non-physician practitioner and as supervising physician I was immediately available for consultation/collaboration.   Carleene [name] III, MD 10/05/12 [phone]

## 2012-10-05 NOTE — ED Notes (Signed)
Pt A.O. X 4.  Vitals stable. Respirations even and regular. Cough present. Productive with yellow sputum. Denies SOB. Denies pain. Denies N/V/D. Ambulatory with no assistance. Verbalized understand of prescriptions and administration. No further questions at this time.

## 2013-02-04 ENCOUNTER — Emergency Department (HOSPITAL_COMMUNITY): Payer: Self-pay

## 2013-02-04 ENCOUNTER — Emergency Department (HOSPITAL_COMMUNITY)
Admission: EM | Admit: 2013-02-04 | Discharge: 2013-02-05 | Disposition: A | Discharge status: Other Acute Inpatient Hospital | Payer: Self-pay | Attending: Emergency Medicine | Admitting: Emergency Medicine

## 2013-02-04 ENCOUNTER — Encounter (HOSPITAL_COMMUNITY): Payer: Self-pay

## 2013-02-04 DIAGNOSIS — M791 Myalgia, unspecified site: Secondary | ICD-10-CM

## 2013-02-04 DIAGNOSIS — F1020 Alcohol dependence, uncomplicated: Secondary | ICD-10-CM | POA: Insufficient documentation

## 2013-02-04 DIAGNOSIS — Z91199 Patient's noncompliance with other medical treatment and regimen due to unspecified reason: Secondary | ICD-10-CM | POA: Insufficient documentation

## 2013-02-04 DIAGNOSIS — Z5900 Homelessness unspecified: Secondary | ICD-10-CM | POA: Insufficient documentation

## 2013-02-04 DIAGNOSIS — R197 Diarrhea, unspecified: Secondary | ICD-10-CM | POA: Insufficient documentation

## 2013-02-04 DIAGNOSIS — IMO0001 Reserved for inherently not codable concepts without codable children: Secondary | ICD-10-CM | POA: Insufficient documentation

## 2013-02-04 DIAGNOSIS — M545 Low back pain, unspecified: Secondary | ICD-10-CM | POA: Insufficient documentation

## 2013-02-04 DIAGNOSIS — Z79899 Other long term (current) drug therapy: Secondary | ICD-10-CM | POA: Insufficient documentation

## 2013-02-04 DIAGNOSIS — Z9119 Patient's noncompliance with other medical treatment and regimen: Secondary | ICD-10-CM

## 2013-02-04 DIAGNOSIS — Z87828 Personal history of other (healed) physical injury and trauma: Secondary | ICD-10-CM | POA: Insufficient documentation

## 2013-02-04 DIAGNOSIS — F1910 Other psychoactive substance abuse, uncomplicated: Secondary | ICD-10-CM | POA: Insufficient documentation

## 2013-02-04 DIAGNOSIS — R109 Unspecified abdominal pain: Secondary | ICD-10-CM | POA: Insufficient documentation

## 2013-02-04 DIAGNOSIS — G8929 Other chronic pain: Secondary | ICD-10-CM | POA: Insufficient documentation

## 2013-02-04 DIAGNOSIS — R112 Nausea with vomiting, unspecified: Secondary | ICD-10-CM | POA: Insufficient documentation

## 2013-02-04 DIAGNOSIS — F17200 Nicotine dependence, unspecified, uncomplicated: Secondary | ICD-10-CM | POA: Insufficient documentation

## 2013-02-04 LAB — BASIC METABOLIC PANEL
BUN: 18 mg/dL (ref 6–23)
CO2: 23 mEq/L (ref 19–32)
GFR calc non Af Amer: >90 mL/min (ref >90)
Glucose, Bld: 89 mg/dL (ref 70–99)
Potassium: 4.5 mEq/L (ref 3.5–5.1)
Sodium: 133 mEq/L — ABNORMAL LOW (ref 135–145)

## 2013-02-04 LAB — RAPID URINE DRUG SCREEN, HOSP PERFORMED
Opiates: NOT DETECTED
Tetrahydrocannabinol: POSITIVE — AB

## 2013-02-04 LAB — URINALYSIS, ROUTINE W REFLEX MICROSCOPIC
Ketones, ur: 40 mg/dL — AB
Protein, ur: 100 mg/dL — AB
Urobilinogen, UA: 4 mg/dL — ABNORMAL HIGH (ref 0.0–1.0)

## 2013-02-04 LAB — CBC WITH DIFFERENTIAL/PLATELET
Eosinophils Absolute: 0.1 10*3/uL (ref 0.0–0.7)
Hemoglobin: 17 g/dL (ref 13.0–17.0)
Lymphocytes Relative: 6 % — ABNORMAL LOW (ref 12–46)
Lymphs Abs: 0.5 10*3/uL — ABNORMAL LOW (ref 0.7–4.0)
MCH: 31.3 pg (ref 26.0–34.0)
MCV: 89.2 fL (ref 78.0–100.0)
Monocytes Relative: 6 % (ref 3–12)
Neutrophils Relative %: 87 % — ABNORMAL HIGH (ref 43–77)
Platelets: 107 10*3/uL — ABNORMAL LOW (ref 150–400)
RBC: 5.44 MIL/uL (ref 4.22–5.81)
WBC: 8.9 10*3/uL (ref 4.0–10.5)

## 2013-02-04 LAB — URINE MICROSCOPIC-ADD ON

## 2013-02-04 MED ORDER — HYDROMORPHONE HCL PF 2 MG/ML IJ SOLN
2.0000 mg | Freq: Once | INTRAMUSCULAR | Status: DC
  Filled 2013-02-04: qty 1

## 2013-02-04 MED ORDER — NICOTINE 21 MG/24HR TD PT24: 21.0000 mg | MEDICATED_PATCH | Freq: Every day | TRANSDERMAL | Status: DC | PRN

## 2013-02-04 MED ORDER — ONDANSETRON HCL 4 MG PO TABS: 4.0000 mg | ORAL_TABLET | Freq: Three times a day (TID) | ORAL | Status: DC | PRN

## 2013-02-04 MED ORDER — IBUPROFEN 600 MG PO TABS
600.0000 mg | ORAL_TABLET | Freq: Three times a day (TID) | ORAL | Status: DC | PRN
  Administered 2013-02-04: 600 mg via ORAL
  Filled 2013-02-04: qty 3

## 2013-02-04 MED ORDER — ONDANSETRON 8 MG PO TBDP
8.0000 mg | ORAL_TABLET | Freq: Once | ORAL | Status: DC
  Filled 2013-02-04: qty 1

## 2013-02-04 MED ORDER — QUETIAPINE FUMARATE ER 300 MG PO TB24
300.0000 mg | ORAL_TABLET | Freq: Every day | ORAL | Status: DC
  Administered 2013-02-04: 300 mg via ORAL
  Filled 2013-02-04 (×2): qty 1

## 2013-02-04 MED ORDER — VALPROIC ACID 250 MG PO CAPS
250.0000 mg | ORAL_CAPSULE | Freq: Two times a day (BID) | ORAL | Status: DC
  Administered 2013-02-04 – 2013-02-05 (×2): 250 mg via ORAL
  Filled 2013-02-04 (×4): qty 1

## 2013-02-04 MED ORDER — ZOLPIDEM TARTRATE 5 MG PO TABS: 5.0000 mg | ORAL_TABLET | Freq: Every evening | ORAL | Status: DC | PRN

## 2013-02-04 MED ORDER — ALUM & MAG HYDROXIDE-SIMETH 200-200-20 MG/5ML PO SUSP: 30.0000 mL | ORAL | Status: DC | PRN

## 2013-02-04 MED ORDER — ACETAMINOPHEN 325 MG PO TABS
650.0000 mg | ORAL_TABLET | ORAL | Status: DC | PRN
  Administered 2013-02-05: 650 mg via ORAL
  Filled 2013-02-04: qty 2

## 2013-02-04 NOTE — BHH Counselor (Signed)
Writer met with patient, per request of EDP-Dr. Effie Shy to discuss substance abuse treatment options. Dr. Effie Shy explained that patient does not need detox. His last use of alcohol and cocaine was this past Saturday 01/31/2013 ( 4-5 days ago).  Writer met with patient whom explained that he was physically sick. Writer informed him that the medical providers would address his medical issues. However, this Clinical research associate offered patient community substance abuse referrals to [place], [facility] for CD-IOP, AA for a support group, and [facility] [place] for individual therapy.

## 2013-02-04 NOTE — ED Notes (Signed)
Per pt, wants detox, ran out of bipolar meds 2 weeks ago-c/o N/V/D and abdominal cramping-no ETOH/THC/cocanine in 4 days-requesting behavioral health

## 2013-02-04 NOTE — BHH Counselor (Signed)
Per Lendon Collar, [facility] there are no 400 hall male beds available.
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
## 2013-02-04 NOTE — ED Provider Notes (Signed)
5:07 PM Dr. Leretha Pol recommends inpatient psych placement.  Meds adjusted, per her recommendations.  Gerhard Munch, MD 02/04/13 [phone]

## 2013-02-04 NOTE — BHH Counselor (Signed)
Telepsych initiated:  1. Called [facility] 2. Faxed pertinent information including the consultation form to [facility] 3. Awaiting a call back from the psychiatrist that will be completing the telepsych consult

## 2013-02-04 NOTE — B[facility] Counselor (Signed)
Telepsych recommended inpatient treatment. Admission request faxed to [facility].

## 2013-02-04 NOTE — ED Notes (Signed)
Pt was sleeping upon entering room.  Woke Pt and asked him to attempt to eat crackers.  Pt has tolerated Ginger Ale well.

## 2013-02-04 NOTE — ED Notes (Addendum)
Pt c/o n/v/d, abdominal pain and upper back pain x 4 days.  Pain score 8/10.  Pt sts back pain is relieved when he lays down.  Sts stopped taking Bipolar medication x 2 weeks ago, because "I've been drinking and it's not safe."  Sts last drink x 4 days ago.  Pt requesting detox for ETOH.  Vitals WDL.  A & Ox4.  Pt sts homelessness.

## 2013-02-05 ENCOUNTER — Encounter (HOSPITAL_COMMUNITY): Payer: Self-pay | Admitting: Intensive Care

## 2013-02-05 ENCOUNTER — Inpatient Hospital Stay (HOSPITAL_COMMUNITY)
Admission: AD | Admit: 2013-02-05 | Discharge: 2013-02-09 | DRG: 885 | Disposition: A | Discharge status: Home / Self Care | Payer: No Typology Code available for payment source | Source: Ambulatory Visit | Attending: Psychiatry | Admitting: Psychiatry

## 2013-02-05 DIAGNOSIS — F1010 Alcohol abuse, uncomplicated: Secondary | ICD-10-CM

## 2013-02-05 DIAGNOSIS — F39 Unspecified mood [affective] disorder: Secondary | ICD-10-CM

## 2013-02-05 DIAGNOSIS — F1020 Alcohol dependence, uncomplicated: Secondary | ICD-10-CM | POA: Diagnosis present

## 2013-02-05 DIAGNOSIS — Z79899 Other long term (current) drug therapy: Secondary | ICD-10-CM

## 2013-02-05 DIAGNOSIS — F1910 Other psychoactive substance abuse, uncomplicated: Secondary | ICD-10-CM

## 2013-02-05 DIAGNOSIS — F3160 Bipolar disorder, current episode mixed, unspecified: Principal | ICD-10-CM | POA: Diagnosis present

## 2013-02-05 DIAGNOSIS — F1410 Cocaine abuse, uncomplicated: Secondary | ICD-10-CM | POA: Diagnosis present

## 2013-02-05 LAB — URINE CULTURE
Colony Count: NO GROWTH
Culture: NO GROWTH

## 2013-02-05 MED ORDER — HYDROXYZINE HCL 25 MG PO TABS
25.0000 mg | ORAL_TABLET | Freq: Every evening | ORAL | Status: DC | PRN
  Administered 2013-02-05 – 2013-02-07 (×3): 25 mg via ORAL

## 2013-02-05 MED ORDER — MAGNESIUM HYDROXIDE 400 MG/5ML PO SUSP: 30.0000 mL | Freq: Every day | ORAL | Status: DC | PRN

## 2013-02-05 MED ORDER — ACETAMINOPHEN 325 MG PO TABS
650.0000 mg | ORAL_TABLET | Freq: Four times a day (QID) | ORAL | Status: DC | PRN
  Administered 2013-02-05 – 2013-02-06 (×2): 650 mg via ORAL

## 2013-02-05 MED ORDER — TRAZODONE HCL 50 MG PO TABS: 50.0000 mg | ORAL_TABLET | Freq: Every evening | ORAL | Status: DC | PRN

## 2013-02-05 MED ORDER — ALUM & MAG HYDROXIDE-SIMETH 200-200-20 MG/5ML PO SUSP: 30.0000 mL | ORAL | Status: DC | PRN

## 2013-02-05 MED ORDER — CHLORDIAZEPOXIDE HCL 25 MG PO CAPS: 25.0000 mg | ORAL_CAPSULE | Freq: Four times a day (QID) | ORAL | Status: AC | PRN

## 2013-02-05 NOTE — Progress Notes (Deleted)
Pt discharged per MD orders; pt currently denies SI/HI and auditory/visual hallucinations; pt was given education by RN regarding follow-up appointments and medications and pt denied any questions or concerns about these instructions; pt was then escorted to search room to retrieve his belongings by RN before being discharged to hospital lobby. 

## 2013-02-05 NOTE — ED Notes (Signed)
2 bags of belongings sent w/ pt

## 2013-02-05 NOTE — [facility] Counselor (Signed)
Patient accepted to [facility] by Governor Rooks to Dr. Patrick North. The room # is 508-1.EDP- Dr. Juleen China was notified of patients disposition. Patient's nurse- Wille Celeste was also notified. Call report # is [phone]. Support paperwork completed and faxed to [facility]. Patient is here voluntarily and will be transferred to [facility] via [employer].

## 2013-02-05 NOTE — ED Notes (Signed)
ACT into see, pt has been accepted to [facility] for admission

## 2013-02-05 NOTE — ED Notes (Addendum)
Security here to transport--pt to [facility] w/ security and NT

## 2013-02-05 NOTE — ED Notes (Signed)
Lupita Leash RN updated w/ VS and will review them w/ [facility]

## 2013-02-05 NOTE — Tx Team (Signed)
Initial Interdisciplinary Treatment Plan  PATIENT STRENGTHS: (choose at least two) Communication skills  PATIENT STRESSORS: Financial difficulties Health problems Marital or family conflict Medication change or noncompliance Occupational concerns Substance abuse   PROBLEM LIST: Problem List/Patient Goals Date to be addressed Date deferred Reason deferred Estimated date of resolution  ETOH Abuse 02/06/12                                                      DISCHARGE CRITERIA:  Ability to meet basic life and health needs Adequate post-discharge living arrangements Improved stabilization in mood, thinking, and/or behavior Medical problems require only outpatient monitoring  PRELIMINARY DISCHARGE PLAN: Attend aftercare/continuing care group Outpatient therapy  PATIENT/FAMIILY INVOLVEMENT: This treatment plan has been presented to and reviewed with the patient, Thomas [name].  The patient has been given the opportunity to ask questions and make suggestions.  Nestor Ramp MCCOLLUM 02/05/2013, 1:42 PM

## 2013-02-05 NOTE — Progress Notes (Signed)
Patient presents with an irritable mood and affect. Patient was lying on the floor of the business office stating that he didn't care about anything and that he wanted to rest. RN and MHT were able to rouse patient and bring him to the search room. Patient refused to give information about why he was here and stated that "I don't have to say anything" and that he "wanted a place to stay." Patient initially refused to have vital signs taken but RN was able to eventually get them. Patient states that he is homeless and that he was told our facility could "help with housing." When asked about drug or alcohol use the patient reports that he "drinks and drugs too much to remember." RN inquired further and was able to discern that patient uses THC, Cocaine and alcohol. CIWA was a 4 for headache and agitation. Orders released and reviewed with accepting nurse. Patient given education regarding unit policies and procedures. Patient currently denies SI/HI and auditory and visual hallucinations. Will continue to monitor patient for safety.

## 2013-02-05 NOTE — Progress Notes (Signed)
Collaborated with Verne Spurr regarding pt., she requested that pt be placed on hold till she can review his chart when she get her this morning.

## 2013-02-05 NOTE — ED Notes (Signed)
OK to transport pt to [facility] per donna RN

## 2013-02-05 NOTE — ED Notes (Signed)
Up to bathroom

## 2013-02-05 NOTE — ED Notes (Signed)
Pt reports stool x1 yesterday since arrival (runny),  Pt denies dysuria

## 2013-02-05 NOTE — ED Provider Notes (Signed)
Filed Vitals:   02/05/13 0618  BP: 106/68  Pulse: 87  Temp: 99.3 F (37.4 C)  Resp: 18   Pt in NAD. Pending evaluation by [facility] psychiatrist.   Raeford Razor, MD 02/05/13 [phone]

## 2013-02-06 DIAGNOSIS — F1994 Other psychoactive substance use, unspecified with psychoactive substance-induced mood disorder: Secondary | ICD-10-CM

## 2013-02-06 DIAGNOSIS — F3160 Bipolar disorder, current episode mixed, unspecified: Principal | ICD-10-CM

## 2013-02-06 MED ORDER — RISPERIDONE 0.5 MG PO TABS
0.5000 mg | ORAL_TABLET | Freq: Two times a day (BID) | ORAL | Status: DC
  Administered 2013-02-06 – 2013-02-07 (×2): 0.5 mg via ORAL
  Filled 2013-02-06 (×6): qty 1

## 2013-02-06 MED ORDER — RISPERIDONE 0.5 MG PO TABS
ORAL_TABLET | ORAL | Status: AC
  Filled 2013-02-06: qty 1

## 2013-02-06 MED ORDER — RISPERIDONE 0.5 MG PO TABS
0.5000 mg | ORAL_TABLET | Freq: Once | ORAL | Status: AC
  Administered 2013-02-06: 0.5 mg via ORAL
  Filled 2013-02-06: qty 1

## 2013-02-06 NOTE — [facility] Group Notes (Signed)
[facility] LCSW Aftercare Discharge Planning Group Note   02/06/2013 10:10 AM  Participation Quality:  Limited  Mood/Affect:  Depressed, Flat and Irritable  Depression Rating:    Anxiety Rating:    Thoughts of Suicide:  No  Will you contract for safety?   N/A  Current AVH:  No  Plan for Discharge/Comments:  Patient reports admitting to hospital for medication management and SA.  He reports abusing alcohol, cocaine ant THC.  Transportation Means:  Patient uses public transportation.  Supports:  Patient has a limited support syste.  Thomas [name], Thomas [name] July
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
## 2013-02-06 NOTE — B[facility] Suicide Risk Assessment (Signed)
Suicide Risk Assessment  Admission Assessment     Nursing information obtained from:    Demographic factors:   male, african [place] Current Mental Status:   Alert oriented to 4. Irritable, speech minimal, patient very evasive with poor eye contact. Disheveled in appearance with strong body odor. Loss Factors:   housing Historical Factors:   long history of substance abuse Risk Reduction Factors:   na  CLINICAL FACTORS:   Bipolar Disorder:   Mixed State Alcohol/Substance Abuse/Dependencies  COGNITIVE FEATURES THAT CONTRIBUTE TO RISK:  Closed-mindedness Thought constriction (tunnel vision)    SUICIDE RISK:   Mild:  Suicidal ideation of limited frequency, intensity, duration, and specificity.  There are no identifiable plans, no associated intent, mild dysphoria and related symptoms, good self-control (both objective and subjective assessment), few other risk factors, and identifiable protective factors, including available and accessible social support.  PLAN OF CARE: Start medications as needed. Provide supportive counselling and education.  I certify that inpatient services furnished can reasonably be expected to improve the patient's condition.  Thomas [name] 02/06/2013, 11:01 AM

## 2013-02-06 NOTE — Progress Notes (Signed)
D: Patient denies HI and auditory and visual hallucinations. Patient has an irritable mood and affect. The patient reports having passive SI but verbally agrees to contract for safety. The patient rates his depression a 9 out of 10 (1 low/10 high). The patient overheard by RN stating that he wants to "be here to get medications." In treatment team, patient appeared hostile and kept requesting different medications (including pain medications and depakote). The patient states that he "is aggressive and mean" and that he "has no problem hurting people" if they "piss him off." The patient also stated that he has "no problem spitting on people." The patient has attended two groups and states that he "doesn't have to go unless he 'wants' to."  A: Patient given emotional support from RN. Patient encouraged to come to staff with concerns and/or questions. Patient's medication routine continued. Patient's orders and plan of care reviewed. Patient started on medication by MD. Patient referred to MD for pain medication questions.   R: Patient remains irritable. Will continue to monitor patient q15 minutes for safety.
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
## 2013-02-06 NOTE — B[facility] Group Notes (Signed)
[facility] LCSW Group Therapy        Feelings Around Relapse        1:15-2:30 PM    02/06/2013 2:23 PM  Type of Therapy:  Group Therapy  Participation Level:  Active  Participation Quality:  Appropriate  Affect:  Appropriate  Cognitive:  Appropriate  Insight:  Developing/Improving and Engaged   Engagement in Therapy:  Developing/Improving and Lacking  Modes of Intervention:  Discussion, Education, Exploration, Problem-Solving, Rapport Building, Support  Summary of Progress/Problems:  Patient advised relapse would be returning to abusing alcohol and other drugs.  He reports he had two years sobriety several years ago and knows he has to make up his mind and get into treatment to stay sober.  [name] 02/06/2013, 2:23 PM

## 2013-02-06 NOTE — Progress Notes (Signed)
[facility] Group Notes:  (Nursing/MHT/Case Management/Adjunct)  Date:  02/06/2013  Time:  3:40 PM  Type of Therapy:  Therapeutic Activity  Participation Level:  Did Not Attend  Summary of Progress/Problems: Pt refused to come to this group.   Dalia Heading 02/06/2013, 3:40 PM

## 2013-02-06 NOTE — Tx Team (Signed)
Interdisciplinary Treatment Plan Update   Date Reviewed:  02/06/2013  Time Reviewed:  10:02 AM  Progress in Treatment:   Attending groups: No Participating in groups: No, but agrees to attend groups Taking medication as prescribed: Yes  Tolerating medication: Yes Family/Significant other contact made:   No, but patient consents for contact with sister Patient understands diagnosis: Yes  Discussing patient identified problems/goals with staff: Yes Medical problems stabilized or resolved: Yes Denies suicidal/homicidal ideation: Yes Patient has not harmed self or others: Yes  For review of initial/current patient goals, please see plan of care.  Estimated Length of Stay:    Reasons for Continued Hospitalization:  Anxiety Depression Medication stabilization  New Problems/Goals identified:    Discharge Plan or Barriers:   Home with outpatient follow up  Additional Comments:  Patient reports admitting to the hospital due to alcohol and drug abuse.  Patient was informed by MD that his alcohol level is normal.  He reports needing to be back on his meds that had been prescribed to him by a MD when he was a child.  MD to start patient on Resperidal .5 mg bid and Trazadone 50 mg  Attendees:  Patient:  Thomas [name] 02/06/2013 10:02 AM   Signature: Patrick North, MD 02/06/2013 10:02 AM  Signature:Tina Arlana Pouch, RN 02/06/2013 10:02 AM  Signature: Nestor Ramp, RN 02/06/2013 10:02 AM  Signature:Maseta Dorley, Care Coordiator 02/06/2013 10:02 AM  Signature:  Horton Marshall, RN 02/06/2013 10:02 AM  Signature:  Juline Patch, LCSW 02/06/2013 10:02 AM  Signature:  02/06/2013 10:02 AM  Signature: Liliane Bade, BSW 02/06/2013 10:02 AM  Signature: Fransisca Kaufmann, [facility] 02/06/2013 10:02 AM  Signature:    Signature:    Signature:      Scribe for Treatment Team:   Juline Patch,  02/06/2013 10:02 AM

## 2013-02-07 ENCOUNTER — Encounter (HOSPITAL_COMMUNITY): Payer: Self-pay | Admitting: Registered Nurse

## 2013-02-07 DIAGNOSIS — F1910 Other psychoactive substance abuse, uncomplicated: Secondary | ICD-10-CM

## 2013-02-07 DIAGNOSIS — F39 Unspecified mood [affective] disorder: Secondary | ICD-10-CM

## 2013-02-07 MED ORDER — RISPERIDONE 1 MG PO TABS
1.0000 mg | ORAL_TABLET | Freq: Every day | ORAL | Status: DC
  Administered 2013-02-07 – 2013-02-08 (×2): 1 mg via ORAL
  Filled 2013-02-07 (×5): qty 1

## 2013-02-07 MED ORDER — TRAZODONE HCL 50 MG PO TABS
50.0000 mg | ORAL_TABLET | Freq: Every evening | ORAL | Status: DC | PRN
  Administered 2013-02-07 – 2013-02-08 (×2): 50 mg via ORAL
  Filled 2013-02-07 (×2): qty 1
  Filled 2013-02-07: qty 14

## 2013-02-07 MED ORDER — RISPERIDONE 0.5 MG PO TABS
0.5000 mg | ORAL_TABLET | ORAL | Status: DC
  Administered 2013-02-08 – 2013-02-09 (×2): 0.5 mg via ORAL
  Filled 2013-02-07: qty 1
  Filled 2013-02-07: qty 21
  Filled 2013-02-07 (×2): qty 1

## 2013-02-07 MED ORDER — HYDROXYZINE HCL 25 MG PO TABS: 25.0000 mg | ORAL_TABLET | Freq: Every evening | ORAL | Status: DC | PRN

## 2013-02-07 MED ORDER — ONDANSETRON 4 MG PO TBDP
4.0000 mg | ORAL_TABLET | Freq: Three times a day (TID) | ORAL | Status: DC | PRN
  Administered 2013-02-07: 4 mg via ORAL

## 2013-02-07 NOTE — Progress Notes (Signed)
Goals  Group Note  Date:  02/07/2013 Time:  0915  Group Topic/Focus:  Goals Group:   The focus of this group is to help patients establish daily goals to achieve during treatment and discuss how the patient can incorporate goal setting into their daily lives to aide in recovery.  Participation Level: Did Not Attend  Participation Quality:  Not Applicable  Affect:  Not Applicable  Cognitive:  Not Applicable  Insight:  Not Applicable  Engagement in Group: Not Applicable  Additional Comments:    Rich Brave 02/07/2013, 1:00 PM

## 2013-02-07 NOTE — Progress Notes (Signed)
[facility] Group Notes:  (Nursing/MHT/Case Management/Adjunct)  Date:  02/06/2013 Time:  2000  Type of Therapy:  Psychoeducational Skills  Participation Level:  Minimal  Participation Quality:  Inattentive  Affect:  Flat  Cognitive:  Appropriate  Insight:  Lacking  Engagement in Group:  Lacking  Modes of Intervention:  Education  Summary of Progress/Problems: The patient said that his day went all right. He states that he felt calmer today ("less evil"). His goal for tomorrow is to get better.   Aarionna Germer S 02/07/2013, 2:09 AM

## 2013-02-07 NOTE — [facility] Group Notes (Signed)
[facility] LCSW Group Therapy  02/07/2013 3:00-4:00PM   Summary of Progress/Problems: The main focus of today's process group was for the patient to identify a self-sabotaging behavior related to their hospitalization that they would like to change, then to discuss their motivation to change. Many group members identified one thing they have in common is that they "stuff down" their emotions instead of dealing with them, whether it is sadness, guilt, or anger, and after a long period of time that becomes exhausting. The patient verbalized that he has a significant anger issue, and it becomes much worse when he is drinking or using drugs.  He has never had a sponsor, did not indicate an understanding of what this is.  He is asking for a counselor at discharge.  He once went to [facility] (former name of [facility]) for 90 days and then was sober 3 years.  He uses alcohol, cocaine, and marijuana.    Type of Therapy: Group Therapy   Participation Level: Active   Participation Quality:  Appropriate, Attentive and Sharing  Affect:  Blunted  Cognitive:  Appropriate and Oriented  Insight:  Developing/Improving  Engagement in Therapy: Engaged   Modes of Intervention: Discussion and Exploration  Sarina Ser 02/07/2013, 4:41 PM

## 2013-02-07 NOTE — Progress Notes (Signed)
Patient ID: KENTRAIL SHEW, male   DOB: Aug 28, 1971, 42 y.o.   MRN: [id number] D)  Seemed slightly brighter this evening, had showered, shaved, seemed appreciative that this writer had noticed he was looking better.  States not having the pain in his shoulder he had been having, feeling better, but voiced some concern about whether trazadone would help him sleep.  Reviewed relaxation techniques and encouraged him to work with it, which he agreed to do.  Attended group earlier, watched some tv, having a better evening. A)  Will continue to monitor for safety, continue POC R) Safety maintained.

## 2013-02-07 NOTE — Progress Notes (Addendum)
[facility] MD Progress Note  02/07/2013 10:48 AM Thomas [name]  MRN:  [id number] Subjective:  "I'm not doing to well.  I got a headache, I got to throw up; I'm not eating well.   Diagnosis:  Axis I: Mood Disorder NOS and Polysubstance Abuse  ADL's:  Intact  Sleep: Poor  Appetite:  Poor  Suicidal Ideation:  Yes Patient states that he was waiting on someone to "act up and I was going to act out".  Patient states that he was going to start something so that someone would do some thing to him. Homicidal Ideation:  Denies HI.  States that he was only going to hurt them.  Patient states that since admission he has been stay in his room and out of peoples way; "staying to my self so I won't hurt any one" AEB (as evidenced by):  Psychiatric Specialty Exam: Review of Systems  Psychiatric/Behavioral: Positive for depression, suicidal ideas and hallucinations. The patient is nervous/anxious and has insomnia.   All other systems reviewed and are negative.    Blood pressure 113/71, pulse 60, temperature 97.5 F (36.4 C), temperature source Oral, resp. rate 18, height 5\' 9"  (1.753 m), weight 68.04 kg (150 lb), SpO2 95.00%.Body mass index is 22.14 kg/(m^2).  General Appearance: Casual and Disheveled  Eye Contact::  Fair  Speech:  Clear and Coherent and Normal Rate  Volume:  Increased  Mood:  Angry, Anxious and Irritable  Affect:  Non-Congruent and Labile  Thought Process:  Circumstantial and Loose  Orientation:  Full (Time, Place, and Person)  Thought Content:  Rumination  Suicidal Thoughts:  Yes.  without intent/plan  Homicidal Thoughts:  No  Thoughts of harming other  Memory:  Immediate;   Good Recent;   Good Remote;   Good  Judgement:  Fair  Insight:  Fair  Psychomotor Activity:  Normal  Concentration:  Fair  Recall:  Fair  Akathisia:  No  Handed:  Right  AIMS (if indicated):     Assets:  Communication Skills  Sleep:  Number of Hours: 5   Current Medications: Current  Facility-Administered Medications  Medication Dose Route Frequency Provider Last Rate Last Dose  . acetaminophen (TYLENOL) tablet 650 mg  650 mg Oral Q6H PRN Verne Spurr, PA-C   650 mg at 02/06/13 2206  . alum & mag hydroxide-simeth (MAALOX/MYLANTA) 200-200-20 MG/5ML suspension 30 mL  30 mL Oral Q4H PRN Verne Spurr, PA-C      . chlordiazePOXIDE (LIBRIUM) capsule 25 mg  25 mg Oral Q6H PRN Fransisca Kaufmann, NP      . hydrOXYzine (ATARAX/VISTARIL) tablet 25 mg  25 mg Oral QHS PRN,MR X 1 Fransisca Kaufmann, NP   25 mg at 02/07/13 0031  . magnesium hydroxide (MILK OF MAGNESIA) suspension 30 mL  30 mL Oral Daily PRN Verne Spurr, PA-C      . risperiDONE (RISPERDAL) tablet 0.5 mg  0.5 mg Oral BID Himabindu Ravi, MD   0.5 mg at 02/06/13 1646    Lab Results: No results found for this or any previous visit (from the past 48 hour(s)).  Physical Findings: AIMS: Facial and Oral Movements Muscles of Facial Expression: None, normal Lips and Perioral Area: None, normal Jaw: None, normal Tongue: None, normal,Extremity Movements Upper (arms, wrists, hands, fingers): None, normal Lower (legs, knees, ankles, toes): None, normal, Trunk Movements Neck, shoulders, hips: None, normal, Overall Severity Severity of abnormal movements (highest score from questions above): None, normal Incapacitation due to abnormal movements: None, normal Patient's awareness of  abnormal movements (rate only patient's report): No Awareness, Dental Status Current problems with teeth and/or dentures?: No Does patient usually wear dentures?: No  CIWA:  CIWA-Ar Total: 4 COWS:     Treatment Plan Summary: Daily contact with patient to assess and evaluate symptoms and progress in treatment Medication management  Plan: Trazodone 50 mg qhs prn may take Vistaril 25 mg prn qh if Trazodone is not affective.  Will continue all other current treatments.    Medical Decision Making Problem Points:  Established problem, stable/improving (1), Review  of last therapy session (1) and Review of psycho-social stressors (1) Data Points:  Review or order clinical lab tests (1) Review and summation of old records (2) Review of medication regiment & side effects (2)  I certify that inpatient services furnished can reasonably be expected to improve the patient's condition.   Rankin, Shuvon 02/07/2013, 10:48 AM  Discussed with provider. Reviewed note. Agree with above findings, assessment and plan with the following changes: Will increase Risperidone 0.5 mg and 1 mg QHS for agitation.  Jacqulyn Cane, M.D.  02/07/2013 7:44 PM
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
## 2013-02-08 NOTE — Progress Notes (Signed)
Psychoeducational Group Note  Date:  02/08/2013 Time:  1015  Group Topic/Focus:  Making Healthy Choices:   The focus of this group is to help patients identify negative/unhealthy choices they were using prior to admission and identify positive/healthier coping strategies to replace them upon discharge.  Participation Level: Did Not Attend  Participation Quality:  Not Applicable  Affect:  Not Applicable  Cognitive:  Not Applicable  Insight:  Not Applicable  Engagement in Group: Not Applicable  Additional Comments:    Rich Brave 02/08/2013, 2:01 PM

## 2013-02-08 NOTE — Progress Notes (Addendum)
D:  Patient's self inventory sheet, patient has poor sleep, good appetite, normal energy level, good attention span.  Rated depression #6.  Denied hopelessness and anxiety.  Denied withdrawals.   Denied SI.  Has left shoulder pain at times.  Zero pain goal.  Not sure how to better himself after discharge.  No discharge plans.  Needs financial assistance to purchase meds after discharge. A:  Medications administered per MD order.  Support and encouragement given throughout day. R:  Patient would not get out of bed to go to groups this morning.  Denied SI and HI.  Denied A/V hallucinations.  Contracts for safety. Will continue 15 minute checks for safety.   Safety maintained. Patient stated his sleep medication makes him feel drowsy in the morning and that is the reason he has stayed in bed.  Went to dining room for lunch.  Did not go to sleep until 3:00 am this morning.  Will discuss meds with MD.

## 2013-02-08 NOTE — Progress Notes (Signed)
Patient ID: Thomas [name], male   DOB: March 15, 1971, 42 y.o.   MRN: [id number] D)  Has been out and about on the hall this evening, voicing concern about whether he'll be able to sleep tonight, and that he is sleepy in groups.  When I pulled up the notes on groups, however, it showed he was not attending.  He admitted to sleeping off and on most of the day, and that has been his routine at home, he said.  He had been sleeping all day at home because he was usually up all night doing things.  We discussed getting back to a normal day/night pattern, and also how important groups are , that by only attending in the evening for the wrap up group, he hadn't been setting goals for himself, etc.  His goal is going to be attending group tomorrow, but said his real goal is to go home. Took his hs meds and went to bed. A)  Will continue to monitor for safety, continue POC R)  Safety maintained.

## 2013-02-08 NOTE — Progress Notes (Signed)
Goals Group Note  Date:  02/08/2013 Time:  0915 Group Topic/Focus:  Making Healthy Choices:   The focus of this group is to help patients identify negative/unhealthy choices they were using prior to admission and identify positive/healthier coping strategies to replace them upon discharge.  Participation Level:  Did Not Attend   Additional Comments:    Rich Brave 1:52 PM. 02/08/2013

## 2013-02-08 NOTE — [facility] Group Notes (Signed)
[facility] Group Notes: (Clinical Social Work)   02/08/2013      Type of Therapy:  Group Therapy   Participation Level:  Did Not Attend    Ambrose Mantle, LCSW 02/08/2013, 5:05 PM
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
## 2013-02-08 NOTE — Progress Notes (Signed)
[facility] Group Notes:  (Nursing/MHT/Case Management/Adjunct)  Date:  02/07/2013 Time:  2000  Type of Therapy:  Psychoeducational Skills  Participation Level:  Minimal  Participation Quality:  Inattentive  Affect:  Depressed  Cognitive:  Appropriate  Insight:  Lacking  Engagement in Group:  Limited  Modes of Intervention:  Education  Summary of Progress/Problems: The patient had very little to share with the group except to say that he went outside. He also said that he had a non-eventful day. His goal for tomorrow is to prepare himself for discharge.   Ameliana Brashear S 02/08/2013, 12:22 AM

## 2013-02-09 MED ORDER — TRAZODONE HCL 50 MG PO TABS: 50.0000 mg | ORAL_TABLET | Freq: Every evening | ORAL | Status: DC | PRN

## 2013-02-09 MED ORDER — RISPERIDONE 1 MG PO TABS: 1.0000 mg | ORAL_TABLET | Freq: Every day | ORAL | Status: DC

## 2013-02-09 MED ORDER — RISPERIDONE 0.5 MG PO TABS: 0.5000 mg | ORAL_TABLET | ORAL | Status: DC
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
## 2013-02-09 NOTE — Progress Notes (Signed)
[facility] Group Notes:  (Nursing/MHT/Case Management/Adjunct)  Date:  02/08/2013 Time:  2000  Type of Therapy:  Psychoeducational Skills  Participation Level:  Active  Participation Quality:  Attentive  Affect:  Flat  Cognitive:  Appropriate  Insight:  Lacking  Engagement in Group:  Developing/Improving  Modes of Intervention:  Education  Summary of Progress/Problems: The patient shared with the group that his day went about as well as yesterday. He stated that he was more active , but at the same time, he felt that his medication is not working. His goal for tomorrow is to get discharged.   Hazle Coca S 02/09/2013, 12:39 AM

## 2013-02-09 NOTE — [facility] Group Notes (Signed)
[facility] LCSW Aftercare Discharge Planning Group Note   02/09/2013 10:35 AM  Participation Quality:  Appropriate  Mood/Affect:  Appropriate  Depression Rating:  Zero   Anxiety Rating:  Zero  Thoughts of Suicide:  No Will you contract for safety?   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  Transportation Means: Patient to be assisted with a bus pass  Supports:  Patient has limited support system.  Thomas [name], Thomas [name] July

## 2013-02-09 NOTE — Progress Notes (Signed)
D?  Patient's self inventory sheet, patient sleeps well, has good appetite, normal energy level, good attention span.  Rated depression and hopelessness #1.  Denied SI.  Denied physical problems.  After discharge, plans to go to meetings and stay on meds.  No questions for staff.  No problems taking meds after discharge. A:  Medications administered per MD orders.  Support and encouragement given throughout day. R:  Following treatment plan.  Denied SI and HI.  Denied A/V hallucinations.  Denied pain.  Contracts for safety. Will continue to monitor for safety with 15 minute checks.  Safe maintained.  Patient excited about today's discharge.

## 2013-02-09 NOTE — Tx Team (Signed)
Interdisciplinary Treatment Plan Update   Date Reviewed:  02/09/2013  Time Reviewed:  9:47 AM  Progress in Treatment:   Attending groups: No Participating in groups: No, but agrees to attend groups Taking medication as prescribed: Yes  Tolerating medication: Yes Family/Significant other contact made:   No, but patient consents for contact with sister Patient understands diagnosis: Yes  Discussing patient identified problems/goals with staff: Yes Medical problems stabilized or resolved: Yes Denies suicidal/homicidal ideation: Yes Patient has not harmed self or others: Yes  For review of initial/current patient goals, please see plan of care.  Estimated Length of Stay:  Discharge home today.  Reasons for Continued Hospitalization:   New Problems/Goals identified:    Discharge Plan or Barriers:   Home with outpatient follow up  Additional Comments:  Patient reports being stable on medications are ready to discharge home today.  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  He is rating all symptoms at zero. Attendees:  Patient:  Geordan Xu 02/09/2013 9:47 AM   Signature: Patrick North, MD 02/09/2013 9:47 AM  Signature:  Neill Loft, RN 02/09/2013 9:47 AM  Signature: Quintella Reichert, RN 02/09/2013 9:47 AM  Signature:Maseta Dorley, Care Coordiator 02/09/2013 9:47 AM  Signature:  Ila Mcgill, MHT  02/09/2013 9:47 AM  Signature:  Juline Patch, LCSW 02/09/2013 9:47 AM  Signature:  02/09/2013 9:47 AM  Signature:  02/09/2013 9:47 AM  Signature: Fransisca Kaufmann, [facility] 02/09/2013 9:47 AM  Signature:    Signature:    Signature:      Scribe for Treatment Team:   Juline Patch,  02/09/2013 9:47 AM

## 2013-02-09 NOTE — Progress Notes (Signed)
Discharge Note:  Patient given bus pass, [place] and [place] dad/s[place].  Patient received all his meds, prescriptions, coat, phones, wallet, cards, belt, clothing, miscellaneous items, toiletries.  Denied SI and HI.  Denied A/V hallucinations.  Denied pain.  Patient received suicide prevention information which was discussed with patient, which he stated he understood.  Patient has been cooperative and pleasant.

## 2013-02-09 NOTE — Plan of Care (Signed)
Problem: Ineffective individual coping Goal: STG: Patient will participate in after care plan Outcome: Completed/Met Date Met:  02/09/13 Patient attended groups and discussed concerns.  He will follow up with [facility] for outpatient services.

## 2013-02-09 NOTE — [facility] Suicide Risk Assessment (Signed)
[facility] INPATIENT:  Family/Significant Other Suicide Prevention Education  Suicide Prevention Education:  Education Completed; Thomas [name], Sister, [phone] has been identified by the patient as the family member/significant other with whom the patient will be residing, and identified as the person(s) who will aid the patient in the event of a mental health crisis (suicidal ideations/suicide attempt).  With written consent from the patient, the family member/significant other has been provided the following suicide prevention education, prior to the and/or following the discharge of the patient.  The suicide prevention education provided includes the following:  Suicide risk factors  Suicide prevention and interventions  National Suicide Hotline telephone number  [facility] assessment telephone number  [facility] Emergency Assistance 911  [facility] and/or Residential Mobile Crisis Unit telephone number  Request made of family/significant other to:  Remove weapons (e.g., guns, rifles, knives), all items previously/currently identified as safety concern.  Sister is not aware of patient owning or having access to a gun.  Remove drugs/medications (over-the-counter, prescriptions, illicit drugs), all items previously/currently identified as a safety concern.  The family member/significant other verbalizes understanding of the suicide prevention education information provided.  The family member/significant other agrees to remove the items of safety concern listed above.  Thomas [name] 02/09/2013, 1:02 PM

## 2013-02-09 NOTE — Progress Notes (Signed)
[facility] Adult Case Management Discharge Plan :  Will you be returning to the same living situation after discharge: Yes,  Patient will return to previous living arrangement. At discharge, do you have transportation home?: Patient will need a bus pass. Do you have the ability to pay for your medications:  Patient to be assisted with indigent medications.  Release of information consent forms completed and in the chart;  Patient's signature needed at discharge.  Patient to Follow up at: Follow-up Information   Follow up with [facility] On 02/10/2013. (Please go to [facility] for medication management on Tuesday, February 10, 2013 or any weekday between 8AM-3PM for medication management)    Contact information:   [address] [place], [place]   [address]      Patient denies SI/HI: Patient no longer endorsing SI/HI or other thoughts of self harm.  Safety Planning and Suicide Prevention discussed: .Reviewed with all patients during discharge planning group   Lamiyah Schlotter, [place] July 02/09/2013, 10:24 AM

## 2013-02-09 NOTE — Plan of Care (Signed)
Problem: Alteration in mood & ability to function due to Goal: LTG-Patient demonstrates decreased signs of withdrawal (Patient demonstrates decreased signs of withdrawal to the point the patient is safe to return home and continue treatment in an outpatient setting)  Outcome: Completed/Met Date Met:  02/09/13 Patient is not endorsing any withdrawal symptoms.  He has declined residential follow up at [facility].

## 2013-02-12 NOTE — Progress Notes (Signed)
Patient Discharge Instructions:  No documentation sent.  The patient refused aftercare per the avs.  Jerelene Redden, 02/12/2013, 3:50 PM

## 2013-02-18 ENCOUNTER — Emergency Department (HOSPITAL_COMMUNITY): Payer: Self-pay

## 2013-02-18 ENCOUNTER — Encounter (HOSPITAL_COMMUNITY): Payer: Self-pay

## 2013-02-18 ENCOUNTER — Emergency Department (HOSPITAL_COMMUNITY)
Admission: EM | Admit: 2013-02-18 | Discharge: 2013-02-19 | Disposition: A | Discharge status: Other Acute Inpatient Hospital | Payer: Self-pay | Attending: Emergency Medicine | Admitting: Emergency Medicine

## 2013-02-18 DIAGNOSIS — F319 Bipolar disorder, unspecified: Secondary | ICD-10-CM | POA: Insufficient documentation

## 2013-02-18 DIAGNOSIS — S8010XA Contusion of unspecified lower leg, initial encounter: Secondary | ICD-10-CM | POA: Insufficient documentation

## 2013-02-18 DIAGNOSIS — Z79899 Other long term (current) drug therapy: Secondary | ICD-10-CM | POA: Insufficient documentation

## 2013-02-18 DIAGNOSIS — F17200 Nicotine dependence, unspecified, uncomplicated: Secondary | ICD-10-CM | POA: Insufficient documentation

## 2013-02-18 DIAGNOSIS — F1010 Alcohol abuse, uncomplicated: Secondary | ICD-10-CM | POA: Insufficient documentation

## 2013-02-18 DIAGNOSIS — M545 Low back pain, unspecified: Secondary | ICD-10-CM | POA: Insufficient documentation

## 2013-02-18 DIAGNOSIS — F1410 Cocaine abuse, uncomplicated: Secondary | ICD-10-CM | POA: Insufficient documentation

## 2013-02-18 DIAGNOSIS — F1210 Cannabis abuse, uncomplicated: Secondary | ICD-10-CM | POA: Insufficient documentation

## 2013-02-18 DIAGNOSIS — S8012XA Contusion of left lower leg, initial encounter: Secondary | ICD-10-CM

## 2013-02-18 DIAGNOSIS — G8929 Other chronic pain: Secondary | ICD-10-CM | POA: Insufficient documentation

## 2013-02-18 LAB — COMPREHENSIVE METABOLIC PANEL
ALT: 140 U/L — ABNORMAL HIGH (ref 0–53)
AST: 37 U/L (ref 0–37)
CO2: 21 mEq/L (ref 19–32)
Chloride: 98 mEq/L (ref 96–112)
Creatinine, Ser: 0.95 mg/dL (ref 0.50–1.35)
GFR calc Af Amer: >90 mL/min (ref >90)
GFR calc non Af Amer: >90 mL/min (ref >90)
Glucose, Bld: 118 mg/dL — ABNORMAL HIGH (ref 70–99)
Sodium: 133 mEq/L — ABNORMAL LOW (ref 135–145)
Total Bilirubin: 0.4 mg/dL (ref 0.3–1.2)

## 2013-02-18 LAB — CBC
Hemoglobin: 13.6 g/dL (ref 13.0–17.0)
MCH: 30.4 pg (ref 26.0–34.0)
MCV: 87.7 fL (ref 78.0–100.0)
Platelets: 319 10*3/uL (ref 150–400)
RBC: 4.48 MIL/uL (ref 4.22–5.81)
WBC: 6.8 10*3/uL (ref 4.0–10.5)

## 2013-02-18 LAB — URINE MICROSCOPIC-ADD ON

## 2013-02-18 LAB — URINALYSIS, ROUTINE W REFLEX MICROSCOPIC
Bilirubin Urine: NEGATIVE
Hgb urine dipstick: NEGATIVE
Nitrite: NEGATIVE
Protein, ur: 30 mg/dL — AB
Urobilinogen, UA: 1 mg/dL (ref 0.0–1.0)

## 2013-02-18 LAB — RAPID URINE DRUG SCREEN, HOSP PERFORMED
Barbiturates: NOT DETECTED
Tetrahydrocannabinol: POSITIVE — AB

## 2013-02-18 MED ORDER — ALUM & MAG HYDROXIDE-SIMETH 200-200-20 MG/5ML PO SUSP: 30.0000 mL | ORAL | Status: DC | PRN

## 2013-02-18 MED ORDER — IBUPROFEN 600 MG PO TABS
600.0000 mg | ORAL_TABLET | Freq: Three times a day (TID) | ORAL | Status: DC | PRN
  Administered 2013-02-18 – 2013-02-19 (×2): 600 mg via ORAL
  Filled 2013-02-18 (×3): qty 1

## 2013-02-18 MED ORDER — FOLIC ACID 1 MG PO TABS
1.0000 mg | ORAL_TABLET | Freq: Every day | ORAL | Status: DC
  Administered 2013-02-18 – 2013-02-19 (×2): 1 mg via ORAL
  Filled 2013-02-18 (×2): qty 1

## 2013-02-18 MED ORDER — RISPERIDONE 0.5 MG PO TABS: 0.5000 mg | ORAL_TABLET | ORAL | Status: DC

## 2013-02-18 MED ORDER — ONDANSETRON HCL 4 MG PO TABS
4.0000 mg | ORAL_TABLET | Freq: Three times a day (TID) | ORAL | Status: DC | PRN
  Administered 2013-02-19: 4 mg via ORAL
  Filled 2013-02-18: qty 1

## 2013-02-18 MED ORDER — RISPERIDONE 1 MG PO TABS: 1.0000 mg | ORAL_TABLET | Freq: Every day | ORAL | Status: DC

## 2013-02-18 MED ORDER — VITAMIN B-1 100 MG PO TABS
100.0000 mg | ORAL_TABLET | Freq: Every day | ORAL | Status: DC
  Administered 2013-02-18 – 2013-02-19 (×2): 100 mg via ORAL
  Filled 2013-02-18 (×2): qty 1

## 2013-02-18 MED ORDER — RISPERIDONE 2 MG PO TABS
2.0000 mg | ORAL_TABLET | Freq: Every day | ORAL | Status: DC
  Administered 2013-02-18: 2 mg via ORAL
  Filled 2013-02-18: qty 1

## 2013-02-18 MED ORDER — LORAZEPAM 1 MG PO TABS
2.0000 mg | ORAL_TABLET | Freq: Once | ORAL | Status: AC
  Administered 2013-02-18: 2 mg via ORAL
  Filled 2013-02-18: qty 2

## 2013-02-18 MED ORDER — ZIPRASIDONE MESYLATE 20 MG IM SOLR
20.0000 mg | Freq: Once | INTRAMUSCULAR | Status: AC
  Administered 2013-02-18: 20 mg via INTRAMUSCULAR
  Filled 2013-02-18: qty 20

## 2013-02-18 MED ORDER — SULFAMETHOXAZOLE-TMP DS 800-160 MG PO TABS
1.0000 | ORAL_TABLET | Freq: Two times a day (BID) | ORAL | Status: DC
  Administered 2013-02-18 – 2013-02-19 (×2): 1 via ORAL
  Filled 2013-02-18 (×2): qty 1

## 2013-02-18 MED ORDER — LORAZEPAM 2 MG/ML IJ SOLN: 1.0000 mg | Freq: Four times a day (QID) | INTRAMUSCULAR | Status: DC | PRN

## 2013-02-18 MED ORDER — THIAMINE HCL 100 MG/ML IJ SOLN: 100.0000 mg | Freq: Every day | INTRAMUSCULAR | Status: DC

## 2013-02-18 MED ORDER — NICOTINE 21 MG/24HR TD PT24
21.0000 mg | MEDICATED_PATCH | Freq: Every day | TRANSDERMAL | Status: DC
  Administered 2013-02-19: 21 mg via TRANSDERMAL
  Filled 2013-02-18 (×2): qty 1

## 2013-02-18 MED ORDER — TRAZODONE HCL 50 MG PO TABS: 50.0000 mg | ORAL_TABLET | Freq: Every evening | ORAL | Status: DC | PRN

## 2013-02-18 MED ORDER — LORAZEPAM 1 MG PO TABS: 0.0000 mg | ORAL_TABLET | Freq: Two times a day (BID) | ORAL | Status: DC

## 2013-02-18 MED ORDER — RISPERIDONE 0.5 MG PO TABS
0.5000 mg | ORAL_TABLET | Freq: Once | ORAL | Status: AC
  Administered 2013-02-18: 0.5 mg via ORAL
  Filled 2013-02-18: qty 1

## 2013-02-18 MED ORDER — LORAZEPAM 1 MG PO TABS
1.0000 mg | ORAL_TABLET | Freq: Four times a day (QID) | ORAL | Status: DC | PRN
  Administered 2013-02-19: 1 mg via ORAL
  Filled 2013-02-18: qty 1

## 2013-02-18 MED ORDER — OXYCODONE-ACETAMINOPHEN 5-325 MG PO TABS: 2.0000 | ORAL_TABLET | Freq: Four times a day (QID) | ORAL | Status: DC | PRN

## 2013-02-18 MED ORDER — LORAZEPAM 1 MG PO TABS
0.0000 mg | ORAL_TABLET | Freq: Four times a day (QID) | ORAL | Status: DC
  Administered 2013-02-18 – 2013-02-19 (×2): 2 mg via ORAL
  Filled 2013-02-18 (×2): qty 2

## 2013-02-18 MED ORDER — ACETAMINOPHEN 325 MG PO TABS
650.0000 mg | ORAL_TABLET | ORAL | Status: DC | PRN
  Administered 2013-02-18 – 2013-02-19 (×4): 650 mg via ORAL
  Filled 2013-02-18: qty 1
  Filled 2013-02-18 (×3): qty 2

## 2013-02-18 MED ORDER — ADULT MULTIVITAMIN W/MINERALS CH
1.0000 | ORAL_TABLET | Freq: Every day | ORAL | Status: DC
  Administered 2013-02-18 – 2013-02-19 (×2): 1 via ORAL
  Filled 2013-02-18 (×2): qty 1

## 2013-02-18 MED ORDER — OXYCODONE-ACETAMINOPHEN 5-325 MG PO TABS
2.0000 | ORAL_TABLET | Freq: Once | ORAL | Status: AC
  Administered 2013-02-18: 2 via ORAL
  Filled 2013-02-18: qty 2

## 2013-02-18 NOTE — ED Notes (Signed)
When assessing pt concerning leg, pt did not express any ideas/complaints for detox or any other psych changes.

## 2013-02-18 NOTE — ED Notes (Signed)
Tylenol given for low grade temp-encouraged to drink a lot of fluids today.
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
## 2013-02-18 NOTE — ED Notes (Signed)
2 bags in locker 41 

## 2013-02-18 NOTE — ED Provider Notes (Signed)
Patient with telepsych eval this morning. Recommends inpatient treatment. Will monitor for ETOH withdrawal and will give risperdal as per recommendations.   Thomas [name]. Rubin Payor, MD 02/18/13 [phone]
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
## 2013-02-18 NOTE — ED Notes (Signed)
Pt requesting detox at the time of discharge

## 2013-02-18 NOTE — ED Notes (Signed)
Dr. Anitra Lauth informed of patient's fever and drenching sweats and vitals signs:BP 98/63,  P 72,  R 20, 02 sats 98% on RA. DR. Anitra Lauth stated that she would place new order. Patient respirations equal and unlabored and skin moist.

## 2013-02-18 NOTE — ED Notes (Signed)
Patient came to nurses station asking to speak to me. Went to his room-told me "I woke up evil-feeling like I might go off on someone." Notified MD-orders received and initiated.

## 2013-02-18 NOTE — ED Notes (Signed)
Patient complains of increase agitation. Patient also has increase sweating. Patient will be medicated per MAR.

## 2013-02-19 ENCOUNTER — Inpatient Hospital Stay (HOSPITAL_COMMUNITY)
Admission: EM | Admit: 2013-02-19 | Discharge: 2013-02-26 | DRG: 897 | Disposition: A | Discharge status: Home / Self Care | Payer: Federal, State, Local not specified - Other | Source: Intra-hospital | Attending: Psychiatry | Admitting: Psychiatry

## 2013-02-19 ENCOUNTER — Encounter (HOSPITAL_COMMUNITY): Payer: Self-pay | Admitting: *Deleted

## 2013-02-19 DIAGNOSIS — Z79899 Other long term (current) drug therapy: Secondary | ICD-10-CM

## 2013-02-19 DIAGNOSIS — F1010 Alcohol abuse, uncomplicated: Principal | ICD-10-CM | POA: Diagnosis present

## 2013-02-19 DIAGNOSIS — F1410 Cocaine abuse, uncomplicated: Secondary | ICD-10-CM | POA: Diagnosis present

## 2013-02-19 DIAGNOSIS — F39 Unspecified mood [affective] disorder: Secondary | ICD-10-CM

## 2013-02-19 DIAGNOSIS — F319 Bipolar disorder, unspecified: Secondary | ICD-10-CM | POA: Diagnosis present

## 2013-02-19 HISTORY — DX: Depression, unspecified: F32.A

## 2013-02-19 HISTORY — DX: Major depressive disorder, single episode, unspecified: F32.9

## 2013-02-19 MED ORDER — CHLORDIAZEPOXIDE HCL 25 MG PO CAPS
25.0000 mg | ORAL_CAPSULE | ORAL | Status: AC
  Administered 2013-02-22 (×2): 25 mg via ORAL
  Filled 2013-02-19 (×2): qty 1

## 2013-02-19 MED ORDER — CHLORDIAZEPOXIDE HCL 25 MG PO CAPS
25.0000 mg | ORAL_CAPSULE | Freq: Every day | ORAL | Status: AC
  Filled 2013-02-19: qty 1

## 2013-02-19 MED ORDER — VITAMIN B-1 100 MG PO TABS
100.0000 mg | ORAL_TABLET | Freq: Every day | ORAL | Status: DC
  Administered 2013-02-20 – 2013-02-26 (×6): 100 mg via ORAL
  Filled 2013-02-19 (×9): qty 1

## 2013-02-19 MED ORDER — ONDANSETRON 4 MG PO TBDP
4.0000 mg | ORAL_TABLET | Freq: Four times a day (QID) | ORAL | Status: AC | PRN
  Administered 2013-02-21 – 2013-02-22 (×2): 4 mg via ORAL

## 2013-02-19 MED ORDER — LOPERAMIDE HCL 2 MG PO CAPS: 2.0000 mg | ORAL_CAPSULE | ORAL | Status: AC | PRN

## 2013-02-19 MED ORDER — CHLORDIAZEPOXIDE HCL 25 MG PO CAPS
25.0000 mg | ORAL_CAPSULE | Freq: Four times a day (QID) | ORAL | Status: AC | PRN
  Administered 2013-02-21: 25 mg via ORAL
  Filled 2013-02-19: qty 1

## 2013-02-19 MED ORDER — ACETAMINOPHEN 325 MG PO TABS
650.0000 mg | ORAL_TABLET | Freq: Four times a day (QID) | ORAL | Status: DC | PRN
  Administered 2013-02-19 – 2013-02-20 (×2): 650 mg via ORAL

## 2013-02-19 MED ORDER — ADULT MULTIVITAMIN W/MINERALS CH
1.0000 | ORAL_TABLET | Freq: Every day | ORAL | Status: DC
  Administered 2013-02-19 – 2013-02-26 (×7): 1 via ORAL
  Filled 2013-02-19 (×10): qty 1

## 2013-02-19 MED ORDER — THIAMINE HCL 100 MG/ML IJ SOLN
100.0000 mg | Freq: Once | INTRAMUSCULAR | Status: AC
  Administered 2013-02-19: 18:00:00 via INTRAMUSCULAR

## 2013-02-19 MED ORDER — ALUM & MAG HYDROXIDE-SIMETH 200-200-20 MG/5ML PO SUSP: 30.0000 mL | ORAL | Status: DC | PRN

## 2013-02-19 MED ORDER — MAGNESIUM HYDROXIDE 400 MG/5ML PO SUSP: 30.0000 mL | Freq: Every day | ORAL | Status: DC | PRN

## 2013-02-19 MED ORDER — CHLORDIAZEPOXIDE HCL 25 MG PO CAPS
25.0000 mg | ORAL_CAPSULE | Freq: Four times a day (QID) | ORAL | Status: AC
  Administered 2013-02-19 – 2013-02-20 (×6): 25 mg via ORAL
  Filled 2013-02-19 (×7): qty 1

## 2013-02-19 MED ORDER — TRAZODONE HCL 50 MG PO TABS
50.0000 mg | ORAL_TABLET | Freq: Every day | ORAL | Status: DC
  Administered 2013-02-19 – 2013-02-25 (×6): 50 mg via ORAL
  Filled 2013-02-19: qty 14
  Filled 2013-02-19 (×9): qty 1

## 2013-02-19 MED ORDER — HYDROXYZINE HCL 25 MG PO TABS
25.0000 mg | ORAL_TABLET | Freq: Four times a day (QID) | ORAL | Status: AC | PRN
  Administered 2013-02-19: 25 mg via ORAL

## 2013-02-19 MED ORDER — CHLORDIAZEPOXIDE HCL 25 MG PO CAPS
25.0000 mg | ORAL_CAPSULE | Freq: Three times a day (TID) | ORAL | Status: AC
  Administered 2013-02-21 (×3): 25 mg via ORAL
  Filled 2013-02-19 (×3): qty 1

## 2013-02-19 NOTE — ED Notes (Signed)
eatting lunch 

## 2013-02-19 NOTE — Progress Notes (Signed)
Pt did not attend Karaoke group.  

## 2013-02-19 NOTE — ED Notes (Addendum)
Ortho tech into fit crutches

## 2013-02-19 NOTE — ED Notes (Signed)
Patient continues have drenching sweats, and shakes. Patient has a CIWA 18 will monitor and recheck CIWA in 1 hour. Respirations equal and unlabored, Skin moist.
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
## 2013-02-19 NOTE — ED Notes (Signed)
Ortho tech to fit pt for crutches prior to transfer

## 2013-02-19 NOTE — BHH Counselor (Signed)
Patient accepted to [facility] by Donell Sievert to Dr. Dub Mikes. Room assignment is 306-1. Support paperwork completed and faxed to [facility]. Nursing staff made aware of disposition. Nursing call report # is [phone]. EDP made aware. Patient voluntary and will be transported to [facility] via security.

## 2013-02-19 NOTE — ED Notes (Signed)
Pt sleeping soundly, unable to answer questions or wake enough to take pills

## 2013-02-19 NOTE — ED Notes (Signed)
Up to the bathroom 

## 2013-02-19 NOTE — ED Notes (Signed)
Pt ambulatory w/ crutches to [facility] w/ security and NT

## 2013-02-19 NOTE — ED Notes (Signed)
Dr. Rulon Abide notified of patient's vital signs , CIWA's scores and that patient continues to moderately sweat and have increase temp of 100.0 after being given650 mg PO of  tylenol for low grade fever 99.0. Dr. Rulon Abide gave orders to give patient Ibuprofen from patient prn orders.

## 2013-02-19 NOTE — ED Provider Notes (Signed)
Assuming care of patient this morning. Patient in the ED for alcohol abuse and detox request and SI.  Awaiting placement. Workup thus far is otherwise negative. Patient had no complains, no concerns from the nursing side. Will continue to monitor.   Filed Vitals:   02/19/13 0540  BP: 118/74  Pulse: 91  Temp:   Resp:      Derwood Kaplan, MD 02/19/13 [phone]

## 2013-02-19 NOTE — Progress Notes (Addendum)
Patient ID: Thomas [name], male   DOB: [birth year], 42 y.o.   MRN: [id number]  D: Pt denies SI/HI/AVH. Pt is pleasant and cooperative . Pt complained of HA and given tylenol 1951. Pt states " I haven't been able to sleep good". Pt given repeat 50 mg trazodone (one time order, per PA on call).   A: Pt was offered support and encouragement. Pt was given scheduled medications. Pt was encourage to attend groups. Q 15 minute checks were done for safety.   R:. Pt is taking medication.Pt receptive to treatment and safety maintained on unit. Pt got some relief after tylenol 6 out of 10.

## 2013-02-19 NOTE — ED Notes (Signed)
More easily aroused, is aware that he has been accepted to [facility] and will transport

## 2013-02-19 NOTE — Tx Team (Signed)
Initial Interdisciplinary Treatment Plan  PATIENT STRENGTHS: (choose at least two) Ability for insight General fund of knowledge Motivation for treatment/growth  PATIENT STRESSORS: Financial difficulties Substance abuse Traumatic event   PROBLEM LIST: Problem List/Patient Goals Date to be addressed Date deferred Reason deferred Estimated date of resolution  Substance abuse      Homicidal Ideation                                                 DISCHARGE CRITERIA:  Ability to meet basic life and health needs Improved stabilization in mood, thinking, and/or behavior Motivation to continue treatment in a less acute level of care  PRELIMINARY DISCHARGE PLAN: Attend aftercare/continuing care group Attend 12-step recovery group Outpatient therapy  PATIENT/FAMIILY INVOLVEMENT: This treatment plan has been presented to and reviewed with the patient, Thomas [name].  The patient and family have been given the opportunity to ask questions and make suggestions.  Harold Barban E 02/19/2013, 4:21 PM

## 2013-02-19 NOTE — ED Notes (Signed)
Pt sleeping soundly, arousable, but is not able to wake up enough to cooperate w/ assessment.  Dr Elsie Saas into see.

## 2013-02-20 DIAGNOSIS — F4310 Post-traumatic stress disorder, unspecified: Secondary | ICD-10-CM

## 2013-02-20 DIAGNOSIS — F1920 Other psychoactive substance dependence, uncomplicated: Secondary | ICD-10-CM

## 2013-02-20 DIAGNOSIS — F319 Bipolar disorder, unspecified: Secondary | ICD-10-CM

## 2013-02-20 MED ORDER — QUETIAPINE FUMARATE 50 MG PO TABS
50.0000 mg | ORAL_TABLET | Freq: Two times a day (BID) | ORAL | Status: DC
  Administered 2013-02-20 – 2013-02-22 (×5): 50 mg via ORAL
  Filled 2013-02-20 (×11): qty 1

## 2013-02-20 MED ORDER — DIVALPROEX SODIUM ER 250 MG PO TB24
250.0000 mg | ORAL_TABLET | Freq: Two times a day (BID) | ORAL | Status: DC
  Administered 2013-02-20 – 2013-02-22 (×6): 250 mg via ORAL
  Filled 2013-02-20 (×12): qty 1

## 2013-02-20 MED ORDER — IBUPROFEN 600 MG PO TABS
600.0000 mg | ORAL_TABLET | Freq: Four times a day (QID) | ORAL | Status: DC | PRN
  Administered 2013-02-22 – 2013-02-25 (×3): 600 mg via ORAL
  Filled 2013-02-20 (×3): qty 1

## 2013-02-20 MED ORDER — TRAZODONE HCL 50 MG PO TABS
50.0000 mg | ORAL_TABLET | Freq: Once | ORAL | Status: AC
  Administered 2013-02-20: 50 mg via ORAL
  Filled 2013-02-20 (×2): qty 1

## 2013-02-20 MED ORDER — QUETIAPINE FUMARATE 100 MG PO TABS
100.0000 mg | ORAL_TABLET | Freq: Every day | ORAL | Status: DC
  Administered 2013-02-20 – 2013-02-23 (×4): 100 mg via ORAL
  Filled 2013-02-20 (×5): qty 1
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
## 2013-02-20 NOTE — Progress Notes (Signed)
D: Patient appropriate and cooperative with staff and peers. Patient's affect/mood is flat and depressed. He declined self inventory sheet today. Patient did not attend groups, but is compliant with medication regimen.  A: Support and encouragement provided to patient. Administered scheduled medications per ordering MD. Monitor Q15 minute checks for safety.  R: Patient receptive. Denies SI/HI/AVH. Patient remains safe on the unit.

## 2013-02-20 NOTE — BHH Suicide Risk Assessment (Signed)
Suicide Risk Assessment  Admission Assessment     Nursing information obtained from:  Patient Demographic factors:  Male;Low socioeconomic status;Living alone;Unemployed Current Mental Status:  Self-harm thoughts Loss Factors:  Financial problems / change in socioeconomic status Historical Factors:  Family history of mental illness or substance abuse Risk Reduction Factors:  Sense of responsibility to family;Positive social support  CLINICAL FACTORS:   Severe Anxiety and/or Agitation Alcohol/Substance Abuse/Dependencies  COGNITIVE FEATURES THAT CONTRIBUTE TO RISK:  Closed-mindedness Polarized thinking Thought constriction (tunnel vision)    SUICIDE RISK:   Moderate:  Frequent suicidal ideation with limited intensity, and duration, some specificity in terms of plans, no associated intent, good self-control, limited dysphoria/symptomatology, some risk factors present, and identifiable protective factors, including available and accessible social support.  PLAN OF CARE: Supportive approach/coping skills/relapse prevention                              Pursue detox/reassess co morbidities  I certify that inpatient services furnished can reasonably be expected to improve the patient's condition.  Skyelar Swigart A 02/20/2013, 5:44 PM

## 2013-02-20 NOTE — Progress Notes (Signed)
Pt very anxious, states he did not sleep any last night, and this was verified on 15 min check sheet with 0 hrs sleep.
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
## 2013-02-20 NOTE — Progress Notes (Addendum)
Pt complains of knee pain, requested ace bandage or brace to help stabilize the knee. Pt was offered hot/cold pack, but pt refused. Pt given towels to place between knees and/or to wrap the knee.Marland KitchenMarland Kitchen

## 2013-02-20 NOTE — Progress Notes (Signed)
Late entry for 02/19/13 [facility] ED visit Pt listed as [place].   [facility] liaison, Stacy, spoke with pt. Pt offered [facility] services to assist with finding a [place]    

## 2013-02-21 LAB — GC/CHLAMYDIA PROBE AMP: GC Probe RNA: NEGATIVE

## 2013-02-21 NOTE — Progress Notes (Signed)
Psychoeducational Group Note  Date:  02/21/2013 Time:  0945 am  Group Topic/Focus:  Identifying Needs:   The focus of this group is to help patients identify their personal needs that have been historically problematic and identify healthy behaviors to address their needs.  Participation Level:  Did Not Attend   Andrena Mews 02/21/2013,3:18 PM

## 2013-02-21 NOTE — Progress Notes (Signed)
D.  Pt in room eating dinner on approach, denies complaints at this time.  Took medication as ordered, did not attend group.  Denies SI/HI/hallucinations at this time.  Guarded, minimal interaction.  A.  Support and encouragement offered R.  Will continue to monitor, Pt remains safe on unit.
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
## 2013-02-21 NOTE — [facility] Group Notes (Signed)
[facility] Group Notes: (Clinical Social Work)   02/21/2013      Type of Therapy:  Group Therapy   Participation Level:  Did Not Attend    Lyle Leisner Grossman-Orr, LCSW 02/21/2013, 11:19 AM     

## 2013-02-21 NOTE — Progress Notes (Signed)
Adult Psychoeducational Group Note  Date:  02/21/2013 Time:  0900  Group Topic/Focus:  Recovery Goals:   The focus of this group is to identify appropriate goals for recovery and establish a plan to achieve them. Self Inventory  Participation Level:  Did Not Attend  Pt is unable to attend due to withdrawal symptoms. Thomas [name] [name] 02/21/2013, 10:53 AM

## 2013-02-21 NOTE — Progress Notes (Addendum)
Patient ID: Thomas [name], male   DOB: 1970/12/25, 42 y.o.   MRN: [id number] D: Pt is awake and active on the unit this AM. Pt denies SI/HI and A/V hallucinations. Pt did not complete his self inventory and is resistant to programming. Pt stayed in bed all day and did not attend groups. He is experiencing withdrawals and exhaustion, so writer allowed him to rest. Pt's most recent CIWA score was 4. Pt is irritable and defensive and agitated. Writer brought meds to his room.  A: Encouraged pt to discuss feelings with staff and administered medication per MD orders. Writer also encouraged pt to participate in groups.  R: Pt is attending groups and tolerating medications well. Writer will continue to monitor. 15 minute checks are ongoing for safety.

## 2013-02-21 NOTE — Progress Notes (Signed)
Patient ID: GOVIND FUREY, male   DOB: [birth year], 42 y.o.   MRN: [id number] D: Patient denies SI/HI/AVH.  Pt denies any s/s of withdrawals. Pt presented with depressed mood and flat affect. Pt denies any needs or concerns.  Cooperative with assessment. No acute distressed noted at this time.   A: Met with pt 1:1. Medications administered as prescribed. Writer encouraged pt to discuss feelings. Pt encouraged to come to staff with any question or concerns. 15 minutes checks for safety.  R: Patient remains safe. He is complaint with medications and denies any adverse reaction. Continue current POC.

## 2013-02-22 DIAGNOSIS — F1910 Other psychoactive substance abuse, uncomplicated: Secondary | ICD-10-CM

## 2013-02-22 DIAGNOSIS — F1994 Other psychoactive substance use, unspecified with psychoactive substance-induced mood disorder: Secondary | ICD-10-CM

## 2013-02-22 DIAGNOSIS — F1010 Alcohol abuse, uncomplicated: Principal | ICD-10-CM

## 2013-02-22 NOTE — Progress Notes (Addendum)
Patient ID: Thomas [name], male   DOB: December 25, 1970, 42 y.o.   MRN: [id number] D: Pt is awake and active on the unit this AM. Pt endorses passive SI but he is able to contract for safety. Pt rates their depression at 8 and hopelessness at 7. Pt's most recent CIWA score was 9. Pt mood is depressed and his affect is blunted. Pt has poor communication skills and is very defensive and guarded. Pt is preoccupied with blaming others and is unable to stay awake during group. He is experiencing withdrawal symptoms but is ambulating on crutches with encouragement. Pt is weak and is still spending considerable time in bed resting.   A: Encouraged pt to discuss feelings with staff and administered medication per MD orders. Writer also encouraged pt to participate in groups.  R: Pt is attending groups and tolerating medications well. Writer will continue to monitor. 15 minute checks are ongoing for safety.

## 2013-02-22 NOTE — Progress Notes (Signed)
Psychoeducational Group Note  Date: 02/21/2013 Time:  2100 Group Topic/Focus:  wrap up group  Participation Level: Did Not Attend  Participation Quality:  Not Applicable  Affect:  Not Applicable  Cognitive:  Not Applicable  Insight:  Not Applicable  Engagement in Group: Not Applicable  Additional Comments:  Pt remained in bed asleep.  Thomas [name] 02/22/2013, 2:36 AM

## 2013-02-22 NOTE — Progress Notes (Signed)
Adult Psychoeducational Group Note  Date:  02/22/2013 Time:  0900  Group Topic/Focus:  Relapse Prevention Planning:   The focus of this group is to define relapse and discuss the need for planning to combat relapse.  Participation Level:  Minimal  Participation Quality:  Drowsy and Sharing  Affect:  Depressed  Cognitive:  Oriented  Insight: Limited  Engagement in Group:  Limited  Modes of Intervention:  Discussion and Education  Additional Comments:  Pt slept through most of group but did share some. His insight is very limited.  Neah Sporrer [name] 02/22/2013, 10:41 AM

## 2013-02-23 ENCOUNTER — Encounter (HOSPITAL_COMMUNITY): Payer: Self-pay | Admitting: Psychiatry

## 2013-02-23 DIAGNOSIS — F1410 Cocaine abuse, uncomplicated: Secondary | ICD-10-CM

## 2013-02-23 DIAGNOSIS — F39 Unspecified mood [affective] disorder: Secondary | ICD-10-CM

## 2013-02-23 MED ORDER — DIVALPROEX SODIUM ER 500 MG PO TB24
500.0000 mg | ORAL_TABLET | Freq: Two times a day (BID) | ORAL | Status: DC
  Administered 2013-02-23 – 2013-02-26 (×6): 500 mg via ORAL
  Filled 2013-02-23: qty 28
  Filled 2013-02-23 (×8): qty 1
  Filled 2013-02-23: qty 28

## 2013-02-23 MED ORDER — QUETIAPINE FUMARATE 50 MG PO TABS
50.0000 mg | ORAL_TABLET | Freq: Three times a day (TID) | ORAL | Status: DC
  Administered 2013-02-24 – 2013-02-26 (×8): 50 mg via ORAL
  Filled 2013-02-23 (×4): qty 1
  Filled 2013-02-23: qty 42
  Filled 2013-02-23 (×2): qty 1
  Filled 2013-02-23 (×2): qty 42
  Filled 2013-02-23 (×4): qty 1
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
## 2013-02-23 NOTE — Progress Notes (Signed)
Adult Psychoeducational Group Note  Date:  02/23/2013 Time:  11:48 AM  Group Topic/Focus:  Self Care:   The focus of this group is to help patients understand the importance of self-care in order to improve or restore emotional, physical, spiritual, interpersonal, and financial health.  Participation Level:  Did Not Attend   Additional Comments:  Pt. Did not attend group   Meredith Staggers 02/23/2013, 11:48 AM

## 2013-02-23 NOTE — [facility] Group Notes (Signed)
[facility] LCSW Group Therapy  02/23/2013 1:15 PM  Type of Therapy:  Group Therapy - Topic was Overcoming Obstacles  Participation Level:  Did Not Attend  [name], Thomas Kijowski Nicole 02/23/2013, 1:59 PM   
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
## 2013-02-23 NOTE — Tx Team (Signed)
Interdisciplinary Treatment Plan Update (Adult)  Date: 02/23/2013  Time Reviewed:  9:45 AM  Progress in Treatment: Attending groups: Yes Participating in groups:  Yes Taking medication as prescribed:  Yes Tolerating medication:  Yes Family/Significant othe contact made: No Patient understands diagnosis:  Yes Discussing patient identified problems/goals with staff:  Yes Medical problems stabilized or resolved:  Yes Denies suicidal/homicidal ideation: Yes Issues/concerns per patient self-inventory:  Yes Other:  New problem(s) identified: N/A  Discharge Plan or Barriers: CSW assessing for appropriate follow up for pt.   Reason for Continuation of Hospitalization: Anxiety Depression Medication Stabilization  Comments: N/A  Estimated length of stay: 3-4 days  For review of initial/current patient goals, please see plan of care.  Attendees: Patient:     Family:     Physician:  Dr. Dub Mikes 02/23/2013 1:56 PM   Nursing:   Liborio Nixon, RN 02/23/2013 1:56 PM   Clinical Social Worker:  Reyes Ivan, LCSWA 02/23/2013 1:56 PM   Other: Robbie Louis, RN 02/23/2013 1:57 PM   Other:     Other:     Other:     Other:    Other:    Other:    Other:    Other:    Other:     Scribe for Treatment Team:   Carmina Miller, 02/23/2013 1:57 PM

## 2013-02-23 NOTE — Progress Notes (Signed)
D: Pt denies SI/HI/AVH. Pt continues to lay in bed and rest. Pt have not attended any groups this morning d/t c/o upset stomach and vomiting. Pt v/s taken and noted and NP made aware of pt complaints. A: Medications offered to pt as ordered per MD. Verbal support given. Pt encouraged to drink fluids to increase B/P. 15 minute checks performed for  Safety. R: Pt remains safe at this time.
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
## 2013-02-23 NOTE — Progress Notes (Signed)
Pt in bed sleeping this morning. Pt refuses to take morning meds d/t "upset" stomach.  Pt reports having two loose stools. One last night an another this morning.  Per pt, he just need to rest and sleep it off. Writer will continue to monitor pt status.
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
## 2013-02-23 NOTE — Progress Notes (Signed)
.  [facility] Group Notes:  (Nursing/MHT/Case Management/Adjunct)  Date:  02/22/2013  Time:  2100  Type of Therapy:  wrap up group  Participation Level:  None  Participation Quality:  Attentive  Affect:  Flat, Irritable and Labile  Cognitive:  Alert  Insight:  Limited  Engagement in Group:  Defensive, None and Resistant  Modes of Intervention:  Clarification, Education and Support  Summary of Progress/Problems: When it was pt's turn to speak, pt shouted out "I'm not going to say a fucking word!". Pt was told that at anytime he was allowed to leave a group and return to his room. Pt was also reminded to speak respectfully while in group. Pt was asked why he chose to attend group, but pt did not respond.  Pt left group and group continued without incident.  Another MHT went to speak to patient and pt was upset. Pt reported that he had called his mother and sister today for Mother's Day and they rushed him off the phone with other things to do.  Pt reported to tech that he felt like this Clinical research associate "saved the trash for last" when he was the last one this Clinical research associate called on to share events of the day.   Shelah Lewandowsky 02/23/2013, 12:11 AM

## 2013-02-23 NOTE — Progress Notes (Addendum)
D.  Took over Pt care at 2330.  Pt resting in bed, no distress noted, even and unlabored respirations.  It was reported that he attended group this evening but left early and returned to his room.  A.  Will continue to monitor  R.  Pt remains safe on unit

## 2013-02-23 NOTE — [facility] Group Notes (Signed)
[facility] LCSW Aftercare Discharge Planning Group Note   02/23/2013 8:45 AM   Participation Quality: Did Not Attend  Huong Luthi Horton, LCSWA  02/23/2013  9:20 AM    

## 2013-02-24 MED ORDER — QUETIAPINE FUMARATE 50 MG PO TABS
150.0000 mg | ORAL_TABLET | Freq: Every day | ORAL | Status: DC
  Administered 2013-02-24 – 2013-02-25 (×2): 150 mg via ORAL
  Filled 2013-02-24 (×3): qty 3
  Filled 2013-02-24: qty 42

## 2013-02-24 NOTE — [facility] Group Notes (Signed)
[facility] LCSW Aftercare Discharge Planning Group Note   02/24/2013  8:45 AM  Participation Quality:  Did Not Attend   Clide Dales

## 2013-02-24 NOTE — Progress Notes (Signed)
D:  Patient spent most of the shift in his bed.  He did attend one group, but was minimally interactive.  He has been a bit irritable today.  Refused to fill out his self inventory form this morning.  He is attending meals and has not used the crutches today.   A:  Medications given as scheduled.  Encouraged patient to be up during the day so that he would be able to sleep better at night.   R:  Mostly cooperative with staff.  Not invested in programming at this time.  Interacting well with peers.

## 2013-02-24 NOTE — [facility] Group Notes (Signed)
[facility] LCSW Group Therapy  02/24/2013  1:15 PM  Type of Therapy:  Group Therapy 1:15 to 2:30 PM  Participation Level:  Minimal  Participation Quality:  Attentive and later Drowsy  Affect:  Flat  Cognitive:  Oriented  Insight:  None shared  Engagement in Therapy:  Limited  Modes of Intervention:  Discussion, Education, Exploration and Support  Summary of Progress/Problems: Patient attended group presentation by staff member of  [employer] ([employer]). Thomas [name] was attentive during the first portion of session and later became drowsy to point of nodding off. Thomas [name] did express gratitude to facilitator for coming.    Clide Dales

## 2013-02-24 NOTE — Progress Notes (Signed)
Recreation Therapy Notes  Date: 05.13.2014 Time: 3:00pm Location: [address] Dayroom  Group Topic/Focus: Problem Solving  Participation Level:  Did not attend  Neaveh Belanger L Taccara Bushnell, LRT/CTRS  Hakim Minniefield L 02/24/2013 5:31 PM 

## 2013-02-24 NOTE — Progress Notes (Signed)
Adult Psychoeducational Group Note  Date:  02/24/2013 Time:  12:00 PM  Group Topic/Focus:  Recovery Goals:   The focus of this group is to identify appropriate goals for recovery and establish a plan to achieve them.  Participation Level:  Did Not Attend   Barth Kirks 02/24/2013, 12:00 PM
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
## 2013-02-25 LAB — VALPROIC ACID LEVEL: Valproic Acid Lvl: 57.1 ug/mL (ref 50.0–100.0)

## 2013-02-25 NOTE — Progress Notes (Signed)
Patient ID: Thomas [name], male   DOB: 1971-05-30, 42 y.o.   MRN: [id number] He has been in bed sleeping all day. Said that he had not slept last night. He was encouraged to get up and go to groups but has only been to the activity group. He did shower after group. He denies withdrawal problems. He was irritable this Am but is calm and cooperative at present. Self inventory this am was: depression 8 denies SI thoughts.

## 2013-02-25 NOTE — Progress Notes (Signed)
Patient ID: Thomas [name], male   DOB: 10-15-1971, 42 y.o.   MRN: [id number]   D:  Pt informed the writer that he was still depressed. Stated that he's not going to "rush the dr's" to discharge him, but that if they do he'll have things in place. When asked to clarify, pt was referring to placement. "I'm ok if they do".   A:  Support and encouragement was offered. 15 min checks continued for safety.  R: Pt remains safe.

## 2013-02-25 NOTE — [facility] Group Notes (Signed)
[facility] LCSW Group Therapy  02/25/2013 1:15  PM  Type of Therapy:  Group Therapy 1:15 to 2:30 PM  Participation Level:  Did Not Attend   Thomas [name]   

## 2013-02-25 NOTE — [facility] Group Notes (Signed)
[facility] LCSW Aftercare Discharge Planning Group Note   02/25/2013 8:45 AM  Participation Quality:  Minimla  Mood/Affect:  Irritable  Depression Rating:  10; later reported his "depression to be really a 4 I was just angry"  Anxiety Rating:  1  Thoughts of Suicide:  No Will you contract for safety?   NA  Current AVH:  NA  Plan for Discharge/Comments:  Sister's or friend's   Transportation Means: Bus or fiend to pick up  Supports:sister, friend  Dyane Dustman, Julious Payer
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
## 2013-02-25 NOTE — Progress Notes (Signed)
Adult Psychoeducational Group Note  Date:  02/25/2013 Time:  4:18 PM  Group Topic/Focus:  Crisis Planning:   The purpose of this group is to help patients create a crisis plan for use upon discharge or in the future, as needed.  Participation Level:  Did Not Attend  Additional Comments:  Pt did not attend group.  He was prompted several times that group was being held in the dayroom. Pt choose to sleep through this group.   Dalia Heading 02/25/2013, 4:18 PM

## 2013-02-26 MED ORDER — IBUPROFEN 200 MG PO TABS: 600.0000 mg | ORAL_TABLET | Freq: Three times a day (TID) | ORAL | Status: DC | PRN

## 2013-02-26 MED ORDER — TRAZODONE HCL 50 MG PO TABS: 50.0000 mg | ORAL_TABLET | Freq: Every day | ORAL | Status: DC

## 2013-02-26 MED ORDER — QUETIAPINE FUMARATE 50 MG PO TABS: ORAL_TABLET | ORAL | Status: DC

## 2013-02-26 MED ORDER — DIVALPROEX SODIUM ER 500 MG PO TB24: 500.0000 mg | ORAL_TABLET | Freq: Two times a day (BID) | ORAL | Status: DC

## 2013-02-26 NOTE — [facility] Suicide Risk Assessment (Signed)
[facility] INPATIENT:  Family/Significant Other Suicide Prevention Education  Suicide Prevention Education:  Patient Refusal for Family/Significant Other Suicide Prevention Education: The patient Thomas [name] has refused to provide written consent for family/significant other to be provided Family/Significant Other Suicide Prevention Education during admission and/or prior to discharge.  Physician notified. Writer provided suicide prevention education directly to patient; conversation included risk factors, warning signs and resources to contact for help. Mobile crisis services explained and Suicide Prevention Pamphlet provided to patient with 24 hour mobile crisis contact information on the front.  Clide Dales 02/26/2013, 9:59 AM

## 2013-02-26 NOTE — [facility] Group Notes (Signed)
[facility] LCSW Group Therapy  02/26/2013  1:15 PM   Type of Therapy:  Group Therapy  Participation Level:  Minimal  Participation Quality:  Appropriate and Attentive  Affect:  Appropriate  Cognitive:  Alert and Appropriate  Insight:  Developing/Improving and Engaged  Engagement in Therapy:  Developing/Improving and Engaged  Modes of Intervention:  Clarification, Confrontation, Discussion, Education, Exploration, Limit-setting, Orientation, Problem-solving, Rapport Building, Socialization and Support  Summary of Progress/Problems: The topic for group was balance in life.  Pt participated in the discussion about when their life was in balance and out of balance and how this feels.  Pt discussed ways to get back in balance and short term goals they can work on to get where they want to be.  Pt came into group during the last few minutes of group and when asked what his name was pt stated "I'm about to get the H-E-L-L out of here!".  Pt had to be redirected from having side conversations with a peer.  Pt listened to group discussion but did not participate.    Thomas [name], LCSWA 02/26/2013 2:30 PM
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
## 2013-02-26 NOTE — Progress Notes (Signed)
Patient ID: Thomas [name], male   DOB: 04-Oct-1971, 42 y.o.   MRN: [id number] Nursing D/C note- Patient cooperatve during d/c process. Denies SI and states all treatment goals were met.  All belongings returned and sample meds with Rx's provided. Escorted to lobby with bus pass.

## 2013-02-26 NOTE — Progress Notes (Signed)
Patient ID: Thomas [name], male   DOB: December 12, 1970, 42 y.o.   MRN: [id number]  D: Pt was pleasant, but had to be redirected several times. Pt had to be asked several times not to encourage the bad behavior of one of his peers. When asked to describe his day pt stated, "alright", but went back to entertaining his peers.  A 15 min checks continued for safety.  R: Pt remains safe.
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
## 2013-02-26 NOTE — Progress Notes (Signed)
Recreation Therapy Notes  Date: 05.15.2014 Time: 3:00pm Location: [address] Dayroom      Group Topic/Focus: Leisure Education  Participation Level: Minimal  Participation Quality: Sleeping to Apporopriate  Affect: Sleepy, Euthymic  Cognitive: Appropriate   Additional Comments: Activity: Leisure Office manager ; Explanation: Patients as a group created a list of recreation/leisure activities to correspond with each letter of the alphabet.   Patient was in the day room when LRT arrived sleeping in a chair. Patient slept through activity explanation. Patient awoke at approximately 3:50pm and contributed to group list.   Jearl Klinefelter, LRT/CTRS  Jearl Klinefelter 02/26/2013 4:53 PM

## 2013-02-26 NOTE — [facility] Group Notes (Signed)
[facility] LCSW Aftercare Discharge Planning Group Note   02/26/2013 8:48 AM  Participation Quality:  Did Not Attend  CSW Checked in with patient one to one Mood/Affect:  Appropriate  Depression Rating:  3  Anxiety Rating:  2  Thoughts of Suicide:  No Will you contract for safety?   NA  Current AVH:  NA  Plan for Discharge/Comments:  Sister's home, otherwise discharge to friend's home if he is still unable to reach sister by phone. Patient will follow up at [employer]:  Bus Voucher   Supports: Sibling, couple of friends  Dyane Dustman, Julious Payer

## 2013-03-03 NOTE — Progress Notes (Signed)
Patient Discharge Instructions:  After Visit Summary (AVS):   Faxed to:  03/03/13 Discharge Summary Note:   Faxed to:  03/03/13 Psychiatric Admission Assessment Note:   Faxed to:  03/03/13 Suicide Risk Assessment - Discharge Assessment:   Faxed to:  03/03/13 Faxed/Sent to the Next Level Care provider:  03/03/13 Faxed to [facility] @ [phone]  Jerelene Redden, 03/03/2013, 3:48 PM

## 2013-03-11 ENCOUNTER — Encounter (HOSPITAL_COMMUNITY): Payer: Self-pay | Admitting: Emergency Medicine

## 2013-03-11 ENCOUNTER — Emergency Department (HOSPITAL_COMMUNITY)
Admission: EM | Admit: 2013-03-11 | Discharge: 2013-03-11 | Disposition: A | Discharge status: Home / Self Care | Payer: Self-pay | Attending: Emergency Medicine | Admitting: Emergency Medicine

## 2013-03-11 DIAGNOSIS — F319 Bipolar disorder, unspecified: Secondary | ICD-10-CM | POA: Insufficient documentation

## 2013-03-11 DIAGNOSIS — F1910 Other psychoactive substance abuse, uncomplicated: Secondary | ICD-10-CM

## 2013-03-11 DIAGNOSIS — F10229 Alcohol dependence with intoxication, unspecified: Secondary | ICD-10-CM | POA: Insufficient documentation

## 2013-03-11 DIAGNOSIS — Z9889 Other specified postprocedural states: Secondary | ICD-10-CM | POA: Insufficient documentation

## 2013-03-11 DIAGNOSIS — F1410 Cocaine abuse, uncomplicated: Secondary | ICD-10-CM | POA: Insufficient documentation

## 2013-03-11 DIAGNOSIS — Z79899 Other long term (current) drug therapy: Secondary | ICD-10-CM | POA: Insufficient documentation

## 2013-03-11 DIAGNOSIS — G8929 Other chronic pain: Secondary | ICD-10-CM | POA: Insufficient documentation

## 2013-03-11 DIAGNOSIS — F17200 Nicotine dependence, unspecified, uncomplicated: Secondary | ICD-10-CM | POA: Insufficient documentation

## 2013-03-11 DIAGNOSIS — Z87828 Personal history of other (healed) physical injury and trauma: Secondary | ICD-10-CM | POA: Insufficient documentation

## 2013-03-11 LAB — RAPID URINE DRUG SCREEN, HOSP PERFORMED
Amphetamines: NOT DETECTED
Benzodiazepines: NOT DETECTED
Cocaine: POSITIVE — AB
Opiates: NOT DETECTED

## 2013-03-11 LAB — CBC WITH DIFFERENTIAL/PLATELET
Basophils Absolute: 0.1 10*3/uL (ref 0.0–0.1)
HCT: 39.8 % (ref 39.0–52.0)
Lymphocytes Relative: 47 % — ABNORMAL HIGH (ref 12–46)
Lymphs Abs: 4 10*3/uL (ref 0.7–4.0)
Neutro Abs: 3.6 10*3/uL (ref 1.7–7.7)
Platelets: 251 10*3/uL (ref 150–400)
RBC: 4.51 MIL/uL (ref 4.22–5.81)
RDW: 14.2 % (ref 11.5–15.5)
WBC: 8.6 10*3/uL (ref 4.0–10.5)

## 2013-03-11 LAB — ETHANOL: Alcohol, Ethyl (B): 318 mg/dL — ABNORMAL HIGH (ref 0–11)

## 2013-03-11 LAB — BASIC METABOLIC PANEL
CO2: 23 mEq/L (ref 19–32)
Chloride: 106 mEq/L (ref 96–112)
Glucose, Bld: 91 mg/dL (ref 70–99)
Sodium: 142 mEq/L (ref 135–145)

## 2013-03-11 MED ORDER — ZIPRASIDONE MESYLATE 20 MG IM SOLR
20.0000 mg | Freq: Once | INTRAMUSCULAR | Status: AC
  Administered 2013-03-11: 20 mg via INTRAMUSCULAR
  Filled 2013-03-11: qty 20
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
## 2013-03-11 NOTE — ED Notes (Signed)
Per EDP Campos, hold off on telepysch consult until pt is sober.

## 2013-03-11 NOTE — ED Notes (Signed)
When trying to go over pt's discharge instructions pt refused. Stating " I know!"
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
## 2013-03-11 NOTE — ED Notes (Signed)
Unable to assess patient due to his mental status.

## 2013-03-11 NOTE — Progress Notes (Signed)
[facility] has seen patient and provided him with a list of Primary Care Resources. Patient was not very receptive.

## 2013-03-11 NOTE — ED Notes (Signed)
Patient brought in by [employer]. Patient picked up at a convenience store, agitated. Patient hx bipolar and has been off his meds for several days, or weeks
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
## 2013-06-08 ENCOUNTER — Emergency Department (HOSPITAL_COMMUNITY)
Admission: EM | Admit: 2013-06-08 | Discharge: 2013-06-09 | Disposition: A | Discharge status: Home / Self Care | Payer: Self-pay | Attending: Emergency Medicine | Admitting: Emergency Medicine

## 2013-06-08 DIAGNOSIS — Z23 Encounter for immunization: Secondary | ICD-10-CM | POA: Insufficient documentation

## 2013-06-08 DIAGNOSIS — F10929 Alcohol use, unspecified with intoxication, unspecified: Secondary | ICD-10-CM

## 2013-06-08 DIAGNOSIS — X58XXXA Exposure to other specified factors, initial encounter: Secondary | ICD-10-CM | POA: Insufficient documentation

## 2013-06-08 DIAGNOSIS — F17200 Nicotine dependence, unspecified, uncomplicated: Secondary | ICD-10-CM | POA: Insufficient documentation

## 2013-06-08 DIAGNOSIS — F1410 Cocaine abuse, uncomplicated: Secondary | ICD-10-CM

## 2013-06-08 DIAGNOSIS — Y9389 Activity, other specified: Secondary | ICD-10-CM | POA: Insufficient documentation

## 2013-06-08 DIAGNOSIS — Z87828 Personal history of other (healed) physical injury and trauma: Secondary | ICD-10-CM | POA: Insufficient documentation

## 2013-06-08 DIAGNOSIS — F1010 Alcohol abuse, uncomplicated: Secondary | ICD-10-CM

## 2013-06-08 DIAGNOSIS — T148XXA Other injury of unspecified body region, initial encounter: Secondary | ICD-10-CM

## 2013-06-08 DIAGNOSIS — G8929 Other chronic pain: Secondary | ICD-10-CM | POA: Insufficient documentation

## 2013-06-08 DIAGNOSIS — F319 Bipolar disorder, unspecified: Secondary | ICD-10-CM | POA: Insufficient documentation

## 2013-06-08 DIAGNOSIS — IMO0002 Reserved for concepts with insufficient information to code with codable children: Secondary | ICD-10-CM | POA: Insufficient documentation

## 2013-06-08 DIAGNOSIS — F10229 Alcohol dependence with intoxication, unspecified: Secondary | ICD-10-CM | POA: Insufficient documentation

## 2013-06-08 DIAGNOSIS — Y929 Unspecified place or not applicable: Secondary | ICD-10-CM | POA: Insufficient documentation

## 2013-06-08 NOTE — ED Notes (Signed)
Bed: [facility]4 Expected date: 06/08/13 Expected time: 11:39 PM Means of arrival: Ambulance Comments: Bipolar/off meds
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
## 2013-06-09 ENCOUNTER — Encounter (HOSPITAL_COMMUNITY): Payer: Self-pay | Admitting: Emergency Medicine

## 2013-06-09 ENCOUNTER — Emergency Department (HOSPITAL_COMMUNITY): Payer: Self-pay

## 2013-06-09 DIAGNOSIS — F1010 Alcohol abuse, uncomplicated: Secondary | ICD-10-CM

## 2013-06-09 LAB — RAPID URINE DRUG SCREEN, HOSP PERFORMED
Barbiturates: NOT DETECTED
Cocaine: NOT DETECTED
Opiates: NOT DETECTED
Tetrahydrocannabinol: NOT DETECTED

## 2013-06-09 LAB — CBC WITH DIFFERENTIAL/PLATELET
Hemoglobin: 15.8 g/dL (ref 13.0–17.0)
Lymphocytes Relative: 33 % (ref 12–46)
Lymphs Abs: 2.5 10*3/uL (ref 0.7–4.0)
MCV: 91.2 fL (ref 78.0–100.0)
Monocytes Relative: 5 % (ref 3–12)
Neutrophils Relative %: 60 % (ref 43–77)
Platelets: 241 10*3/uL (ref 150–400)
RBC: 4.99 MIL/uL (ref 4.22–5.81)
WBC: 7.8 10*3/uL (ref 4.0–10.5)

## 2013-06-09 LAB — ETHANOL
Alcohol, Ethyl (B): 223 mg/dL — ABNORMAL HIGH (ref 0–11)
Alcohol, Ethyl (B): 155 mg/dL — ABNORMAL HIGH (ref 0–11)

## 2013-06-09 LAB — BASIC METABOLIC PANEL
CO2: 24 mEq/L (ref 19–32)
Calcium: 9.5 mg/dL (ref 8.4–10.5)
Creatinine, Ser: 0.82 mg/dL (ref 0.50–1.35)
GFR calc non Af Amer: >90 mL/min (ref >90)
Sodium: 135 mEq/L (ref 135–145)

## 2013-06-09 MED ORDER — ZIPRASIDONE MESYLATE 20 MG IM SOLR
INTRAMUSCULAR | Status: AC
  Administered 2013-06-09: 20 mg
  Filled 2013-06-09: qty 20

## 2013-06-09 MED ORDER — ZIPRASIDONE MESYLATE 20 MG IM SOLR: 10.0000 mg | Freq: Once | INTRAMUSCULAR | Status: DC

## 2013-06-09 MED ORDER — TETANUS-DIPHTH-ACELL PERTUSSIS 5-2.5-18.5 LF-MCG/0.5 IM SUSP
0.5000 mL | Freq: Once | INTRAMUSCULAR | Status: AC
  Administered 2013-06-09: 0.5 mL via INTRAMUSCULAR
  Filled 2013-06-09: qty 0.5

## 2013-06-09 MED ORDER — ZIPRASIDONE MESYLATE 20 MG IM SOLR: 20.0000 mg | Freq: Once | INTRAMUSCULAR | Status: DC
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
## 2013-06-09 NOTE — ED Provider Notes (Signed)
Medical screening examination/treatment/procedure(s) were performed by non-physician practitioner and as supervising physician I was immediately available for consultation/collaboration.   Marissa Weaver, MD 06/09/13 0741 

## 2013-06-09 NOTE — ED Provider Notes (Signed)
6:28 AM Filed Vitals:   06/09/13 0001 06/09/13 0130 06/09/13 0557  BP: 109/74 104/57 92/59  Pulse: 101 99 81  Temp: 98.2 F (36.8 C)    TempSrc: Oral    Resp: 20 18 18   SpO2: 99% 100% 97%   Assumed care of the patient from [facility]. Thomas [name] is a 42 year old male who arrived to the ED with alcohol intoxication and multiple abrasions. The patient exhibited belligerent behavior and aggressive behavior received 20 mg IM Geodon.  CT of the C-spine, maxillofacial and head are all without acute abnormality. Patient's initial ethanol level 326 with a slightly elevated glucose labs are otherwise unremarkable.  Awaiting patient's ethanol levels to drop.  If he is clinically sober he will be able to be discharged.   I was able to arouse the patient and have him to adjust his arm which improved his blood pressure   10:12 AM Filed Vitals:   06/09/13 0001 06/09/13 0130 06/09/13 0557 06/09/13 0646  BP: 109/74 104/57 92/59 102/71  Pulse: 101 99 81 72  Temp: 98.2 F (36.8 C)     TempSrc: Oral     Resp: 20 18 18 18   SpO2: 99% 100% 97% 98%   Patient able to ambulate without assistance. He became beligerent at discharge because he was awoken.  Patient is safe for d/c and follow up with [facility]  The patient appears reasonably screened and/or stabilized for discharge and I doubt any other medical condition or other [facility] requiring further screening, evaluation, or treatment in the ED at this time prior to discharge. The primary encounter diagnosis was Alcohol abuse. Diagnoses of Alcohol intoxication and Abrasion were also pertinent to this visit.   Thomas Captain, PA-C 06/09/13 1114

## 2013-06-09 NOTE — ED Notes (Signed)
Pt refuses to be ambulated; refused VS. Getting himself dressed and wants to go. PA being notified.

## 2013-06-09 NOTE — Progress Notes (Signed)
[facility] provided patient with a list of primary care resources, highlighting the [facility].
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
## 2013-06-09 NOTE — ED Notes (Signed)
Pt walked himself to the bathroom.

## 2013-06-10 NOTE — ED Provider Notes (Signed)
Medical screening examination/treatment/procedure(s) were performed by non-physician practitioner and as supervising physician I was immediately available for consultation/collaboration.   Eliga Arvie, MD 06/10/13 0638 

## 2013-06-11 NOTE — Consult Note (Signed)
Note reviewed and agreed with  

## 2015-03-18 ENCOUNTER — Emergency Department (HOSPITAL_COMMUNITY): Payer: Self-pay

## 2015-03-18 ENCOUNTER — Encounter (HOSPITAL_COMMUNITY): Payer: Self-pay | Admitting: Emergency Medicine

## 2015-03-18 ENCOUNTER — Emergency Department (HOSPITAL_COMMUNITY)
Admission: EM | Admit: 2015-03-18 | Discharge: 2015-03-19 | Disposition: A | Discharge status: Other Acute Inpatient Hospital | Payer: Self-pay | Attending: Emergency Medicine | Admitting: Emergency Medicine

## 2015-03-18 DIAGNOSIS — Y9389 Activity, other specified: Secondary | ICD-10-CM | POA: Insufficient documentation

## 2015-03-18 DIAGNOSIS — G8929 Other chronic pain: Secondary | ICD-10-CM | POA: Insufficient documentation

## 2015-03-18 DIAGNOSIS — R4689 Other symptoms and signs involving appearance and behavior: Secondary | ICD-10-CM

## 2015-03-18 DIAGNOSIS — Y998 Other external cause status: Secondary | ICD-10-CM | POA: Insufficient documentation

## 2015-03-18 DIAGNOSIS — S3992XA Unspecified injury of lower back, initial encounter: Secondary | ICD-10-CM | POA: Insufficient documentation

## 2015-03-18 DIAGNOSIS — Z720 Tobacco use: Secondary | ICD-10-CM | POA: Insufficient documentation

## 2015-03-18 DIAGNOSIS — S60512A Abrasion of left hand, initial encounter: Secondary | ICD-10-CM | POA: Insufficient documentation

## 2015-03-18 DIAGNOSIS — S80211A Abrasion, right knee, initial encounter: Secondary | ICD-10-CM | POA: Insufficient documentation

## 2015-03-18 DIAGNOSIS — S199XXA Unspecified injury of neck, initial encounter: Secondary | ICD-10-CM | POA: Insufficient documentation

## 2015-03-18 DIAGNOSIS — T1490XA Injury, unspecified, initial encounter: Secondary | ICD-10-CM

## 2015-03-18 DIAGNOSIS — S70212A Abrasion, left hip, initial encounter: Secondary | ICD-10-CM | POA: Insufficient documentation

## 2015-03-18 DIAGNOSIS — X810XXA Intentional self-harm by jumping or lying in front of motor vehicle, initial encounter: Secondary | ICD-10-CM | POA: Insufficient documentation

## 2015-03-18 DIAGNOSIS — S60511A Abrasion of right hand, initial encounter: Secondary | ICD-10-CM | POA: Insufficient documentation

## 2015-03-18 DIAGNOSIS — Z79899 Other long term (current) drug therapy: Secondary | ICD-10-CM | POA: Insufficient documentation

## 2015-03-18 DIAGNOSIS — F319 Bipolar disorder, unspecified: Secondary | ICD-10-CM | POA: Insufficient documentation

## 2015-03-18 DIAGNOSIS — R4589 Other symptoms and signs involving emotional state: Secondary | ICD-10-CM

## 2015-03-18 DIAGNOSIS — Y92410 Unspecified street and highway as the place of occurrence of the external cause: Secondary | ICD-10-CM | POA: Insufficient documentation

## 2015-03-18 LAB — COMPREHENSIVE METABOLIC PANEL
ALK PHOS: 50 U/L (ref 38–126)
ALT: 19 U/L (ref 17–63)
AST: 27 U/L (ref 15–41)
Albumin: 3.2 g/dL — ABNORMAL LOW (ref 3.5–5.0)
Anion gap: 10 (ref 5–15)
BUN: 6 mg/dL (ref 6–20)
CO2: 24 mmol/L (ref 22–32)
Calcium: 8.3 mg/dL — ABNORMAL LOW (ref 8.9–10.3)
Chloride: 106 mmol/L (ref 101–111)
Creatinine, Ser: 1.02 mg/dL (ref 0.61–1.24)
GFR calc Af Amer: >60 mL/min (ref >60)
GLUCOSE: 96 mg/dL (ref 65–99)
Potassium: 3.6 mmol/L (ref 3.5–5.1)
SODIUM: 140 mmol/L (ref 135–145)
TOTAL PROTEIN: 6 g/dL — AB (ref 6.5–8.1)
Total Bilirubin: 0.4 mg/dL (ref 0.3–1.2)

## 2015-03-18 LAB — I-STAT CHEM 8, ED
BUN: 7 mg/dL (ref 6–20)
CALCIUM ION: 1.06 mmol/L — AB (ref 1.12–1.23)
Chloride: 104 mmol/L (ref 101–111)
Creatinine, Ser: 1.5 mg/dL — ABNORMAL HIGH (ref 0.61–1.24)
GLUCOSE: 93 mg/dL (ref 65–99)
HCT: 50 % (ref 39.0–52.0)
Hemoglobin: 17 g/dL (ref 13.0–17.0)
Potassium: 3.6 mmol/L (ref 3.5–5.1)
SODIUM: 140 mmol/L (ref 135–145)
TCO2: 20 mmol/L (ref 0–100)

## 2015-03-18 LAB — CBC
HCT: 45.7 % (ref 39.0–52.0)
HEMOGLOBIN: 15.7 g/dL (ref 13.0–17.0)
MCH: 31.2 pg (ref 26.0–34.0)
MCHC: 34.4 g/dL (ref 30.0–36.0)
MCV: 90.7 fL (ref 78.0–100.0)
PLATELETS: 222 10*3/uL (ref 150–400)
RBC: 5.04 MIL/uL (ref 4.22–5.81)
RDW: 13.9 % (ref 11.5–15.5)
WBC: 7.2 10*3/uL (ref 4.0–10.5)

## 2015-03-18 LAB — PROTIME-INR
INR: 0.99 (ref 0.00–1.49)
PROTHROMBIN TIME: 13.3 s (ref 11.6–15.2)

## 2015-03-18 LAB — SAMPLE TO BLOOD BANK

## 2015-03-18 LAB — ETHANOL: Alcohol, Ethyl (B): 259 mg/dL — ABNORMAL HIGH (ref <5)

## 2015-03-18 LAB — CDS SEROLOGY

## 2015-03-18 MED ORDER — BACITRACIN 500 UNIT/GM EX OINT
1.0000 "application " | TOPICAL_OINTMENT | Freq: Two times a day (BID) | CUTANEOUS | Status: DC
  Administered 2015-03-19: 1 via TOPICAL
  Filled 2015-03-18 (×3): qty 0.9

## 2015-03-18 MED ORDER — SODIUM CHLORIDE 0.9 % IV BOLUS (SEPSIS)
1000.0000 mL | Freq: Once | INTRAVENOUS | Status: AC
  Administered 2015-03-18: 1000 mL via INTRAVENOUS

## 2015-03-18 MED ORDER — MORPHINE SULFATE 4 MG/ML IJ SOLN
4.0000 mg | Freq: Once | INTRAMUSCULAR | Status: AC
  Administered 2015-03-18: 4 mg via INTRAVENOUS
  Filled 2015-03-18: qty 1

## 2015-03-18 MED ORDER — LORAZEPAM 2 MG/ML IJ SOLN
1.0000 mg | Freq: Once | INTRAMUSCULAR | Status: AC
  Administered 2015-03-18: 1 mg via INTRAVENOUS
  Filled 2015-03-18: qty 1

## 2015-03-18 MED ORDER — IOHEXOL 300 MG/ML  SOLN: 100.0000 mL | Freq: Once | INTRAMUSCULAR | Status: AC | PRN
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
## 2015-03-18 NOTE — ED Notes (Signed)
Called CT to determine when patient can be taken to CT and was informed there are no available rooms at the moment but he is next to be transported.
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
## 2015-03-18 NOTE — BH Assessment (Signed)
Received notification of TTS consult request. Spoke to Dr. Thurnell LoseYow who said Pt is being treated and not ready for TTS. They will take out TTS consult and resubmit when Pt is ready.  Harlin RainFord Ellis Patsy BaltimoreWarrick Jr, LPC, [facility], [facility] Triage Specialist [phone]

## 2015-03-18 NOTE — ED Notes (Signed)
Telepsych in process 

## 2015-03-19 ENCOUNTER — Encounter (HOSPITAL_COMMUNITY): Payer: Self-pay | Admitting: *Deleted

## 2015-03-19 ENCOUNTER — Inpatient Hospital Stay (HOSPITAL_COMMUNITY)
Admission: AD | Admit: 2015-03-19 | Discharge: 2015-03-22 | DRG: 885 | Disposition: A | Discharge status: Home / Self Care | Payer: Federal, State, Local not specified - Other | Source: Intra-hospital | Attending: Psychiatry | Admitting: Psychiatry

## 2015-03-19 ENCOUNTER — Emergency Department (HOSPITAL_COMMUNITY): Payer: Self-pay

## 2015-03-19 ENCOUNTER — Encounter (HOSPITAL_COMMUNITY): Payer: Self-pay

## 2015-03-19 DIAGNOSIS — G4700 Insomnia, unspecified: Secondary | ICD-10-CM | POA: Diagnosis present

## 2015-03-19 DIAGNOSIS — F3177 Bipolar disorder, in partial remission, most recent episode mixed: Secondary | ICD-10-CM | POA: Diagnosis present

## 2015-03-19 DIAGNOSIS — R45851 Suicidal ideations: Secondary | ICD-10-CM

## 2015-03-19 DIAGNOSIS — F17210 Nicotine dependence, cigarettes, uncomplicated: Secondary | ICD-10-CM | POA: Diagnosis present

## 2015-03-19 DIAGNOSIS — T1491XA Suicide attempt, initial encounter: Secondary | ICD-10-CM | POA: Insufficient documentation

## 2015-03-19 DIAGNOSIS — Z23 Encounter for immunization: Secondary | ICD-10-CM

## 2015-03-19 DIAGNOSIS — F1410 Cocaine abuse, uncomplicated: Secondary | ICD-10-CM | POA: Diagnosis present

## 2015-03-19 DIAGNOSIS — F1994 Other psychoactive substance use, unspecified with psychoactive substance-induced mood disorder: Secondary | ICD-10-CM | POA: Diagnosis not present

## 2015-03-19 DIAGNOSIS — Y906 Blood alcohol level of 120-199 mg/100 ml: Secondary | ICD-10-CM | POA: Diagnosis present

## 2015-03-19 DIAGNOSIS — F419 Anxiety disorder, unspecified: Secondary | ICD-10-CM | POA: Diagnosis present

## 2015-03-19 DIAGNOSIS — F3160 Bipolar disorder, current episode mixed, unspecified: Principal | ICD-10-CM | POA: Diagnosis present

## 2015-03-19 DIAGNOSIS — F1099 Alcohol use, unspecified with unspecified alcohol-induced disorder: Secondary | ICD-10-CM

## 2015-03-19 DIAGNOSIS — F10239 Alcohol dependence with withdrawal, unspecified: Secondary | ICD-10-CM | POA: Diagnosis present

## 2015-03-19 DIAGNOSIS — F1010 Alcohol abuse, uncomplicated: Secondary | ICD-10-CM | POA: Diagnosis not present

## 2015-03-19 DIAGNOSIS — T1491 Suicide attempt: Secondary | ICD-10-CM

## 2015-03-19 LAB — RAPID URINE DRUG SCREEN, HOSP PERFORMED
Amphetamines: NOT DETECTED
Barbiturates: NOT DETECTED
Benzodiazepines: NOT DETECTED
Cocaine: NOT DETECTED
OPIATES: NOT DETECTED
TETRAHYDROCANNABINOL: NOT DETECTED

## 2015-03-19 LAB — ETHANOL: ALCOHOL ETHYL (B): 130 mg/dL — AB (ref <5)

## 2015-03-19 MED ORDER — IBUPROFEN 800 MG PO TABS
800.0000 mg | ORAL_TABLET | Freq: Four times a day (QID) | ORAL | Status: DC | PRN
  Administered 2015-03-19 – 2015-03-22 (×7): 800 mg via ORAL
  Filled 2015-03-19 (×8): qty 1

## 2015-03-19 MED ORDER — IBUPROFEN 200 MG PO TABS
600.0000 mg | ORAL_TABLET | Freq: Once | ORAL | Status: AC
  Administered 2015-03-19: 600 mg via ORAL
  Filled 2015-03-19 (×2): qty 1

## 2015-03-19 MED ORDER — TRAZODONE HCL 50 MG PO TABS: 50.0000 mg | ORAL_TABLET | Freq: Every evening | ORAL | Status: DC | PRN

## 2015-03-19 MED ORDER — TRAZODONE HCL 50 MG PO TABS
50.0000 mg | ORAL_TABLET | Freq: Every day | ORAL | Status: DC
  Filled 2015-03-19 (×2): qty 1

## 2015-03-19 MED ORDER — THIAMINE HCL 100 MG/ML IJ SOLN: 100.0000 mg | Freq: Every day | INTRAMUSCULAR | Status: DC

## 2015-03-19 MED ORDER — QUETIAPINE FUMARATE 25 MG PO TABS
25.0000 mg | ORAL_TABLET | Freq: Three times a day (TID) | ORAL | Status: DC
  Administered 2015-03-19 – 2015-03-22 (×8): 25 mg via ORAL
  Filled 2015-03-19 (×3): qty 1
  Filled 2015-03-19: qty 42
  Filled 2015-03-19: qty 1
  Filled 2015-03-19 (×2): qty 42
  Filled 2015-03-19 (×4): qty 1
  Filled 2015-03-19: qty 42
  Filled 2015-03-19: qty 1
  Filled 2015-03-19: qty 42
  Filled 2015-03-19: qty 1
  Filled 2015-03-19: qty 42
  Filled 2015-03-19: qty 1

## 2015-03-19 MED ORDER — LORAZEPAM 1 MG PO TABS: 0.0000 mg | ORAL_TABLET | Freq: Four times a day (QID) | ORAL | Status: DC

## 2015-03-19 MED ORDER — NICOTINE 21 MG/24HR TD PT24
21.0000 mg | MEDICATED_PATCH | Freq: Every day | TRANSDERMAL | Status: DC
  Administered 2015-03-19 – 2015-03-20 (×2): 21 mg via TRANSDERMAL
  Filled 2015-03-19 (×2): qty 1
  Filled 2015-03-19: qty 5
  Filled 2015-03-19 (×2): qty 1
  Filled 2015-03-19: qty 5
  Filled 2015-03-19: qty 1

## 2015-03-19 MED ORDER — ALUM & MAG HYDROXIDE-SIMETH 200-200-20 MG/5ML PO SUSP: 30.0000 mL | ORAL | Status: DC | PRN

## 2015-03-19 MED ORDER — VITAMIN B-1 100 MG PO TABS: 100.0000 mg | ORAL_TABLET | Freq: Every day | ORAL | Status: DC

## 2015-03-19 MED ORDER — ACETAMINOPHEN 325 MG PO TABS
650.0000 mg | ORAL_TABLET | Freq: Four times a day (QID) | ORAL | Status: DC | PRN
  Administered 2015-03-20 – 2015-03-21 (×2): 650 mg via ORAL
  Filled 2015-03-19 (×2): qty 2

## 2015-03-19 MED ORDER — LORAZEPAM 1 MG PO TABS: 0.0000 mg | ORAL_TABLET | Freq: Two times a day (BID) | ORAL | Status: DC

## 2015-03-19 MED ORDER — MAGNESIUM HYDROXIDE 400 MG/5ML PO SUSP: 30.0000 mL | Freq: Every day | ORAL | Status: DC | PRN

## 2015-03-19 MED ORDER — CHLORDIAZEPOXIDE HCL 25 MG PO CAPS
25.0000 mg | ORAL_CAPSULE | Freq: Four times a day (QID) | ORAL | Status: DC | PRN
Start: 2015-03-19 — End: 2015-03-22

## 2015-03-19 MED ORDER — QUETIAPINE FUMARATE 100 MG PO TABS
100.0000 mg | ORAL_TABLET | Freq: Every day | ORAL | Status: DC
  Administered 2015-03-20 – 2015-03-21 (×2): 100 mg via ORAL
  Filled 2015-03-19: qty 14
  Filled 2015-03-19 (×3): qty 1
  Filled 2015-03-19: qty 14

## 2015-03-19 MED ORDER — LORAZEPAM 2 MG/ML IJ SOLN
0.0000 mg | Freq: Four times a day (QID) | INTRAMUSCULAR | Status: DC
Start: 2015-03-19 — End: 2015-03-19

## 2015-03-19 MED ORDER — PNEUMOCOCCAL VAC POLYVALENT 25 MCG/0.5ML IJ INJ: 0.5000 mL | INJECTION | INTRAMUSCULAR | Status: DC

## 2015-03-19 MED ORDER — LORAZEPAM 2 MG/ML IJ SOLN: 0.0000 mg | Freq: Two times a day (BID) | INTRAMUSCULAR | Status: DC

## 2015-03-19 MED ORDER — HYDROXYZINE HCL 25 MG PO TABS
25.0000 mg | ORAL_TABLET | ORAL | Status: DC | PRN
  Administered 2015-03-19 – 2015-03-20 (×3): 25 mg via ORAL
  Filled 2015-03-19 (×3): qty 1
  Filled 2015-03-19: qty 20

## 2015-03-19 MED ORDER — QUETIAPINE FUMARATE 50 MG PO TABS
50.0000 mg | ORAL_TABLET | Freq: Two times a day (BID) | ORAL | Status: DC
  Administered 2015-03-19: 50 mg via ORAL
  Filled 2015-03-19 (×5): qty 1

## 2015-03-19 MED ORDER — TRAZODONE HCL 100 MG PO TABS
100.0000 mg | ORAL_TABLET | Freq: Every day | ORAL | Status: DC
  Administered 2015-03-19 – 2015-03-21 (×3): 100 mg via ORAL
  Filled 2015-03-19: qty 14
  Filled 2015-03-19 (×4): qty 1
  Filled 2015-03-19: qty 14

## 2015-03-19 NOTE — [facility] Group Notes (Signed)
[facility] Group Notes:  (Clinical Social Work)  03/19/2015     10-11AM  Summary of Progress/Problems:   The main focus of today's process group was to learn how to use a decisional balance exercise to move forward in the Stages of Change, which were described and discussed.  Motivational Interviewing and a worksheet were utilized to help patients explore in depth the perceived benefits and costs of unhealthy coping techniques, as well as the  benefits and costs of replacing that with a healthy coping skills.   The patient expressed that his unhealthy coping involves staying around negative people, using alcohol and marijuana. He was drowsy and did not participate much in group unless called on, then was called out to see a practitioner.    Type of Therapy:  Group Therapy - Process   Participation Level:  Minimal  Participation Quality:  Drowsy  Affect:  Blunted  Cognitive:  N/A  Insight:  Limited  Engagement in Therapy:  Limited  Modes of Intervention:  Education, Motivational Interviewing  Ambrose MantleMareida Grossman-Orr, LCSW 03/19/2015, 12:34 PM
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
## 2015-03-19 NOTE — ED Notes (Signed)
Pt requesting pain medication at this time, NP to be notified.
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
## 2015-03-19 NOTE — Progress Notes (Signed)
D: Pt's mood is depressed, flat affect. He denies SI/HI/AVH. He has been isolative to his room and irritable at times.  A: Support given. Verbalization encouraged. Pt encouraged to come to staff for any concerns. Medications given as prescribed. R: Pt is receptive. No complaints of pain or discomfort at this time. Q15 min safety checks maintained. Pt remains safe on the unit. Will continue to monitor.

## 2015-03-19 NOTE — ED Notes (Signed)
Report attempted, RN not ready.
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
## 2015-03-19 NOTE — Progress Notes (Signed)
Pt attended group this evening. 

## 2015-03-19 NOTE — ED Notes (Signed)
[facility] when pt was transferred to [facility] C[facility] from POD A[facility]04, IVC papers given to [employer] as received. [facility] and IVC papers given to [employer] as given by first RN. [facility] reported that IVC papers are incomplete and no examination and recommendation available from ED MD. CN notified.

## 2015-03-19 NOTE — ED Notes (Signed)
Report attempted, per RN, pt needs to be reevaluated after 3 am [facility] NP to be notified.

## 2015-03-19 NOTE — Tx Team (Signed)
Initial Interdisciplinary Treatment Plan   PATIENT STRESSORS: Marital or family conflict Medication change or noncompliance Substance abuse   PATIENT STRENGTHS: Ability for insight Average or above average intelligence Capable of independent living General fund of knowledge   PROBLEM LIST: Problem List/Patient Goals Date to be addressed Date deferred Reason deferred Estimated date of resolution  "my attitude, my anger and get my medications right" 03/19/15     Depresssion 03/19/15     Suicidal Ideation 03/19/15     ETOH abuse 03/19/15                                    DISCHARGE CRITERIA:  Ability to meet basic life and health needs Improved stabilization in mood, thinking, and/or behavior Verbal commitment to aftercare and medication compliance Withdrawal symptoms are absent or subacute and managed without 24-hour nursing intervention  PRELIMINARY DISCHARGE PLAN: Attend aftercare/continuing care group Return to previous living arrangement  PATIENT/FAMIILY INVOLVEMENT: This treatment plan has been presented to and reviewed with the patient, Encarnacion ChuJames E Bartholomew, and/or family member, .  The patient and family have been given the opportunity to ask questions and make suggestions.  Lylah Lantis, [place] 03/19/2015, 6:25 AM

## 2015-03-19 NOTE — [facility] Suicide Risk Assessment (Signed)
[facility]   Nursing information obtained from:    Demographic factors:    Current Mental Status:    Loss Factors:    Historical Factors:    Risk Reduction Factors:    Total Time spent with patient: 30 minutes Principal Problem: <principal problem not specified> Diagnosis:   Patient Active Problem List   Diagnosis Date Noted  . Bipolar I disorder, most recent episode (or current) mixed, in partial or unspecified remission [F31.77] 03/19/2015  . Mood disorder [F39] 03/12/2012  . Alcohol abuse [F10.10] 03/12/2012  . Cocaine abuse [F14.10] 03/12/2012     Continued Clinical Symptoms:  Alcohol Use Disorder Identification Test Final Score (AUDIT): 11 The "Alcohol Use Disorders Identification Test", Guidelines for Use in Primary Care, Second Edition.  World Science writerHealth Organization [facility](WHO). Score between 0-7:  no or low risk or alcohol related problems. Score between 8-15:  moderate risk of alcohol related problems. Score between 16-19:  high risk of alcohol related problems. Score 20 or above:  warrants further diagnostic evaluation for alcohol dependence and treatment.   CLINICAL FACTORS:   Bipolar Disorder:   Depressive phase Depression:   Anhedonia Hopelessness Impulsivity Insomnia Recent sense of peace/wellbeing Severe Unstable or Poor Therapeutic Relationship Previous Psychiatric Diagnoses and Treatments Medical Diagnoses and Treatments/Surgeries   Musculoskeletal: Strength & Muscle Tone: decreased Gait & Station: unable to stand Patient leans: N/A  Psychiatric Specialty Exam: Physical Exam  ROS  Blood pressure 103/67, pulse 81, temperature 97.6 F (36.4 C), temperature source Oral, resp. rate 16, height 5\' 9"  (1.753 m), weight 64.864 kg (143 lb).Body mass index is 21.11 kg/(m^2).  General Appearance: Disheveled and Guarded  Patent attorneyye Contact::  Fair  Speech:  Slow  Volume:  Decreased  Mood:  Anxious and Depressed  Affect:  Constricted and  Depressed  Thought Process:  Coherent and Goal Directed  Orientation:  Full (Time, Place, and Person)  Thought Content:  WDL  Suicidal Thoughts:  Yes.  with intent/plan  Homicidal Thoughts:  No  Memory:  Immediate;   Fair Recent;   Poor  Judgement:  Impaired  Insight:  Fair  Psychomotor Activity:  Decreased  Concentration:  Fair  Recall:  FiservFair  Fund of Knowledge:Fair  Language: Fair  Akathisia:  Negative  Handed:  Right  AIMS (if indicated):     Assets:  Communication Skills Desire for Improvement Housing Leisure Time Physical Health Resilience Social Support  Sleep:     Cognition: Impaired,  Mild  ADL's:  Intact     COGNITIVE FEATURES THAT CONTRIBUTE TO RISK:  Closed-mindedness, Loss of executive function, Polarized thinking and Thought constriction (tunnel vision)    SUICIDE RISK:   Severe:  Frequent, intense, and enduring suicidal ideation, specific plan, no subjective intent, but some objective markers of intent (i.e., choice of lethal method), the method is accessible, some limited preparatory behavior, evidence of impaired self-control, severe dysphoria/symptomatology, multiple risk factors present, and few if any protective factors, particularly a lack of social support.  PLAN OF CARE: Admit  Medical Decision Making:  Review of Psycho-Social Stressors (1), Review or order clinical lab tests (1), Established Problem, Worsening (2), Review of Last Therapy Session (1), Review or order medicine tests (1), Review of Medication Regimen & Side Effects (2) and Review of New Medication or Change in Dosage (2)  I certify that inpatient services furnished can reasonably be expected to improve the patient's condition.   Thomas [name],Thomas R. 03/19/2015, 2:43 PM

## 2015-03-20 DIAGNOSIS — F3177 Bipolar disorder, in partial remission, most recent episode mixed: Secondary | ICD-10-CM

## 2015-03-20 NOTE — Progress Notes (Signed)
Patient ID: Thomas [name], male   DOB: 06-10-1971, 44 y.o.   MRN: [id number] Adult Psychoeducational Group Note  Date:  03/20/2015 Time:  0900  Group Topic/Focus:  Making Healthy Choices:   The focus of this group is to help patients identify negative/unhealthy choices they were using prior to admission and identify positive/healthier coping strategies to replace them upon discharge.  Participation Level:  Did Not Attend  Participation Quality: n/a  Affect: n/a  Cognitive: n/a  Insight: n/a  Engagement in Group: n/a  Modes of Intervention:  Discussion, Education, Role-play and Support  Additional Comments:  Pt did not attend group. Pt was in bed asleep.   Thomas [name], Thomas [name] 03/20/2015, 9:58 AM
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
## 2015-03-20 NOTE — Progress Notes (Signed)
Patient ID: Encarnacion ChuJames E Archila, male   DOB: 12-22-1970, 44 y.o.   MRN: [id number]   D: Pt has been very flat and depressed on the unit today. Pt has also been very irritable and agitated. Pt has been very demanding and gets upset when he does not get what he wants right away. Pt reported that his goal for today was stay focused. Pt reported on his self inventory sheet that his depression was a 8, his hopelessness was a 5, and his anxiety was a 8. Pt reported being negative SI/HI, no AH/VH noted. A: 15 min checks continued for patient safety. R: Pt safety maintained.

## 2015-03-20 NOTE — Progress Notes (Signed)
Patient did attend the evening speaker AA meeting.  
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
## 2015-03-20 NOTE — [facility] Group Notes (Signed)
[facility] Group Notes:  (Clinical Social Work)  03/20/2015  10:00-11:00AM  Summary of Progress/Problems:   The main focus of today's process group was to   1)  discuss the importance of adding supports  2)  define health supports versus unhealthy supports  3)  identify the patient's current unhealthy supports and plan how to handle them  4)  Identify the patient's current healthy supports and plan what to add.  An emphasis was placed on using counselor, doctor, therapy groups, 12-step groups, and problem-specific support groups to expand supports.    The patient expressed full comprehension of the concepts presented, and agreed that there is a need to add more supports.  The patient stated his fiancee is a healthy support because she talks to him.  As the discussion proceeded about unhealthy supports, he did not seem to be interested or to pay attention.  Type of Therapy:  Process Group with Motivational Interviewing  Participation Level:  Minimal  Participation Quality:  Inattentive  Affect:  Defensive, Flat and Irritable  Cognitive:  did not participate, unable to assess  Insight:  Limited  Engagement in Therapy:  Limited  Modes of Intervention:   Education, Support and Processing, Activity  Ambrose MantleMareida Grossman-Orr, LCSW 03/20/2015

## 2015-03-21 DIAGNOSIS — F1994 Other psychoactive substance use, unspecified with psychoactive substance-induced mood disorder: Secondary | ICD-10-CM | POA: Diagnosis present

## 2015-03-21 DIAGNOSIS — F1010 Alcohol abuse, uncomplicated: Secondary | ICD-10-CM

## 2015-03-21 MED ORDER — TRAZODONE HCL 100 MG PO TABS: 100.0000 mg | ORAL_TABLET | Freq: Every day | ORAL | Status: DC

## 2015-03-21 MED ORDER — QUETIAPINE FUMARATE 100 MG PO TABS: 100.0000 mg | ORAL_TABLET | Freq: Every day | ORAL | Status: DC

## 2015-03-21 MED ORDER — HYDROXYZINE HCL 25 MG PO TABS: 25.0000 mg | ORAL_TABLET | ORAL | Status: DC | PRN

## 2015-03-21 MED ORDER — IBUPROFEN 200 MG PO TABS: 600.0000 mg | ORAL_TABLET | Freq: Three times a day (TID) | ORAL | Status: DC | PRN

## 2015-03-21 MED ORDER — NICOTINE 21 MG/24HR TD PT24: 21.0000 mg | MEDICATED_PATCH | Freq: Every day | TRANSDERMAL | Status: DC

## 2015-03-21 MED ORDER — QUETIAPINE FUMARATE 25 MG PO TABS: 25.0000 mg | ORAL_TABLET | Freq: Three times a day (TID) | ORAL | Status: DC

## 2015-03-21 NOTE — Progress Notes (Signed)
Adult Psychoeducational Group Note  Date:  03/21/2015 Time:  2000  Group Topic/Focus:  AA  Participation Level:  Active  Participation Quality:  Appropriate  Affect:  Appropriate  Cognitive:  Appropriate  Insight: Appropriate  Engagement in Group:  Engaged  Modes of Intervention:  Discussion  Additional Comments:  Pt attended AA group.   Jerimah Witucki Chanel 03/21/2015, 11:50 PM
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
## 2015-03-21 NOTE — Plan of Care (Signed)
Problem: Ineffective individual coping Goal: STG: Patient will remain free from self harm Outcome: Progressing Patient remains free from self harm. 15 minute checks continued per protocol for patient safety.   Problem: Alteration in mood Goal: LTG-Patient reports reduction in suicidal thoughts (Patient reports reduction in suicidal thoughts and is able to verbalize a safety plan for whenever patient is feeling suicidal)  Outcome: Progressing Patient denies having any suicidal thoughts today.  Problem: Diagnosis: Increased Risk For Suicide Attempt Goal: STG-Patient Will Attend All Groups On The Unit Outcome: Progressing Patient is attending unit groups today. Goal: STG-Patient Will Comply With Medication Regime Outcome: Progressing Patient has adhered to medication regimen today with ease.     

## 2015-03-21 NOTE — [facility] Group Notes (Signed)
   [facility] Group Note  03/21/2015  8:45 AM   Participation Quality: Alert, Appropriate and Oriented  Mood/Affect: Blunted  Depression Rating: 3  Anxiety Rating: 3  Thoughts of Suicide: Pt denies SI/HI  Will you contract for safety? Yes  Current AVH: Pt denies  Plan for Discharge/Comments: Pt attended discharge planning group and actively participated in group. CSW provided pt with today's workbook. Patient [place] planning to return home, [place] He plans to follow up with [facility] at discharge. He is hoping to discharge today.  Transportation Means: Pt reports access to transportation  Supports: No supports mentioned at this time  Samuella BruinKristin Damacio Weisgerber, MSW, [employer]CSWA Clinical Social Worker [facility] [phone]

## 2015-03-21 NOTE — Progress Notes (Signed)
D: Patient is alert and oriented. Pt's mood and affect is depressed and blunted. Pt denies SI/HI and AVH. Pt rates depression 3/10, hopelessness and anxiety 2/10. Pt reports his goal for the day is to "stay focused" by "think positive." Pt has several abrasions to bilateral arms and left hip, dressings clean, dry, and intact. Pt complains of left hip pain 8/10 which decreased with PRN medication. [facility] and reports he needs to return to work. Pt is attending unit groups. A: Active listening by RN. Encouragement/Support provided to pt. Dressings changed per providers orders. Medication education reviewed with pt. PRN medication administered for pain per providers orders (See MAR). Scheduled medications administered per providers orders (See MAR). 15 minute checks continued per protocol for patient safety.  R: Patient cooperative and receptive to nursing interventions. Pt remains safe.

## 2015-03-21 NOTE — [facility] Suicide Risk Assessment (Signed)
[facility] INPATIENT:  Family/Significant Other Suicide Prevention Education  Suicide Prevention Education:  Education Completed; Fiance Thomas [name] [phone],,  (name of family member/significant other) has been identified by the patient as the family member/significant other with whom the patient will be residing, and identified as the person(s) who will aid the patient in the event of a mental health crisis (suicidal ideations/suicide attempt).  With written consent from the patient, the family member/significant other has been provided the following suicide prevention education, prior to the and/or following the discharge of the patient.  The suicide prevention education provided includes the following:  Suicide risk factors  Suicide prevention and interventions  National Suicide Hotline telephone number  [facility] assessment telephone number  [facility] Emergency Assistance 911  [facility] and/or Residential Mobile Crisis Unit telephone number  Request made of family/significant other to:  Remove weapons (e.g., guns, rifles, knives), all items previously/currently identified as safety concern.    Remove drugs/medications (over-the-counter, prescriptions, illicit drugs), all items previously/currently identified as a safety concern.  The family member/significant other verbalizes understanding of the suicide prevention education information provided.  The family member/significant other agrees to remove the items of safety concern listed above.  Thomas [name], [place] CarboKristin L 03/21/2015, 1:01 PM
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
## 2015-03-21 NOTE — Progress Notes (Addendum)
Patient ID: Thomas [name], male   DOB: [birth year], 44 y.o.   MRN: [id number] D: Client visible on the unit, visits with fiancee tonight, reports "we having a little bible study right now" Client reports medication helping. A: Writer introduced self to client, provided emotional support, bandage to left wrist area changed. Medication reviewed and administered as prescribed. Staff will monitor q7515min for safety. R: Client is safe on the unit, attended group.

## 2015-03-21 NOTE — Progress Notes (Signed)
Recreation Therapy Notes  Date: 06.06.16 Time: 9:30 am Location: [address] Group Room  Group Topic: Coping Skills  Goal Area(s) Addresses:  Patient will verbalize importance of using healthy stress management. Patient will identify positive emotions associated with healthy stress management.  Intervention: Stress Management  Activity: Healthy Relaxation Guided Imagery.  LRT introduced and educated patients on stress management technique of guided imagery.  Script was used to deliver the technique to patients.  Patients were asked to follow script read aloud by LRT to engage in practicing the stress management technique.   Education: Coping Skills, Discharge Planning.   Education Outcome: Acknowledges understanding/In group clarification offered/Needs additional education.   Clinical Observations/Feedback: Patient did not attend group.    Othel Dicostanzo, LRT/CTRS         Saron Vanorman A 03/21/2015 3:26 PM 

## 2015-03-21 NOTE — [facility] Group Notes (Signed)
[facility] LCSW Group Therapy 03/21/2015  1:15 pm  Type of Therapy: Group Therapy Participation Level: Active  Participation Quality: Attentive, Sharing and Supportive  Affect: Appropriate  Cognitive: Alert and Oriented  Insight: Developing/Improving and Engaged  Engagement in Therapy: Developing/Improving and Engaged  Modes of Intervention: Clarification, Confrontation, Discussion, Education, Exploration,  Limit-setting, Orientation, Problem-solving, Rapport Building, Dance movement psychotherapisteality Testing, Socialization and Support  Summary of Progress/Problems: Pt identified obstacles faced currently and processed barriers involved in overcoming these obstacles. Pt identified steps necessary for overcoming these obstacles and explored motivation (internal and external) for facing these difficulties head on. Pt further identified one area of concern in their lives and chose a goal to focus on for today. Patient expressed difficulty opening up to family about his emotional concerns for fear of being judged. Patient reported feeling hopeful about his relationship with his fiance and "being able to think straight." CSW and other group members provided patient with emotional support and encouragement.  Samuella BruinKristin Keyli Duross, MSW, [employer]CSWA Clinical Social Worker [facility] [phone]

## 2015-03-22 MED ORDER — QUETIAPINE FUMARATE 100 MG PO TABS: 100.0000 mg | ORAL_TABLET | Freq: Every day | ORAL | Status: DC

## 2015-03-22 NOTE — [facility] Suicide Risk Assessment (Signed)
[facility] Discharge Suicide Risk Assessment   Demographic Factors:  Male  Total Time spent with patient: 30 minutes  Musculoskeletal: Strength & Muscle Tone: within normal limits Gait & Station: normal Patient leans: N/A  Psychiatric Specialty Exam: Physical Exam  Review of Systems  Constitutional: Negative.   HENT: Negative.   Eyes: Negative.   Respiratory: Negative.   Cardiovascular: Negative.   Gastrointestinal: Negative.   Genitourinary: Negative.   Musculoskeletal: Negative.   Skin: Negative.   Neurological: Negative.   Endo/Heme/Allergies: Negative.   Psychiatric/Behavioral: Positive for substance abuse.    Blood pressure 122/76, pulse 72, temperature 97.6 F (36.4 C), temperature source Oral, resp. rate 19, height '5\' 9"'  (1.753 m), weight 64.864 kg (143 lb).Body mass index is 21.11 kg/(m^2).  General Appearance: Fairly Groomed  Engineer, water::  Fair  Speech:  Clear and [id number]  Volume:  Normal  Mood:  Euthymic  Affect:  Appropriate  Thought Process:  Coherent and Goal Directed  Orientation:  Full (Time, Place, and Person)  Thought Content:  plans as he moves on, relapse prevention plan  Suicidal Thoughts:  No  Homicidal Thoughts:  No  Memory:  Immediate;   Fair Recent;   Fair Remote;   Fair  Judgement:  Fair  Insight:  Present  Psychomotor Activity:  Normal  Concentration:  Fair  Recall:  [employer] [place]  Language: Fair  Akathisia:  No  Handed:  Right  AIMS (if indicated):     Assets:  Desire for Improvement  Sleep:  Number of Hours: 6.25  Cognition: WNL  ADL's:  Intact   Have you used any form of tobacco in the last 30 days? (Cigarettes, Smokeless Tobacco, Cigars, and/or Pipes): Yes  Has this patient used any form of tobacco in the last 30 days? (Cigarettes, Smokeless Tobacco, Cigars, and/or Pipes) Yes, A prescription for an FDA-approved tobacco cessation medication was offered at discharge and the patient refused  Mental Status Per  Nursing Assessment::   On Admission:     Current Mental Status by Physician: In full contact with reality. There are no active S/S of withdrawal. There ere no active SI plans or intent states that he met with his GF and they decided to separate. States they both need space. He is going to get a room in a motel for a little while. He plans to be back working in the morning   Loss Factors: NA  Historical Factors: NA  Risk Reduction Factors:   Employed and Positive social support  Continued Clinical Symptoms:  Alcohol/Substance Abuse/Dependencies  Cognitive Features That Contribute To Risk:  Closed-mindedness, Polarized thinking and Thought constriction (tunnel vision)    Suicide Risk:  Minimal: No identifiable suicidal ideation.  Patients presenting with no risk factors but with morbid ruminations; may be classified as minimal risk based on the severity of the depressive symptoms  Principal Problem: Substance induced mood disorder Discharge Diagnoses:  Patient Active Problem List   Diagnosis Date Noted  . Substance induced mood disorder [F19.94] 03/21/2015  . Bipolar I disorder, most recent episode (or current) mixed, in partial or unspecified remission [F31.77] 03/19/2015  . Suicide attempt [T14.91]   . Mood disorder [F39] 03/12/2012  . Alcohol abuse [F10.10] 03/12/2012  . Cocaine abuse [F14.10] 03/12/2012    Follow-up Information    Follow up with [facility] On 05/02/2015.   Specialty:  Behavioral Health   Why:  Medication management appointment on Monday July 18th at 2pm with Alemu. Please go to walk-in clinic Monday-Friday  between 8 am to 3 pm sooner to be seen for a hospital medication check up. Let them know you are an established patient to be seen sooner.   Contact information:   [place] [address] [phone]       Plan Of Care/Follow-up recommendations:  Activity:  as tolerated Diet:  regular Follow up [facility] as above Is patient on multiple  antipsychotic therapies at discharge:  No   Has Patient had three or more failed trials of antipsychotic monotherapy by history:  No  Recommended Plan for Multiple Antipsychotic Therapies: NA    Hollie Wojahn A 03/22/2015, 12:01 PM

## 2015-03-22 NOTE — Tx Team (Signed)
Interdisciplinary Treatment Plan Update (Adult) Date: 03/22/2015   Time Reviewed: 9:30 AM  Progress in Treatment: Attending groups: Yes Participating in groups: Minimally Taking medication as prescribed: Yes Tolerating medication: Yes Family/Significant other contact made: Yes, CSW has spoken with fiance Patient understands diagnosis: Yes Discussing patient identified problems/goals with staff: Yes Medical problems stabilized or resolved: Yes Denies suicidal/homicidal ideation: Yes Issues/concerns per patient self-inventory: Yes Other:  New problem(s) identified: N/A  Discharge Plan or Barriers:  6/7: Patient plans to discharge to a local shelter to follow up with [facility].  Reason for Continuation of Hospitalization:  Depression Anxiety Medication Stabilization   Comments: N/A  Estimated length of stay: Discharge anticipated for today 6/7.  For review of initial/current patient goals, please see plan of care. Thomas FearingJames is a 44yo male hospitalized for a suicide attempt, jumping from a moving vehicle, with Bipolar Disorder and Alcohol use, history of cocaine and marijuana that he denies using currently. He lives with fiancee, had SI due to conflict with her. [facility], needs letter for P.O., work and [facility]. Signed 72 hr, up Tues at 4:30pm. The patient would benefit from safety monitoring, medication evaluation, psychoeducation, group therapy, and discharge planning to link with ongoing resources.   Attendees: Patient:    Family:    Physician: Dr. Jama Flavorsobos; Dr. Dub MikesLugo; Dr. Elna BreslowEappen 03/22/2015 9:30 AM  Nursing: Nadean CorwinKim Maggio, Christa PolkDobson, Dario GuardianBrittany Guthrie, Janet Webb, and ClawsonPatrice White, [place]RN 03/22/2015 9:30 AM  Clinical Social Worker: Samuella BruinKristin Dulce Martian, LCSWA 03/22/2015 9:30 AM  Other: Juline PatchQuylle Hodnett, LCSW 03/22/2015 9:30 AM  Other: Leisa LenzValerie Enoch, [facility] Liaison 03/22/2015 9:30 AM  Other: Onnie BoerJennifer Clark, Case  Manager 03/22/2015 9:30 AM  Other: Serena ColonelAggie Nwoko, NP 03/22/2015 9:30 AM  Other: Chad CordialLauren Carter, LCSWA 03/22/2015 9:30 AM  Other:                          Scribe for Treatment Team:  Samuella BruinKristin Hanh Kertesz, MSW, [employer]CSWA [phone]

## 2015-03-22 NOTE — Progress Notes (Signed)
  [facility] :  Will you be returning to the same living situation after discharge:  No. Patient plans to discharge to shelter At discharge, do you have transportation home?: Yes,  patient requesting bus pass Do you have the ability to pay for your medications: Yes,  patient will be provided with medication samples and prescriptions at discharge  Release of information consent forms completed and in the chart;  Patient's signature needed at discharge.  Patient to Follow up at: Follow-up Information    [facility].   Specialty:  Behavioral Health   Why:  Medication management appointment on Monday July 18th at 2pm with Alemu. Please go to walk-in clinic Monday-Friday between 8 am to 3 pm sooner to be seen for a hospital medication check up. Let them know you are an established patient to be seen sooner.   Contact information:   [address] [place] [address] [phone]       Patient denies SI/HI: Yes,  denies    Safety Planning and Suicide Prevention discussed: Yes,  with patient and fiance  Have you used any form of tobacco in the last 30 days? (Cigarettes, Smokeless Tobacco, Cigars, and/or Pipes): Yes  Has patient been referred to the Quitline?: Patient refused referral  Layaan Mott, [place] CarboKristin L 03/22/2015, 9:37 AM

## 2015-03-22 NOTE — Progress Notes (Signed)
DISCHARGE NOTE: D: Patient was alert, oriented, in stable condition, and ambulatory with a steady gait upon discharge. Pt denies SI/HI and AVH. A: AVS reviewed and given to pt. Follow up reviewed with pt. Resources reviewed with pt, including [facility]. Prescriptions/Medications given to pt. Belongings returned to pt. Pt given time to ask questions and express concerns. R: Pt D/C'd with bus pass and directions to nearest bus stop.

## 2015-03-22 NOTE — [facility] Group Notes (Signed)
[facility] Group Notes:  (Nursing/MHT/Case Management/Adjunct)  Date:  03/22/2015  Time:  0915am  Type of Therapy:  Nurse Education  Participation Level:  Did Not Attend  Participation Quality:  Did not attend  Affect:  Did not attend  Cognitive:  Did not attend  Insight:  None  Engagement in Group:  Did not attend  Modes of Intervention:  Discussion, Education and Support  Summary of Progress/Problems: Patient was invited to group. Pt did not attend and remained in bed resting.  Lendell CapriceGuthrie, Wayne Wicklund A 03/22/2015, 10:28 AM

## 2015-03-22 NOTE — Progress Notes (Signed)
D: Patient is alert and oriented. Pt's mood and affect is calm and blunted. Pt denies SI/HI and AVH. Pt reports he is [facility]. Pt rates depression, hopelessness, and anxiety 0/10. Pt reports his goal for the day is to "stay focus." Pt has abrasions on bilateral arms and left hip, dressings clean, dry, and intact. Pt complains of left hip pain 8/10 which decreased with PRN medication. Pt is not attending unit groups today. A: Active listening by RN. Encouragement/Support provided to pt. Dressings changed per providers orders. Medication education reviewed with pt. PRN medication administered for pain per providers orders (See MAR). Scheduled medications administered per providers orders (See MAR). 15 minute checks continued per protocol for patient safety.  R: Patient cooperative and receptive to nursing interventions. Pt remains safe.

## 2015-03-22 NOTE — Clinical Social Work Note (Signed)
Directions to [facility] shelter and bus pass/directions placed on chart.  Thomas BruinKristin Ina Poupard, MSW, [employer]CSWA Clinical Social Worker [facility] [phone]

## 2015-04-20 ENCOUNTER — Emergency Department (HOSPITAL_COMMUNITY)
Admission: EM | Admit: 2015-04-20 | Discharge: 2015-04-20 | Disposition: A | Discharge status: Home / Self Care | Payer: Federal, State, Local not specified - Other | Attending: Emergency Medicine | Admitting: Emergency Medicine

## 2015-04-20 ENCOUNTER — Encounter (HOSPITAL_COMMUNITY): Payer: Self-pay

## 2015-04-20 DIAGNOSIS — F1994 Other psychoactive substance use, unspecified with psychoactive substance-induced mood disorder: Secondary | ICD-10-CM | POA: Diagnosis present

## 2015-04-20 DIAGNOSIS — Z87828 Personal history of other (healed) physical injury and trauma: Secondary | ICD-10-CM | POA: Insufficient documentation

## 2015-04-20 DIAGNOSIS — F10129 Alcohol abuse with intoxication, unspecified: Secondary | ICD-10-CM | POA: Diagnosis present

## 2015-04-20 DIAGNOSIS — Z79899 Other long term (current) drug therapy: Secondary | ICD-10-CM | POA: Insufficient documentation

## 2015-04-20 DIAGNOSIS — F1014 Alcohol abuse with alcohol-induced mood disorder: Secondary | ICD-10-CM | POA: Insufficient documentation

## 2015-04-20 DIAGNOSIS — F3160 Bipolar disorder, current episode mixed, unspecified: Secondary | ICD-10-CM | POA: Insufficient documentation

## 2015-04-20 DIAGNOSIS — Z720 Tobacco use: Secondary | ICD-10-CM | POA: Insufficient documentation

## 2015-04-20 DIAGNOSIS — F10120 Alcohol abuse with intoxication, uncomplicated: Secondary | ICD-10-CM | POA: Insufficient documentation

## 2015-04-20 DIAGNOSIS — F12188 Cannabis abuse with other cannabis-induced disorder: Secondary | ICD-10-CM | POA: Insufficient documentation

## 2015-04-20 DIAGNOSIS — G8929 Other chronic pain: Secondary | ICD-10-CM | POA: Insufficient documentation

## 2015-04-20 LAB — COMPREHENSIVE METABOLIC PANEL
ALBUMIN: 4 g/dL (ref 3.5–5.0)
ALT: 17 U/L (ref 17–63)
ANION GAP: 12 (ref 5–15)
AST: 28 U/L (ref 15–41)
Alkaline Phosphatase: 71 U/L (ref 38–126)
BILIRUBIN TOTAL: 0.2 mg/dL — AB (ref 0.3–1.2)
BUN: 9 mg/dL (ref 6–20)
CALCIUM: 8.7 mg/dL — AB (ref 8.9–10.3)
CHLORIDE: 108 mmol/L (ref 101–111)
CO2: 21 mmol/L — ABNORMAL LOW (ref 22–32)
CREATININE: 0.86 mg/dL (ref 0.61–1.24)
GFR calc Af Amer: >60 mL/min (ref >60)
GFR calc non Af Amer: >60 mL/min (ref >60)
Glucose, Bld: 122 mg/dL — ABNORMAL HIGH (ref 65–99)
Potassium: 3.8 mmol/L (ref 3.5–5.1)
Sodium: 141 mmol/L (ref 135–145)
TOTAL PROTEIN: 7.7 g/dL (ref 6.5–8.1)

## 2015-04-20 LAB — RAPID URINE DRUG SCREEN, HOSP PERFORMED
AMPHETAMINES: NOT DETECTED
Barbiturates: NOT DETECTED
Benzodiazepines: NOT DETECTED
Cocaine: NOT DETECTED
OPIATES: NOT DETECTED
Tetrahydrocannabinol: POSITIVE — AB

## 2015-04-20 LAB — ETHANOL: ALCOHOL ETHYL (B): 290 mg/dL — AB (ref <5)

## 2015-04-20 LAB — CBC
HCT: 44.1 % (ref 39.0–52.0)
Hemoglobin: 15 g/dL (ref 13.0–17.0)
MCH: 30.7 pg (ref 26.0–34.0)
MCHC: 34 g/dL (ref 30.0–36.0)
MCV: 90.2 fL (ref 78.0–100.0)
PLATELETS: 277 10*3/uL (ref 150–400)
RBC: 4.89 MIL/uL (ref 4.22–5.81)
RDW: 13.5 % (ref 11.5–15.5)
WBC: 7.5 10*3/uL (ref 4.0–10.5)

## 2015-04-20 LAB — ACETAMINOPHEN LEVEL: Acetaminophen (Tylenol), Serum: <10 ug/mL — ABNORMAL LOW (ref 10–30)

## 2015-04-20 LAB — SALICYLATE LEVEL: Salicylate Lvl: <4 mg/dL (ref 2.8–30.0)

## 2015-04-20 MED ORDER — HYDROXYZINE HCL 25 MG PO TABS: 25.0000 mg | ORAL_TABLET | Freq: Four times a day (QID) | ORAL | Status: DC | PRN

## 2015-04-20 MED ORDER — QUETIAPINE FUMARATE 100 MG PO TABS: 100.0000 mg | ORAL_TABLET | Freq: Every day | ORAL | Status: DC

## 2015-04-20 MED ORDER — TRAZODONE HCL 100 MG PO TABS: 100.0000 mg | ORAL_TABLET | Freq: Every day | ORAL | Status: DC

## 2015-04-20 MED ORDER — LORAZEPAM 0.5 MG PO TABS
1.0000 mg | ORAL_TABLET | Freq: Three times a day (TID) | ORAL | Status: DC | PRN
  Administered 2015-04-20: 1 mg via ORAL
  Filled 2015-04-20: qty 2

## 2015-04-20 MED ORDER — IBUPROFEN 800 MG PO TABS
800.0000 mg | ORAL_TABLET | Freq: Once | ORAL | Status: AC
  Administered 2015-04-20: 800 mg via ORAL
  Filled 2015-04-20: qty 1

## 2015-04-20 MED ORDER — NICOTINE 21 MG/24HR TD PT24: 21.0000 mg | MEDICATED_PATCH | Freq: Every day | TRANSDERMAL | Status: DC
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
## 2015-04-20 NOTE — BH Assessment (Signed)
Reviewed ED notes prior to initiating assessment and spoke with Antony MaduraKelly Humes, PA-C who reports pt has hx of alcohol use and was [facility]. He is reporting vague thoughts of harming others.    Assessment to commence shortly.   Clista BernhardtNancy Safi Culotta, [facility] Triage Specialist 04/20/2015 4:20 AM
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
## 2015-04-20 NOTE — [facility] Suicide Risk Assessment (Signed)
Suicide Risk Assessment  Discharge Assessment   [facility]   Demographic Factors:  Male  Total Time spent with patient: 45 minutes   Musculoskeletal: Strength & Muscle Tone: within normal limits Gait & Station: normal Patient leans: N/A  Psychiatric Specialty Exam: Physical Exam  Review of Systems  Constitutional: Negative.   HENT: Negative.   Eyes: Negative.   Respiratory: Negative.   Cardiovascular: Negative.   Gastrointestinal: Negative.   Genitourinary: Negative.   Musculoskeletal: Negative.   Skin: Negative.   Neurological: Negative.   Endo/Heme/Allergies: Negative.   Psychiatric/Behavioral: Positive for suicidal ideas and substance abuse.    Blood pressure 110/67, pulse 83, temperature 97.5 F (36.4 C), temperature source Oral, resp. rate 18, SpO2 97 %.There is no weight on file to calculate BMI.  General Appearance: Casual  Eye Contact::  Good  Speech:  Normal Rate  Volume:  Normal  Mood:  Euthymic  Affect:  Congruent  Thought Process:  Coherent  Orientation:  Full (Time, Place, and Person)  Thought Content:  WDL  Suicidal Thoughts:  No  Homicidal Thoughts:  No  Memory:  Immediate;   Good Recent;   Good Remote;   Good  Judgement:  Fair  Insight:  Fair  Psychomotor Activity:  Normal  Concentration:  Good  Recall:  Good  Fund of Knowledge:Good  Language: Good  Akathisia:  No  Handed:  Right  AIMS (if indicated):     Assets:  Leisure Time Physical Health Resilience Social Support  ADL's:  Intact  Cognition: WNL  Sleep:          Has this patient used any form of tobacco in the last 30 days? (Cigarettes, Smokeless Tobacco, Cigars, and/or Pipes) Yes, A prescription for an FDA-approved tobacco cessation medication was offered at discharge and the patient refused  Mental Status Per Nursing Assessment::   On Admission:   Alcohol intoxication with suicidal ideations  Current Mental Status by Physician: NA  Loss  Factors: NA  Historical Factors: NA  Risk Reduction Factors:   Sense of responsibility to family, Positive social support and Positive therapeutic relationship  Continued Clinical Symptoms:  None  Cognitive Features That Contribute To Risk:  None    Suicide Risk:  Minimal: No identifiable suicidal ideation.  Patients presenting with no risk factors but with morbid ruminations; may be classified as minimal risk based on the severity of the depressive symptoms  Principal Problem: Substance induced mood disorder Discharge Diagnoses:  Patient Active Problem List   Diagnosis Date Noted  . Alcohol abuse with intoxication, uncomplicated [F10.120] 04/20/2015    Priority: High  . Substance induced mood disorder [F19.94] 03/21/2015    Priority: High  . Bipolar I disorder, most recent episode (or current) mixed, in partial or unspecified remission [F31.77] 03/19/2015  . Suicide attempt [T14.91]   . Mood disorder [F39] 03/12/2012  . Alcohol abuse [F10.10] 03/12/2012  . Cocaine abuse [F14.10] 03/12/2012      Plan Of Care/Follow-up recommendations:  Activity:  as tolerated Diet:  heart healthy diet  Is patient on multiple antipsychotic therapies at discharge:  No   Has Patient had three or more failed trials of antipsychotic monotherapy by history:  No  Recommended Plan for Multiple Antipsychotic Therapies: NA    Adalis Gatti, PMH-NP 04/20/2015, 1:56 PM

## 2015-04-20 NOTE — ED Notes (Addendum)
Pt Thomas [name] x 3, very boisterous but cooperative, presents with complaint of HI towards others.  Pt reports he is hearing voices and thinks people are talking about him when they really aren't. Denies SI or visual hallucinations.  Pt diagnosed with Bipolar DO and Mood DO.  [facility]  Pt reports he drinks alcohol on the weekends and smokes marijuana.  Pt states he has missed meds for approximately 1 week. Monitoring for safety, Q 15 min checks in effect.

## 2015-04-20 NOTE — Discharge Instructions (Signed)
For your ongoing mental health needs, you are advised to follow up with [facility].  If you do not currently have an appointment with them, new and returning patients are seen at their walk-in clinic.  Walk-in hours are Monday - Friday from 8:00 am - 3:00 pm.  Walk-in patients are seen on a first come, first served basis.  Try to arrive as early as possible for he best chance of being seen the same day:       [facility]      [address]      [place], [place] [address]      [phone]
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
## 2015-04-20 NOTE — ED Notes (Signed)
TTS in evaluating patient 

## 2015-04-20 NOTE — ED Notes (Signed)
Pt D/C as ordered. Pt alert and oriented to self, place, time and event. Denied SI, HI, AVH and pain when assessed. Vitals WNL, pt ambulatory with a steady gait, no physical distress to note at time of departure. D/C instructions reviewed with pt and understanding verbalized. List of shelters in the area given to pt and a bus pass as well. Safety maintained on Q 15 minutes checks as ordered till time of d/c from [facility].

## 2015-04-20 NOTE — ED Notes (Signed)
Pt brought in voluntarily by [employer], pt is off his medications and states that he gets aggrevated and wants to hurt people.

## 2015-08-12 ENCOUNTER — Encounter (HOSPITAL_COMMUNITY): Payer: Self-pay | Admitting: Emergency Medicine

## 2015-08-12 ENCOUNTER — Other Ambulatory Visit (HOSPITAL_COMMUNITY)
Admission: RE | Admit: 2015-08-12 | Discharge: 2015-08-12 | Disposition: A | Discharge status: Home / Self Care | Payer: Self-pay | Source: Ambulatory Visit | Attending: Family Medicine | Admitting: Family Medicine

## 2015-08-12 ENCOUNTER — Emergency Department (INDEPENDENT_AMBULATORY_CARE_PROVIDER_SITE_OTHER)
Admission: EM | Admit: 2015-08-12 | Discharge: 2015-08-12 | Disposition: A | Discharge status: Home / Self Care | Payer: Self-pay | Source: Home / Self Care | Attending: Family Medicine | Admitting: Family Medicine

## 2015-08-12 DIAGNOSIS — R369 Urethral discharge, unspecified: Secondary | ICD-10-CM

## 2015-08-12 DIAGNOSIS — Z113 Encounter for screening for infections with a predominantly sexual mode of transmission: Secondary | ICD-10-CM | POA: Insufficient documentation

## 2015-08-12 LAB — POCT URINALYSIS DIP (DEVICE)
BILIRUBIN URINE: NEGATIVE
Glucose, UA: NEGATIVE mg/dL
HGB URINE DIPSTICK: NEGATIVE
KETONES UR: NEGATIVE mg/dL
Nitrite: NEGATIVE
Protein, ur: NEGATIVE mg/dL
SPECIFIC GRAVITY, URINE: 1.025 (ref 1.005–1.030)
Urobilinogen, UA: 1 mg/dL (ref 0.0–1.0)
pH: 6.5 (ref 5.0–8.0)

## 2015-08-12 MED ORDER — AZITHROMYCIN 250 MG PO TABS
ORAL_TABLET | ORAL | Status: AC
  Filled 2015-08-12: qty 4

## 2015-08-12 MED ORDER — CEFTRIAXONE SODIUM 250 MG IJ SOLR
INTRAMUSCULAR | Status: AC
  Filled 2015-08-12: qty 250

## 2015-08-12 MED ORDER — LIDOCAINE HCL (PF) 1 % IJ SOLN
INTRAMUSCULAR | Status: AC
  Filled 2015-08-12: qty 5

## 2015-08-12 MED ORDER — CEFTRIAXONE SODIUM 250 MG IJ SOLR
250.0000 mg | Freq: Once | INTRAMUSCULAR | Status: AC
  Administered 2015-08-12: 250 mg via INTRAMUSCULAR

## 2015-08-12 MED ORDER — AZITHROMYCIN 250 MG PO TABS
1000.0000 mg | ORAL_TABLET | Freq: Once | ORAL | Status: AC
  Administered 2015-08-12: 1000 mg via ORAL
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
## 2015-08-12 NOTE — ED Notes (Signed)
Pt has been having discharge for several days.  He denies any other symptoms, but wants to be tested for STD's.

## 2015-08-12 NOTE — Discharge Instructions (Signed)
You likely have an STD. You have been treated for this but should not have any intercourse until your results come back and all sexual partners are treated.

## 2015-08-13 LAB — HIV ANTIBODY (ROUTINE TESTING W REFLEX): HIV SCREEN 4TH GENERATION: NONREACTIVE

## 2015-08-13 LAB — RPR: RPR Ser Ql: NONREACTIVE

## 2015-08-15 LAB — CYTOLOGY, (ORAL, ANAL, URETHRAL) ANCILLARY ONLY
Chlamydia: NEGATIVE
NEISSERIA GONORRHEA: NEGATIVE
TRICH (WINDOWPATH): NEGATIVE

## 2015-08-15 NOTE — ED Notes (Addendum)
HIV, RPR negative. Final report of GC, chlamydia, trich negative

## 2016-09-13 ENCOUNTER — Telehealth (HOSPITAL_COMMUNITY): Payer: Self-pay | Admitting: Emergency Medicine

## 2016-09-13 NOTE — Telephone Encounter (Signed)
Returned pt's call.... LM w/fiance (latisha Gant) Need to give lab results and to see how pt is doing from recent visit on 10/28 Notified pt in gen message that there is NO need to call back Unless not feeling any better, not tolerating meds well or if wanting to know lab results. Also let pt know labs can be obtained from MyChart

## 2017-06-15 ENCOUNTER — Encounter (HOSPITAL_COMMUNITY): Payer: Self-pay | Admitting: Emergency Medicine

## 2017-06-15 ENCOUNTER — Emergency Department (HOSPITAL_COMMUNITY)
Admission: EM | Admit: 2017-06-15 | Discharge: 2017-06-15 | Disposition: A | Discharge status: Home / Self Care | Payer: Self-pay | Attending: Emergency Medicine | Admitting: Emergency Medicine

## 2017-06-15 DIAGNOSIS — Z79899 Other long term (current) drug therapy: Secondary | ICD-10-CM | POA: Insufficient documentation

## 2017-06-15 DIAGNOSIS — Z5900 Homelessness unspecified: Secondary | ICD-10-CM

## 2017-06-15 DIAGNOSIS — F17210 Nicotine dependence, cigarettes, uncomplicated: Secondary | ICD-10-CM | POA: Insufficient documentation

## 2017-06-15 DIAGNOSIS — F1910 Other psychoactive substance abuse, uncomplicated: Secondary | ICD-10-CM | POA: Insufficient documentation

## 2017-06-15 DIAGNOSIS — F1010 Alcohol abuse, uncomplicated: Secondary | ICD-10-CM | POA: Insufficient documentation

## 2017-06-15 DIAGNOSIS — Z9114 Patient's other noncompliance with medication regimen: Secondary | ICD-10-CM | POA: Insufficient documentation

## 2017-06-15 DIAGNOSIS — Y904 Blood alcohol level of 80-99 mg/100 ml: Secondary | ICD-10-CM | POA: Insufficient documentation

## 2017-06-15 LAB — RAPID URINE DRUG SCREEN, HOSP PERFORMED
Amphetamines: NOT DETECTED
BARBITURATES: NOT DETECTED
BENZODIAZEPINES: NOT DETECTED
COCAINE: POSITIVE — AB
OPIATES: NOT DETECTED
TETRAHYDROCANNABINOL: POSITIVE — AB

## 2017-06-15 LAB — ETHANOL: Alcohol, Ethyl (B): 90 mg/dL — ABNORMAL HIGH (ref <5)

## 2017-06-15 LAB — COMPREHENSIVE METABOLIC PANEL
ALBUMIN: 3.6 g/dL (ref 3.5–5.0)
ALT: 22 U/L (ref 17–63)
ANION GAP: 13 (ref 5–15)
AST: 31 U/L (ref 15–41)
Alkaline Phosphatase: 78 U/L (ref 38–126)
BUN: 8 mg/dL (ref 6–20)
CHLORIDE: 108 mmol/L (ref 101–111)
CO2: 21 mmol/L — AB (ref 22–32)
Calcium: 8.7 mg/dL — ABNORMAL LOW (ref 8.9–10.3)
Creatinine, Ser: 1.05 mg/dL (ref 0.61–1.24)
GFR calc Af Amer: >60 mL/min (ref >60)
GFR calc non Af Amer: >60 mL/min (ref >60)
GLUCOSE: 148 mg/dL — AB (ref 65–99)
POTASSIUM: 3.7 mmol/L (ref 3.5–5.1)
Sodium: 142 mmol/L (ref 135–145)
Total Bilirubin: 0.4 mg/dL (ref 0.3–1.2)
Total Protein: 7.6 g/dL (ref 6.5–8.1)

## 2017-06-15 LAB — CBC WITH DIFFERENTIAL/PLATELET
BASOS ABS: 0 10*3/uL (ref 0.0–0.1)
BASOS PCT: 1 %
EOS PCT: 3 %
Eosinophils Absolute: 0.1 10*3/uL (ref 0.0–0.7)
HCT: 42.2 % (ref 39.0–52.0)
Hemoglobin: 14.5 g/dL (ref 13.0–17.0)
Lymphocytes Relative: 35 %
Lymphs Abs: 1.6 10*3/uL (ref 0.7–4.0)
MCH: 30.6 pg (ref 26.0–34.0)
MCHC: 34.4 g/dL (ref 30.0–36.0)
MCV: 89 fL (ref 78.0–100.0)
MONO ABS: 0.3 10*3/uL (ref 0.1–1.0)
MONOS PCT: 6 %
Neutro Abs: 2.6 10*3/uL (ref 1.7–7.7)
Neutrophils Relative %: 55 %
PLATELETS: 291 10*3/uL (ref 150–400)
RBC: 4.74 MIL/uL (ref 4.22–5.81)
RDW: 13.9 % (ref 11.5–15.5)
WBC: 4.6 10*3/uL (ref 4.0–10.5)

## 2017-06-15 MED ORDER — QUETIAPINE FUMARATE 25 MG PO TABS
25.0000 mg | ORAL_TABLET | Freq: Three times a day (TID) | ORAL | Status: DC
  Administered 2017-06-15: 25 mg via ORAL
  Filled 2017-06-15 (×3): qty 1

## 2017-06-15 MED ORDER — QUETIAPINE FUMARATE 25 MG PO TABS
25.0000 mg | ORAL_TABLET | Freq: Three times a day (TID) | ORAL | 0 refills | Status: DC
Start: 2017-06-15 — End: 2017-12-17

## 2017-06-15 NOTE — ED Notes (Signed)
Pt refused labdraw

## 2017-06-15 NOTE — ED Provider Notes (Signed)
Not in room   Thomas [name], [name], MD 06/15/17 [phone]
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
## 2017-12-11 ENCOUNTER — Emergency Department (HOSPITAL_COMMUNITY)
Admission: EM | Admit: 2017-12-11 | Discharge: 2017-12-13 | Disposition: A | Discharge status: Other Acute Inpatient Hospital | Payer: Self-pay | Attending: Emergency Medicine | Admitting: Emergency Medicine

## 2017-12-11 ENCOUNTER — Other Ambulatory Visit: Payer: Self-pay

## 2017-12-11 ENCOUNTER — Encounter (HOSPITAL_COMMUNITY): Payer: Self-pay | Admitting: *Deleted

## 2017-12-11 DIAGNOSIS — F1010 Alcohol abuse, uncomplicated: Secondary | ICD-10-CM | POA: Diagnosis present

## 2017-12-11 DIAGNOSIS — F1410 Cocaine abuse, uncomplicated: Secondary | ICD-10-CM | POA: Diagnosis present

## 2017-12-11 DIAGNOSIS — R4689 Other symptoms and signs involving appearance and behavior: Secondary | ICD-10-CM

## 2017-12-11 DIAGNOSIS — R45850 Homicidal ideations: Secondary | ICD-10-CM | POA: Insufficient documentation

## 2017-12-11 DIAGNOSIS — F314 Bipolar disorder, current episode depressed, severe, without psychotic features: Secondary | ICD-10-CM | POA: Insufficient documentation

## 2017-12-11 DIAGNOSIS — F17210 Nicotine dependence, cigarettes, uncomplicated: Secondary | ICD-10-CM | POA: Insufficient documentation

## 2017-12-11 DIAGNOSIS — F319 Bipolar disorder, unspecified: Secondary | ICD-10-CM

## 2017-12-11 LAB — ACETAMINOPHEN LEVEL

## 2017-12-11 LAB — COMPREHENSIVE METABOLIC PANEL
ALBUMIN: 4.3 g/dL (ref 3.5–5.0)
ALK PHOS: 108 U/L (ref 38–126)
ALT: 18 U/L (ref 17–63)
AST: 27 U/L (ref 15–41)
Anion gap: 13 (ref 5–15)
BUN: 10 mg/dL (ref 6–20)
CALCIUM: 8.9 mg/dL (ref 8.9–10.3)
CO2: 21 mmol/L — AB (ref 22–32)
CREATININE: 0.97 mg/dL (ref 0.61–1.24)
Chloride: 103 mmol/L (ref 101–111)
GFR calc Af Amer: >60 mL/min (ref >60)
GFR calc non Af Amer: >60 mL/min (ref >60)
GLUCOSE: 90 mg/dL (ref 65–99)
Potassium: 3.4 mmol/L — ABNORMAL LOW (ref 3.5–5.1)
Sodium: 137 mmol/L (ref 135–145)
Total Bilirubin: 0.5 mg/dL (ref 0.3–1.2)
Total Protein: 8.7 g/dL — ABNORMAL HIGH (ref 6.5–8.1)

## 2017-12-11 LAB — CBC
HEMATOCRIT: 47.1 % (ref 39.0–52.0)
HEMOGLOBIN: 15.9 g/dL (ref 13.0–17.0)
MCH: 31.4 pg (ref 26.0–34.0)
MCHC: 33.8 g/dL (ref 30.0–36.0)
MCV: 92.9 fL (ref 78.0–100.0)
Platelets: 348 10*3/uL (ref 150–400)
RBC: 5.07 MIL/uL (ref 4.22–5.81)
RDW: 14.3 % (ref 11.5–15.5)
WBC: 9.7 10*3/uL (ref 4.0–10.5)

## 2017-12-11 LAB — SALICYLATE LEVEL: Salicylate Lvl: <7 mg/dL (ref 2.8–30.0)

## 2017-12-11 LAB — ETHANOL: Alcohol, Ethyl (B): 135 mg/dL — ABNORMAL HIGH (ref <10)

## 2017-12-11 MED ORDER — LORAZEPAM 2 MG/ML IJ SOLN: 0.0000 mg | Freq: Two times a day (BID) | INTRAMUSCULAR | Status: DC

## 2017-12-11 MED ORDER — THIAMINE HCL 100 MG/ML IJ SOLN
100.0000 mg | Freq: Every day | INTRAMUSCULAR | Status: DC
  Filled 2017-12-11: qty 2

## 2017-12-11 MED ORDER — DIVALPROEX SODIUM 250 MG PO DR TAB
250.0000 mg | DELAYED_RELEASE_TABLET | Freq: Two times a day (BID) | ORAL | Status: DC
  Administered 2017-12-12 – 2017-12-13 (×3): 250 mg via ORAL
  Filled 2017-12-11 (×4): qty 1

## 2017-12-11 MED ORDER — LORAZEPAM 2 MG/ML IJ SOLN: 0.0000 mg | Freq: Four times a day (QID) | INTRAMUSCULAR | Status: AC

## 2017-12-11 MED ORDER — LORAZEPAM 1 MG PO TABS: 0.0000 mg | ORAL_TABLET | Freq: Two times a day (BID) | ORAL | Status: DC

## 2017-12-11 MED ORDER — ACETAMINOPHEN 325 MG PO TABS
650.0000 mg | ORAL_TABLET | ORAL | Status: DC | PRN
  Administered 2017-12-12 – 2017-12-13 (×3): 650 mg via ORAL
  Filled 2017-12-11 (×3): qty 2

## 2017-12-11 MED ORDER — DIVALPROEX SODIUM 250 MG PO DR TAB
500.0000 mg | DELAYED_RELEASE_TABLET | Freq: Once | ORAL | Status: AC
  Administered 2017-12-11: 500 mg via ORAL
  Filled 2017-12-11: qty 2

## 2017-12-11 MED ORDER — VITAMIN B-1 100 MG PO TABS
100.0000 mg | ORAL_TABLET | Freq: Every day | ORAL | Status: DC
  Administered 2017-12-11 – 2017-12-13 (×3): 100 mg via ORAL
  Filled 2017-12-11 (×3): qty 1

## 2017-12-11 MED ORDER — NICOTINE 21 MG/24HR TD PT24
21.0000 mg | MEDICATED_PATCH | Freq: Every day | TRANSDERMAL | Status: DC
  Administered 2017-12-11 – 2017-12-12 (×2): 21 mg via TRANSDERMAL
  Filled 2017-12-11 (×3): qty 1

## 2017-12-11 MED ORDER — LORAZEPAM 1 MG PO TABS: 0.0000 mg | ORAL_TABLET | Freq: Four times a day (QID) | ORAL | Status: AC

## 2017-12-11 NOTE — ED Triage Notes (Signed)
Pt reports being off his meds for extended amount of time. Pt reports being homicidal, denies any attempts to harm himself other than crack cocaine use yesterday.
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
## 2017-12-11 NOTE — ED Provider Notes (Signed)
Per behavioral health providers: Patient to be restarted on Depakote.  Will reassess tomorrow a.m. for possibility of discharge back to [facility] if responding to reinitiation of patient's p.o. Therapy  [facility]   Rolland PorterJames, Deijah Spikes, MD 12/11/17 1650

## 2017-12-11 NOTE — ED Notes (Signed)
Sitter arrived; briefed on pt and is now sitting at doorway with door open

## 2017-12-11 NOTE — ED Notes (Addendum)
Attempted to inventory patients belongings however during the process several small and medium size bugs began crawling out of the clothing and patient bags. Approximately 7-10 bugs that escaped the clothing / bags were humanely dispatched and all three patient bags were placed into one large plastic bag provided by [facility]. No additional items were inventoried. Charge nurse notified.

## 2017-12-11 NOTE — ED Notes (Signed)
Pt awoke while RN was in room. Pt was non-confrontational, expressed no aggressiveness. Pt only asked for his medication.

## 2017-12-11 NOTE — ED Notes (Signed)
Pt was wanded at triage, belongings removed, given food and in paper scrubs.

## 2017-12-11 NOTE — ED Notes (Signed)
Patients belongings placed in locker 3
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
## 2017-12-11 NOTE — ED Notes (Signed)
Patient's belongings placed in locker 9

## 2017-12-12 ENCOUNTER — Encounter (HOSPITAL_COMMUNITY): Payer: Self-pay | Admitting: Registered Nurse

## 2017-12-12 DIAGNOSIS — F319 Bipolar disorder, unspecified: Secondary | ICD-10-CM

## 2017-12-12 DIAGNOSIS — R4689 Other symptoms and signs involving appearance and behavior: Secondary | ICD-10-CM

## 2017-12-12 DIAGNOSIS — G4700 Insomnia, unspecified: Secondary | ICD-10-CM

## 2017-12-12 DIAGNOSIS — R45850 Homicidal ideations: Secondary | ICD-10-CM

## 2017-12-12 DIAGNOSIS — F1010 Alcohol abuse, uncomplicated: Secondary | ICD-10-CM

## 2017-12-12 DIAGNOSIS — F17210 Nicotine dependence, cigarettes, uncomplicated: Secondary | ICD-10-CM

## 2017-12-12 DIAGNOSIS — F1410 Cocaine abuse, uncomplicated: Secondary | ICD-10-CM

## 2017-12-12 NOTE — ED Triage Notes (Signed)
Meal served 

## 2017-12-12 NOTE — ED Notes (Signed)
TTS is finished.  

## 2017-12-12 NOTE — ED Notes (Signed)
Pt's dinner tray arrived. 

## 2017-12-12 NOTE — Progress Notes (Signed)
Pt. meets criteria for inpatient treatment per Assunta FoundShuvon Rankin, NP.  Referred out to the following hospitals:  [facility]  [facility]  [facility]  [facility]  [facility]  [facility]        Disposition CSW will continue to follow for placement.  Timmothy EulerJean T. Kaylyn LimSutter, MSW, LCSWA Disposition Clinical Social Work [phone] (cell) [phone] (office)

## 2017-12-12 NOTE — ED Notes (Signed)
TTS being completed at this time. ?

## 2017-12-12 NOTE — ED Notes (Signed)
Dinner Tray ordered 

## 2017-12-12 NOTE — ED Triage Notes (Signed)
Pt walked to desk to make a personal phone call.

## 2017-12-12 NOTE — ED Notes (Signed)
Ate breakfast and asked for and got extra graham crackers
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
## 2017-12-13 ENCOUNTER — Inpatient Hospital Stay (HOSPITAL_COMMUNITY)
Admission: AD | Admit: 2017-12-13 | Discharge: 2017-12-17 | DRG: 885 | Disposition: A | Discharge status: Home / Self Care | Payer: Federal, State, Local not specified - Other | Source: Intra-hospital | Attending: Psychiatry | Admitting: Psychiatry

## 2017-12-13 ENCOUNTER — Other Ambulatory Visit: Payer: Self-pay

## 2017-12-13 ENCOUNTER — Encounter (HOSPITAL_COMMUNITY): Payer: Self-pay | Admitting: *Deleted

## 2017-12-13 DIAGNOSIS — F322 Major depressive disorder, single episode, severe without psychotic features: Secondary | ICD-10-CM | POA: Diagnosis not present

## 2017-12-13 DIAGNOSIS — F1020 Alcohol dependence, uncomplicated: Secondary | ICD-10-CM | POA: Diagnosis present

## 2017-12-13 DIAGNOSIS — G4700 Insomnia, unspecified: Secondary | ICD-10-CM | POA: Diagnosis not present

## 2017-12-13 DIAGNOSIS — F419 Anxiety disorder, unspecified: Secondary | ICD-10-CM | POA: Diagnosis not present

## 2017-12-13 DIAGNOSIS — F314 Bipolar disorder, current episode depressed, severe, without psychotic features: Secondary | ICD-10-CM | POA: Diagnosis not present

## 2017-12-13 DIAGNOSIS — Z590 Homelessness: Secondary | ICD-10-CM

## 2017-12-13 DIAGNOSIS — F17210 Nicotine dependence, cigarettes, uncomplicated: Secondary | ICD-10-CM | POA: Diagnosis present

## 2017-12-13 DIAGNOSIS — F1994 Other psychoactive substance use, unspecified with psychoactive substance-induced mood disorder: Secondary | ICD-10-CM | POA: Diagnosis present

## 2017-12-13 DIAGNOSIS — R456 Violent behavior: Secondary | ICD-10-CM | POA: Diagnosis not present

## 2017-12-13 DIAGNOSIS — F1099 Alcohol use, unspecified with unspecified alcohol-induced disorder: Secondary | ICD-10-CM | POA: Diagnosis not present

## 2017-12-13 DIAGNOSIS — R45850 Homicidal ideations: Secondary | ICD-10-CM | POA: Diagnosis present

## 2017-12-13 DIAGNOSIS — Z79899 Other long term (current) drug therapy: Secondary | ICD-10-CM

## 2017-12-13 DIAGNOSIS — Y906 Blood alcohol level of 120-199 mg/100 ml: Secondary | ICD-10-CM | POA: Diagnosis present

## 2017-12-13 DIAGNOSIS — F1290 Cannabis use, unspecified, uncomplicated: Secondary | ICD-10-CM | POA: Diagnosis not present

## 2017-12-13 DIAGNOSIS — Z560 Unemployment, unspecified: Secondary | ICD-10-CM

## 2017-12-13 DIAGNOSIS — F3160 Bipolar disorder, current episode mixed, unspecified: Secondary | ICD-10-CM | POA: Diagnosis present

## 2017-12-13 DIAGNOSIS — Z23 Encounter for immunization: Secondary | ICD-10-CM

## 2017-12-13 LAB — RAPID URINE DRUG SCREEN, HOSP PERFORMED
Amphetamines: NOT DETECTED
BARBITURATES: NOT DETECTED
BENZODIAZEPINES: NOT DETECTED
COCAINE: POSITIVE — AB
OPIATES: NOT DETECTED
Tetrahydrocannabinol: POSITIVE — AB

## 2017-12-13 MED ORDER — ONDANSETRON 4 MG PO TBDP: 4.0000 mg | ORAL_TABLET | Freq: Four times a day (QID) | ORAL | Status: AC | PRN

## 2017-12-13 MED ORDER — CHLORDIAZEPOXIDE HCL 25 MG PO CAPS
25.0000 mg | ORAL_CAPSULE | Freq: Three times a day (TID) | ORAL | Status: AC
  Administered 2017-12-15 – 2017-12-16 (×3): 25 mg via ORAL
  Filled 2017-12-13 (×3): qty 1

## 2017-12-13 MED ORDER — CHLORDIAZEPOXIDE HCL 25 MG PO CAPS
25.0000 mg | ORAL_CAPSULE | ORAL | Status: AC
  Administered 2017-12-16 – 2017-12-17 (×2): 25 mg via ORAL
  Filled 2017-12-13 (×2): qty 1

## 2017-12-13 MED ORDER — VITAMIN B-1 100 MG PO TABS
100.0000 mg | ORAL_TABLET | Freq: Every day | ORAL | Status: DC
  Administered 2017-12-14 – 2017-12-17 (×4): 100 mg via ORAL
  Filled 2017-12-13 (×5): qty 1

## 2017-12-13 MED ORDER — CHLORDIAZEPOXIDE HCL 25 MG PO CAPS: 25.0000 mg | ORAL_CAPSULE | Freq: Every day | ORAL | Status: DC

## 2017-12-13 MED ORDER — LOPERAMIDE HCL 2 MG PO CAPS: 2.0000 mg | ORAL_CAPSULE | ORAL | Status: AC | PRN

## 2017-12-13 MED ORDER — CHLORDIAZEPOXIDE HCL 25 MG PO CAPS
25.0000 mg | ORAL_CAPSULE | Freq: Four times a day (QID) | ORAL | Status: AC
  Administered 2017-12-13 – 2017-12-15 (×6): 25 mg via ORAL
  Filled 2017-12-13 (×6): qty 1

## 2017-12-13 MED ORDER — INFLUENZA VAC SPLIT QUAD 0.5 ML IM SUSY
0.5000 mL | PREFILLED_SYRINGE | INTRAMUSCULAR | Status: AC
  Administered 2017-12-14: 0.5 mL via INTRAMUSCULAR
  Filled 2017-12-13: qty 0.5

## 2017-12-13 MED ORDER — DIVALPROEX SODIUM 250 MG PO DR TAB
250.0000 mg | DELAYED_RELEASE_TABLET | Freq: Two times a day (BID) | ORAL | Status: DC
  Administered 2017-12-13 – 2017-12-17 (×8): 250 mg via ORAL
  Filled 2017-12-13 (×5): qty 1
  Filled 2017-12-13: qty 14
  Filled 2017-12-13 (×2): qty 1
  Filled 2017-12-13: qty 14
  Filled 2017-12-13 (×4): qty 1

## 2017-12-13 MED ORDER — ALUM & MAG HYDROXIDE-SIMETH 200-200-20 MG/5ML PO SUSP: 30.0000 mL | ORAL | Status: DC | PRN

## 2017-12-13 MED ORDER — PNEUMOCOCCAL VAC POLYVALENT 25 MCG/0.5ML IJ INJ
0.5000 mL | INJECTION | INTRAMUSCULAR | Status: AC
  Administered 2017-12-14: 0.5 mL via INTRAMUSCULAR

## 2017-12-13 MED ORDER — HYDROXYZINE HCL 25 MG PO TABS
25.0000 mg | ORAL_TABLET | Freq: Four times a day (QID) | ORAL | Status: AC | PRN
  Administered 2017-12-13 – 2017-12-15 (×3): 25 mg via ORAL
  Filled 2017-12-13 (×3): qty 1

## 2017-12-13 MED ORDER — ACETAMINOPHEN 325 MG PO TABS
650.0000 mg | ORAL_TABLET | Freq: Four times a day (QID) | ORAL | Status: DC | PRN
  Administered 2017-12-16: 650 mg via ORAL
  Filled 2017-12-13: qty 2

## 2017-12-13 MED ORDER — CHLORDIAZEPOXIDE HCL 25 MG PO CAPS
25.0000 mg | ORAL_CAPSULE | Freq: Four times a day (QID) | ORAL | Status: AC | PRN
  Administered 2017-12-15: 25 mg via ORAL
  Filled 2017-12-13: qty 1

## 2017-12-13 MED ORDER — MAGNESIUM HYDROXIDE 400 MG/5ML PO SUSP: 30.0000 mL | Freq: Every day | ORAL | Status: DC | PRN

## 2017-12-13 NOTE — Progress Notes (Signed)
[facility]  Thomas Rankin, NP is the accepting provider.  Dr.Cobos is the attending provider.  Call report to [phone]  @ [email or web address] ED notified.   Pt is Voluntary.  Pt may be transported by [facility]  Pt scheduled  [facility] as soon as transport can arranged.  Thomas EulerJean T. Kaylyn LimSutter, MSW, LCSWA Disposition Clinical Social Work [phone] (cell) [phone] (office)

## 2017-12-13 NOTE — Progress Notes (Signed)
Thomas [name] is a 47 year old male pt admitted on voluntary basis. On admission, he reports feelings of irritability and reports that he has not had any of his medications for the past 8 months. He reports feelings of depression as well but denies any SI on admission but is able to contract for safety while in the hospital. He reports that he has been self-medicating with alcohol and drugs. He reports that he is homeless and unsure where he will go once he is discharged. He reports that he feels that he needs to get back on his medications while he is here. Thomas [name] was oriented to the unit and safety maintained.

## 2017-12-13 NOTE — ED Notes (Signed)
Pt just completed repeat TTS. Plan to seek placement.

## 2017-12-13 NOTE — ED Notes (Signed)
[facility]

## 2017-12-13 NOTE — ED Notes (Signed)
Regular Diet ordered for Lunch. 

## 2017-12-13 NOTE — Progress Notes (Signed)
The patient attended the evening Wrap-Up group and was appropriate. Patient states that he is pleased with the fact that he is taking his medication once again. His goal for tomorrow is to simply wake up and enjoy his day.

## 2017-12-13 NOTE — ED Triage Notes (Signed)
[facility] transport called  

## 2017-12-13 NOTE — ED Notes (Signed)
Regular Diet had been ordered for Dinner.

## 2017-12-13 NOTE — ED Notes (Signed)
Pt states he doesn't wish to have a snack. Pt currently doing TTS.

## 2017-12-13 NOTE — [facility] Counselor (Signed)
Re-assessment:   Patient seen TTS writer and Assunta FoundShuvon Rankin, NP  Patient report he has a headache and has been having a headache the past few days.   Patient denies suicidal homicidal. Patient report continues to endorse wanting to hurt others, easily irritated, mood swings and impulsive violent behaviors to hit others.   Per Shuvon Rankin, NP, patient continues to be recommended for inpt. tx

## 2017-12-13 NOTE — Tx Team (Signed)
Initial Treatment Plan 12/13/2017 6:37 PM Thomas ChuJames E [name] ZOX:[id number]    PATIENT STRESSORS: Financial difficulties Medication change or noncompliance Substance abuse   PATIENT STRENGTHS: Ability for insight Average or above average intelligence Capable of independent living General fund of knowledge Motivation for treatment/growth   PATIENT IDENTIFIED PROBLEMS: Depression Substance Abuse Suicidal thoughts "I need to get back on my medications"                     DISCHARGE CRITERIA:  Ability to meet basic life and health needs Improved stabilization in mood, thinking, and/or behavior Reduction of life-threatening or endangering symptoms to within safe limits Verbal commitment to aftercare and medication compliance  PRELIMINARY DISCHARGE PLAN: Attend aftercare/continuing care group  PATIENT/FAMILY INVOLVEMENT: This treatment plan has been presented to and reviewed with the patient, Thomas [name], and/or family member, .  The patient and family have been given the opportunity to ask questions and make suggestions.  Thomas [name], Thomas [name], [place]RN 12/13/2017, 6:37 PM

## 2017-12-14 ENCOUNTER — Encounter (HOSPITAL_COMMUNITY): Payer: Self-pay | Admitting: Psychiatry

## 2017-12-14 DIAGNOSIS — F322 Major depressive disorder, single episode, severe without psychotic features: Secondary | ICD-10-CM

## 2017-12-14 DIAGNOSIS — F1099 Alcohol use, unspecified with unspecified alcohol-induced disorder: Secondary | ICD-10-CM

## 2017-12-14 DIAGNOSIS — R456 Violent behavior: Secondary | ICD-10-CM

## 2017-12-14 DIAGNOSIS — F1490 Cocaine use, unspecified, uncomplicated: Secondary | ICD-10-CM

## 2017-12-14 DIAGNOSIS — F1994 Other psychoactive substance use, unspecified with psychoactive substance-induced mood disorder: Secondary | ICD-10-CM

## 2017-12-14 DIAGNOSIS — Y906 Blood alcohol level of 120-199 mg/100 ml: Secondary | ICD-10-CM

## 2017-12-14 DIAGNOSIS — F1290 Cannabis use, unspecified, uncomplicated: Secondary | ICD-10-CM

## 2017-12-14 DIAGNOSIS — F419 Anxiety disorder, unspecified: Secondary | ICD-10-CM

## 2017-12-14 DIAGNOSIS — F17210 Nicotine dependence, cigarettes, uncomplicated: Secondary | ICD-10-CM

## 2017-12-14 MED ORDER — RISPERIDONE 0.5 MG PO TABS
0.5000 mg | ORAL_TABLET | Freq: Three times a day (TID) | ORAL | Status: DC | PRN
  Administered 2017-12-14 – 2017-12-15 (×2): 0.5 mg via ORAL
  Filled 2017-12-14 (×2): qty 1

## 2017-12-14 NOTE — Progress Notes (Signed)
Pt is glad he was able to come into the hospital to get back on his medications.  He has been in the dayroom talking with peers, and has been pleasant and cooperative with staff.  He asked about his meds and what he would be taking tonight.  Writer reviewed the meds he had been ordered so far, and encouraged him to discuss any concerns with the doctor in the morning.  Pt was agreeable and was satisfied with his meds tonight.  He denies SI/HI/AVH at this time.  He makes his needs known to staff.  Support and encouragement offered.  Discharge plans are in process.  Safety maintained with q15 minute checks.
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
## 2017-12-14 NOTE — [facility] Group Notes (Signed)
[facility] Group Notes:  (Nursing/MHT/Case Management/Adjunct)  Date:  12/14/2017  Time:  3:21 PM  Type of Therapy:  Nurse Education  Participation Level:  Active  Participation Quality:  Appropriate  Affect:  Appropriate  Cognitive:  Appropriate  Insight:  Appropriate  Engagement in Group:  Engaged  Modes of Intervention:  Activity  Summary of Progress/Problems:  Nurse introduced a game, called the "Ungame",  used as a form to allowing the patient s a forum to discuss feelings, talk about values and relate experiences, that are not too in depth.  Thomas [name] G [name] 12/14/2017, 3:21 PM

## 2017-12-14 NOTE — [facility] Group Notes (Signed)
LCSW Group Therapy Note  12/14/2017 10:00AM - 11:15 - [address], 11:15-12:00 - [address]  Type of Therapy and Topic:  Group Therapy: Anger Cues and Responses  Participation Level:  Did Not Attend   Description of Group:   In this group, patients learned how to recognize the physical, cognitive, emotional, and behavioral responses they have to anger-provoking situations.  They identified a recent time they became angry and how they reacted.  They analyzed how their reaction was possibly beneficial and how it was possibly unhelpful.  The group discussed a variety of healthier coping skills that could help with such a situation in the future.  Deep breathing was practiced briefly.  Therapeutic Goals: 1. Patients will remember their last incident of anger and how they felt emotionally and physically, what their thoughts were at the time, and how they behaved. 2. Patients will identify how their behavior at that time worked for them, as well as how it worked against them. 3. Patients will explore possible new behaviors to use in future anger situations. 4. Patients will learn that anger itself is normal and cannot be eliminated, and that healthier reactions can assist with resolving conflict rather than worsening situations.  Summary of Patient Progress:  N/A  Therapeutic Modalities:   Cognitive Behavioral Therapy  Lynnell ChadMareida J Grossman-Orr  12/14/2017 8:10 AM

## 2017-12-14 NOTE — B[facility] Suicide Risk Assessment (Signed)
[facility] INPATIENT:  Family/Significant Other Suicide Prevention Education  Suicide Prevention Education:  Patient Refusal for Family/Significant Other Suicide Prevention Education: The patient Thomas [name] has refused to provide written consent for family/significant other to be provided Family/Significant Other Suicide Prevention Education during admission and/or prior to discharge.  Physician notified.  Patient not suicidal at admission, refuses consent and refuses the information himself, stating he would hurt someone else before he would hurt himself.  Carloyn JaegerMareida J Grossman-Orr 12/14/2017, 3:15 PM
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
## 2017-12-14 NOTE — Plan of Care (Signed)
Patient has attended most of the units activities this shift. Patient slept late, but has remained awake, since about 10 am. Patient has taken his medications as prescribed.
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
## 2017-12-15 MED ORDER — ONDANSETRON HCL 4 MG PO TABS
4.0000 mg | ORAL_TABLET | Freq: Once | ORAL | Status: AC
  Administered 2017-12-15: 4 mg via ORAL
  Filled 2017-12-15 (×2): qty 1

## 2017-12-15 NOTE — Progress Notes (Signed)
Patient did not attend the evening speaker AA meeting. Pt was notified that group was beginning but pt returned to his room.

## 2017-12-15 NOTE — [facility] Group Notes (Signed)
Pt was invited but did not attend orientation/goals group. 

## 2017-12-15 NOTE — Progress Notes (Addendum)
D. Pt observed resting in bed upon initial approach. Pt reports having not slept well during the night- and remained in bed for much of the morning. Per pt's self inventory, pt rates his depression, hopelessness and anxiety an 8/7/5, respectively. Pt writes that his most important goal today is "to listen to others" and "help others".  Pt currently denies SI/HI and AVH A. Labs and vitals monitored. Pt compliant with medications. Pt supported emotionally and encouraged to express concerns and ask questions.   R. Pt remains safe with 15 minute checks. Will continue POC.
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
## 2017-12-15 NOTE — [facility] Group Notes (Signed)
[facility] LCSW Group Therapy Note  Date/Time:  12/15/2017 10:00-11:00AM  Type of Therapy and Topic:  Group Therapy:  Healthy and Unhealthy Supports  Participation Level:  Did Not Attend   Description of Group:  Patients in this group were introduced to the idea of adding a variety of healthy supports to address the various needs in their lives.Patients discussed what additional healthy supports could be helpful in their recovery and wellness after discharge in order to prevent future hospitalizations.   An emphasis was placed on using counselor, doctor, therapy groups, 12-step groups, and problem-specific support groups to expand supports.  They also worked as a group on developing a specific plan for several patients to deal with unhealthy supports through boundary-setting, psychoeducation with loved ones, and even termination of relationships.   Therapeutic Goals:   1)  discuss importance of adding supports to stay well once out of the hospital  2)  compare healthy versus unhealthy supports and identify some examples of each  3)  generate ideas and descriptions of healthy supports that can be added  4)  offer mutual support about how to address unhealthy supports  5)  encourage active participation in and adherence to discharge plan    Summary of Patient Progress:  N/A   Therapeutic Modalities:   Motivational Interviewing Brief Solution-Focused Therapy  Ewing Fandino Grossman-Orr, LCSW        

## 2017-12-15 NOTE — Progress Notes (Addendum)
D.  Pt anxious on approach, states he wishes to be discharged tomorrow.  Pt states his plan is to go to [facility] from here and would like for them to be notified that he is coming and be expecting him.   Pt does not understand why he has not been placed back on his Seroquel.  Pt states "I just want to get my medications right.   Pt was positive for evening AA group, has been interacting minimally but appropriately with peers on the unit today.  Pt denies SI/HI/AVH at this time.  A.  Support and encouragement offered, Pt given 72 hour request for discharge at his request to sign.  Medications given as ordered.  R.  Pt remains safe on the unit, will continue to monitor.    [id number]   12/16/17    Pt asked to rescind 72 hour request for discharge and signed the appropriate paper.  Pt states that he "thought on it last night" and decided to stay and "do what's right for me".

## 2017-12-15 NOTE — [facility] Group Notes (Signed)
[facility] Date:  12/15/2017  Time:  3:33 PM  Type of Therapy:  Pt did not attend.  Participation Level:  Did Not Attend  Participation Quality:    Affect:    Cognitive:    Insight:    Engagement in Group:    Modes of Intervention:    Summary of Progress/Problems:  Rich BraveDuke, Ursala Cressy Lynn 12/15/2017, 3:33 PM

## 2017-12-16 MED ORDER — QUETIAPINE FUMARATE 50 MG PO TABS
50.0000 mg | ORAL_TABLET | Freq: Every day | ORAL | Status: DC
  Administered 2017-12-16: 50 mg via ORAL
  Filled 2017-12-16: qty 7
  Filled 2017-12-16 (×2): qty 1

## 2017-12-16 NOTE — Progress Notes (Signed)
Per pt request, [facility]. Pt reports that he has court date this morning. Pt provided with fax confirmation and originals for his record and was encouraged to follow-up with the [place] today.  Trula SladeHeather Smart, MSW, LCSW Clinical Social Worker 12/16/2017 8:16 AM

## 2017-12-16 NOTE — Progress Notes (Signed)
The patient attended the vast majority of the evening A.A.meeting. He left a few minutes and stated that the guest speaker was rude.

## 2017-12-16 NOTE — Progress Notes (Signed)
DAR NOTE: Pt present with flat affect and depressed mood in the unit. Pt has been isolating himself, pt also endorses some confusion and drownsness. Pt denies physical pain, took all his meds as scheduled. As per self inventory, pt had a fair night sleep, fair appetite, normal energy, and poor concentration. Pt rate depression at 8, hopeless ness at 8, and anxiety at 10. Pt's safety ensured with 15 minute and environmental checks. Pt currently denies SI/HI and A/V hallucinations. Pt verbally agrees to seek staff if SI/HI or A/VH occurs and to consult with staff before acting on these thoughts. Will continue POC.
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
## 2017-12-16 NOTE — Progress Notes (Signed)
Recreation Therapy Notes  Date: 12/16/17 Time: 0930 Location: 300 [address] Dayroom  Group Topic: Stress Management  Goal Area(s) Addresses:  Patient will verbalize importance of using healthy stress management.  Patient will identify positive emotions associated with healthy stress management.   Intervention: Stress Management  Activity :  Guided Imagery.  LRT introduced the stress management technique of guided imagery.  LRT read a script that allowed patients to envision being on the beach.  Patients were to follow along as the script was read to engage in the activity.  Education:  Stress Management, Discharge Planning.   Education Outcome: Acknowledges edcuation/In group clarification offered/Needs additional education  Clinical Observations/Feedback: Pt did not attend group.     Caroll RancherMarjette Celestine Bougie, LRT/CTRS         Lillia AbedLindsay, Howard Bunte A 12/16/2017 11:01 AM

## 2017-12-16 NOTE — Tx Team (Signed)
Interdisciplinary Treatment and Diagnostic Plan Update  12/16/2017 Time of Session: 0830AM Thomas [name] MRN: [id number]  Principal Diagnosis: Substance induced mood disorder (HCC)  Secondary Diagnoses: Principal Problem:   Substance induced mood disorder (HCC) Active Problems:   MDD (major depressive disorder), severe (HCC)   Current Medications:  Current Facility-Administered Medications  Medication Dose Route Frequency Provider Last Rate Last Dose  . acetaminophen (TYLENOL) tablet 650 mg  650 mg Oral Q6H PRN Rankin, Shuvon B, NP      . alum & mag hydroxide-simeth (MAALOX/MYLANTA) 200-200-20 MG/5ML suspension 30 mL  30 mL Oral Q4H PRN Rankin, Shuvon B, NP      . chlordiazePOXIDE (LIBRIUM) capsule 25 mg  25 mg Oral Q6H PRN Nira Conn A, NP   25 mg at 12/15/17 2143  . chlordiazePOXIDE (LIBRIUM) capsule 25 mg  25 mg Oral BH-qamhs Jackelyn Poling, NP       Followed by  . [START ON 12/18/2017] chlordiazePOXIDE (LIBRIUM) capsule 25 mg  25 mg Oral Daily Nira Conn A, NP      . divalproex (DEPAKOTE) DR tablet 250 mg  250 mg Oral Q12H Nira Conn A, NP   250 mg at 12/16/17 8469  . hydrOXYzine (ATARAX/VISTARIL) tablet 25 mg  25 mg Oral Q6H PRN Nira Conn A, NP   25 mg at 12/15/17 2142  . loperamide (IMODIUM) capsule 2-4 mg  2-4 mg Oral PRN Nira Conn A, NP      . magnesium hydroxide (MILK OF MAGNESIA) suspension 30 mL  30 mL Oral Daily PRN Rankin, Shuvon B, NP      . ondansetron (ZOFRAN-ODT) disintegrating tablet 4 mg  4 mg Oral Q6H PRN Nira Conn A, NP      . risperiDONE (RISPERDAL) tablet 0.5 mg  0.5 mg Oral TID PRN Georgiann Cocker, MD   0.5 mg at 12/15/17 2142  . thiamine (VITAMIN B-1) tablet 100 mg  100 mg Oral Daily Nira Conn A, NP   100 mg at 12/16/17 6295   PTA Medications: Medications Prior to Admission  Medication Sig Dispense Refill Last Dose  . hydrOXYzine (ATARAX/VISTARIL) 25 MG tablet Take 1 tablet (25 mg total) by mouth every 4 (four) hours as needed for anxiety  (Sleep). (Patient not taking: Reported on 12/11/2017) 30 tablet 0 Not Taking at Unknown time  . ibuprofen (ADVIL,MOTRIN) 200 MG tablet Take 3 tablets (600 mg total) by mouth every 8 (eight) hours as needed. For fever, pain (Patient not taking: Reported on 12/11/2017) 30 tablet 0 Not Taking at Unknown time  . nicotine (NICODERM CQ - DOSED IN MG/24 HOURS) 21 mg/24hr patch Place 1 patch (21 mg total) onto the skin daily. For nicotine addiction (Patient not taking: Reported on 04/20/2015) 28 patch 0 Unknown at Unknown time  . QUEtiapine (SEROQUEL) 25 MG tablet Take 1 tablet (25 mg total) by mouth 3 (three) times daily. (Patient not taking: Reported on 12/11/2017) 90 tablet 0 Not Taking at Unknown time    Patient Stressors: Financial difficulties Medication change or noncompliance Substance abuse  Patient Strengths: Ability for insight Average or above average intelligence Capable of independent living [employer] of knowledge Motivation for treatment/growth  Treatment Modalities: Medication Management, Group therapy, Case management,  1 to 1 session with clinician, Psychoeducation, Recreational therapy.   Physician Treatment Plan for Primary Diagnosis: Substance induced mood disorder (HCC) Long Term Goal(s): Improvement in symptoms so as ready for discharge Improvement in symptoms so as ready for discharge   Short Term Goals:  Ability to identify changes in lifestyle to reduce recurrence of condition will improve Ability to verbalize feelings will improve Ability to demonstrate self-control will improve Ability to identify and develop effective coping behaviors will improve Ability to identify and develop effective coping behaviors will improve Ability to maintain clinical measurements within normal limits will improve Compliance with prescribed medications will improve Ability to identify triggers associated with substance abuse/mental health issues will improve  Medication Management:  Evaluate patient's response, side effects, and tolerance of medication regimen.  Therapeutic Interventions: 1 to 1 sessions, Unit Group sessions and Medication administration.  Evaluation of Outcomes: Progressing  Physician Treatment Plan for Secondary Diagnosis: Principal Problem:   Substance induced mood disorder (HCC) Active Problems:   MDD (major depressive disorder), severe (HCC)  Long Term Goal(s): Improvement in symptoms so as ready for discharge Improvement in symptoms so as ready for discharge   Short Term Goals: Ability to identify changes in lifestyle to reduce recurrence of condition will improve Ability to verbalize feelings will improve Ability to demonstrate self-control will improve Ability to identify and develop effective coping behaviors will improve Ability to identify and develop effective coping behaviors will improve Ability to maintain clinical measurements within normal limits will improve Compliance with prescribed medications will improve Ability to identify triggers associated with substance abuse/mental health issues will improve     Medication Management: Evaluate patient's response, side effects, and tolerance of medication regimen.  Therapeutic Interventions: 1 to 1 sessions, Unit Group sessions and Medication administration.  Evaluation of Outcomes: Progressing   RN Treatment Plan for Primary Diagnosis: Substance induced mood disorder (HCC) Long Term Goal(s): Knowledge of disease and therapeutic regimen to maintain health will improve  Short Term Goals: Ability to remain free from injury will improve, Ability to disclose and discuss suicidal ideas and Ability to identify and develop effective coping behaviors will improve  Medication Management: RN will administer medications as ordered by provider, will assess and evaluate patient's response and provide education to patient for prescribed medication. RN will report any adverse and/or side effects  to prescribing provider.  Therapeutic Interventions: 1 on 1 counseling sessions, Psychoeducation, Medication administration, Evaluate responses to treatment, Monitor vital signs and CBGs as ordered, Perform/monitor CIWA, COWS, AIMS and Fall Risk screenings as ordered, Perform wound care treatments as ordered.  Evaluation of Outcomes: Progressing   LCSW Treatment Plan for Primary Diagnosis: Substance induced mood disorder (HCC) Long Term Goal(s): Safe transition to appropriate next level of care at discharge, Engage patient in therapeutic group addressing interpersonal concerns.  Short Term Goals: Engage patient in aftercare planning with referrals and resources, Facilitate patient progression through stages of change regarding substance use diagnoses and concerns and Identify triggers associated with mental health/substance abuse issues  Therapeutic Interventions: Assess for all discharge needs, 1 to 1 time with Social worker, Explore available resources and support systems, Assess for adequacy in community support network, Educate family and significant other(s) on suicide prevention, Complete Psychosocial Assessment, Interpersonal group therapy.  Evaluation of Outcomes: Progressing   Progress in Treatment: Attending groups: Yes. Participating in groups: Yes. Taking medication as prescribed: Yes. Toleration medication: Yes. Family/Significant other contact made: SPE completed with pt; pt declined to consent to collateral contact.  Patient understands diagnosis: Yes. Discussing patient identified problems/goals with staff: Yes. Medical problems stabilized or resolved: Yes. Denies suicidal/homicidal ideation: Yes. Issues/concerns per patient self-inventory: Yes. Other: n/a   New problem(s) identified: No, Describe:  n/a  New Short Term/Long Term Goal(s): detox, medication management for mood  stabilization; elimination of SI thoughts; development of comprehensive mental  wellness/sobriety plan.   Patient Goal: "to get back on my meds."   Discharge Plan or Barriers: Pt would like to follow-up at [facility] and is not interested in residential treatment. Pt is homeless and has been given IRC/Shelter information/[place] house list/[facility] [employer]. [facility] pamphlet and AA/NA list for [facility] also provided for additional community support.   Reason for Continuation of Hospitalization: Anxiety Depression Medication stabilization Suicidal ideation Withdrawal symptoms  Estimated Length of Stay: Tuesday, 12/17/17  Attendees: Patient: Thomas [name] 12/16/2017 8:34 AM  Physician: Dr. Altamese Fern Park MD; Dr. Jackquline Berlin MD 12/16/2017 8:34 AM  Nursing: Erskine Squibb RN; Armando Reichert RN 12/16/2017 8:34 AM  RN Care Manager:x 12/16/2017 8:34 AM  Social Worker: Chartered loss adjuster, LCSW 12/16/2017 8:34 AM  Recreational Therapist: x 12/16/2017 8:34 AM  Other: Reola Calkins NP; Hillery Jacks NP 12/16/2017 8:34 AM  Other:  12/16/2017 8:34 AM  Other: 12/16/2017 8:34 AM    Scribe for Treatment Team: Ledell Peoples Smart, LCSW 12/16/2017 8:34 AM

## 2017-12-16 NOTE — Plan of Care (Signed)
  Activity: Sleeping patterns will improve 12/16/2017 1132 - Progressing by Bethann Puncheschieng, Miah Boye O, RN   Progressing Activity: Sleeping patterns will improve 12/16/2017 1132 - Progressing by Bethann Puncheschieng, Mayrin Schmuck O, RN Coping: Ability to verbalize frustrations and anger appropriately will improve 12/16/2017 1132 - Progressing by Bethann Puncheschieng, Laelyn Blumenthal O, RN Safety: Periods of time without injury will increase 12/16/2017 1132 - Progressing by Bethann Puncheschieng, Hartley Wyke O, RN
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
## 2017-12-16 NOTE — Progress Notes (Signed)
  DATA ACTION RESPONSE  Objective- Pt. is visible in the dayroom, seen interacting with peers. Presents with an animated/anxious affect and mood. Seroquel was started at bedtime.Pt states he hopes to leave tomorrow.  Subjective- Denies having any SI/HI/AVH/Pain at this time.Is cooperative and remains safe on the unit.  1:1 interaction in private to establish rapport. Encouragement, education, & support given from staff.    Safety maintained with Q 15 checks. Continue with POC.

## 2017-12-16 NOTE — [facility] Group Notes (Signed)
Pt attended spiritual care group on grief and loss facilitated by chaplain Thomas [name]   Group opened with brief discussion and psycho-social ed around grief and loss in relationships and in relation to self - identifying life patterns, circumstances, changes that cause losses. Established group norm of speaking from own life experience. Group goal of establishing open and affirming space for members to share loss and experience with grief, normalize grief experience and provide psycho social education and grief support.   Thomas [name] arrived at group late.  Attentive to group conversation.  He engaged in group discussion when talking about grief journey.  Stated following his mother and father's death, he was disoriented and overwhelmed to the point of feeling suicidal.  Normalized these feelings for other group members.  Offered that his experience changed over time and he reaches out to his children now when he begins to feel overwhelmed.  He has arrived at a point where he is able to honor his mother and father by "thinknig of the good things."  Spoke about preparing for difficult days - holidays, birthdays, etc.    [facility] / [facility] Chaplain Thomas KingfisherMatthew Berneta Sconyers, MDiv, [facility]

## 2017-12-16 NOTE — [facility] Group Notes (Signed)
Adult Psychoeducational Group Note  Date:  12/16/2017 Time:  5:42 PM  Group Topic/Focus:  Personal Choices and Values:   The focus of this group is to help patients assess and explore the importance of values in their lives, how their values affect their decisions, how they express their values and what opposes their expression.  Participation Level:  Active  Participation Quality:  Appropriate  Affect:  Appropriate  Cognitive:  Alert and Appropriate  Insight: Appropriate and Good  Engagement in Group:  Engaged  Modes of Intervention:  Discussion and Exploration  Additional Comments:  Patient attended and actively participated in group.  Patient was asked to read a poem dealing with recovery and discuss how that poem applied to their current situation.  They were then asked to identified barriers to change and how they would handle challenges once they were discharged to keep them on the "right path".    Thomas [name] 12/16/2017, 5:42 PM 

## 2017-12-17 DIAGNOSIS — G4700 Insomnia, unspecified: Secondary | ICD-10-CM

## 2017-12-17 DIAGNOSIS — F314 Bipolar disorder, current episode depressed, severe, without psychotic features: Secondary | ICD-10-CM

## 2017-12-17 MED ORDER — QUETIAPINE FUMARATE 50 MG PO TABS: 50.0000 mg | ORAL_TABLET | Freq: Every day | ORAL | 0 refills | Status: DC

## 2017-12-17 MED ORDER — DIVALPROEX SODIUM 250 MG PO DR TAB: 250.0000 mg | DELAYED_RELEASE_TABLET | Freq: Two times a day (BID) | ORAL | 0 refills | Status: DC

## 2017-12-17 NOTE — Progress Notes (Signed)
  [facility] :  Will you be returning to the same living situation after discharge:  Yes,  home At discharge, do you have transportation home?: Yes,  bus pass provided Do you have the ability to pay for your medications: Yes,  mental health  Release of information consent forms completed and submitted.   Patient to Follow up at: Follow-up Information    [facility] Follow up on 12/23/2017.   Why:  Hospital follow-up on Monday 3/11 at 8AM. Please bring: Picture ID, hospital discharge paperwork, and any proof of income if you have it. Thank you.  Contact information: [address] [place] [address] [phone]           Next level of care provider has access to [facility] Link:no  Safety Planning and Suicide Prevention discussed: Yes,  SPE completed with pt; pt declined to consent to family contact. SPI pamphlet and Mobile Crisis information provided to pt.   Have you used any form of tobacco in the last 30 days? (Cigarettes, Smokeless Tobacco, Cigars, and/or Pipes): Yes  Has patient been referred to the Quitline?: Patient refused referral  Patient has been referred for addiction treatment: Yes  Pulte HomesHeather N Smart, LCSW 12/17/2017, 8:42 AM
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
## 2017-12-17 NOTE — Progress Notes (Signed)
Psychoeducational Group Note  Date:  12/17/2017 Time:  0937  Group Topic/Focus:  Goals Group:   The focus of this group is to help patients establish daily goals to achieve during treatment and discuss how the patient can incorporate goal setting into their daily lives to aide in recovery.  Participation Level: Did Not Attend  Participation Quality:  Not Applicable  Affect:  Not Applicable  Cognitive:  Not Applicable  Insight:  Not Applicable  Engagement in Group: Not Applicable  Additional Comments:  Pt refused to wake up this morning and come to group.  [name] E 12/17/2017, 9:37 AM

## 2017-12-17 NOTE — Progress Notes (Signed)
Data. Patient denies SI/HI/AVH. Verbally contracts for safety on the unit and to come to staff before acting of any self harm thoughts/feelings/voices.  Patient interacting well with staff and other patients.  Action. Emotional support and encouragement offered. Education provided on medication, indications and side effect. Q 15 minute checks done for safety. Response. Safety on the unit maintained through 15 minute checks.  Medications taken as prescribed. . Remained calm and appropriate through out shift.  Pt. discharged to lobby.  Belongings sheet reviewed and signed by pt. and all belongings, including sample meds and scripts, sent home. Paperwork reviewed and pt. able to verbalize understanding of education. Pt. in no current distress and ambulatory.

## 2018-01-04 ENCOUNTER — Encounter (HOSPITAL_COMMUNITY): Payer: Self-pay | Admitting: Emergency Medicine

## 2018-01-04 ENCOUNTER — Emergency Department (HOSPITAL_COMMUNITY)
Admission: EM | Admit: 2018-01-04 | Discharge: 2018-01-05 | Disposition: A | Discharge status: Home / Self Care | Payer: Self-pay | Attending: Emergency Medicine | Admitting: Emergency Medicine

## 2018-01-04 ENCOUNTER — Other Ambulatory Visit: Payer: Self-pay

## 2018-01-04 DIAGNOSIS — F329 Major depressive disorder, single episode, unspecified: Secondary | ICD-10-CM | POA: Insufficient documentation

## 2018-01-04 DIAGNOSIS — R451 Restlessness and agitation: Secondary | ICD-10-CM | POA: Insufficient documentation

## 2018-01-04 DIAGNOSIS — F17210 Nicotine dependence, cigarettes, uncomplicated: Secondary | ICD-10-CM | POA: Insufficient documentation

## 2018-01-04 DIAGNOSIS — R45850 Homicidal ideations: Secondary | ICD-10-CM | POA: Insufficient documentation

## 2018-01-04 DIAGNOSIS — Z79899 Other long term (current) drug therapy: Secondary | ICD-10-CM | POA: Insufficient documentation

## 2018-01-04 DIAGNOSIS — F319 Bipolar disorder, unspecified: Secondary | ICD-10-CM | POA: Insufficient documentation

## 2018-01-04 LAB — CBC
HEMATOCRIT: 48.3 % (ref 39.0–52.0)
Hemoglobin: 16.4 g/dL (ref 13.0–17.0)
MCH: 31.4 pg (ref 26.0–34.0)
MCHC: 34 g/dL (ref 30.0–36.0)
MCV: 92.4 fL (ref 78.0–100.0)
PLATELETS: 314 10*3/uL (ref 150–400)
RBC: 5.23 MIL/uL (ref 4.22–5.81)
RDW: 14.4 % (ref 11.5–15.5)
WBC: 6.1 10*3/uL (ref 4.0–10.5)

## 2018-01-04 LAB — RAPID URINE DRUG SCREEN, HOSP PERFORMED
Amphetamines: NOT DETECTED
BARBITURATES: NOT DETECTED
BENZODIAZEPINES: POSITIVE — AB
Cocaine: POSITIVE — AB
Opiates: NOT DETECTED
Tetrahydrocannabinol: POSITIVE — AB

## 2018-01-04 LAB — COMPREHENSIVE METABOLIC PANEL
ALBUMIN: 3.9 g/dL (ref 3.5–5.0)
ALT: 24 U/L (ref 17–63)
AST: 29 U/L (ref 15–41)
Alkaline Phosphatase: 99 U/L (ref 38–126)
Anion gap: 9 (ref 5–15)
BUN: 10 mg/dL (ref 6–20)
CHLORIDE: 103 mmol/L (ref 101–111)
CO2: 26 mmol/L (ref 22–32)
Calcium: 9.2 mg/dL (ref 8.9–10.3)
Creatinine, Ser: 1.29 mg/dL — ABNORMAL HIGH (ref 0.61–1.24)
GFR calc Af Amer: >60 mL/min (ref >60)
Glucose, Bld: 107 mg/dL — ABNORMAL HIGH (ref 65–99)
POTASSIUM: 3.8 mmol/L (ref 3.5–5.1)
Sodium: 138 mmol/L (ref 135–145)
Total Bilirubin: 1.2 mg/dL (ref 0.3–1.2)
Total Protein: 8.3 g/dL — ABNORMAL HIGH (ref 6.5–8.1)

## 2018-01-04 LAB — ACETAMINOPHEN LEVEL: Acetaminophen (Tylenol), Serum: <10 ug/mL — ABNORMAL LOW (ref 10–30)

## 2018-01-04 LAB — SALICYLATE LEVEL: Salicylate Lvl: <7 mg/dL (ref 2.8–30.0)

## 2018-01-04 LAB — ETHANOL

## 2018-01-04 MED ORDER — VITAMIN B-1 100 MG PO TABS
100.0000 mg | ORAL_TABLET | Freq: Every day | ORAL | Status: DC
  Administered 2018-01-04 – 2018-01-05 (×2): 100 mg via ORAL
  Filled 2018-01-04 (×2): qty 1

## 2018-01-04 MED ORDER — THIAMINE HCL 100 MG/ML IJ SOLN: 100.0000 mg | Freq: Every day | INTRAMUSCULAR | Status: DC

## 2018-01-04 MED ORDER — LORAZEPAM 2 MG/ML IJ SOLN: 0.0000 mg | Freq: Four times a day (QID) | INTRAMUSCULAR | Status: DC

## 2018-01-04 MED ORDER — LORAZEPAM 1 MG PO TABS: 0.0000 mg | ORAL_TABLET | Freq: Four times a day (QID) | ORAL | Status: DC

## 2018-01-04 MED ORDER — ONDANSETRON HCL 4 MG PO TABS: 4.0000 mg | ORAL_TABLET | Freq: Three times a day (TID) | ORAL | Status: DC | PRN

## 2018-01-04 MED ORDER — NICOTINE 21 MG/24HR TD PT24: 21.0000 mg | MEDICATED_PATCH | Freq: Every day | TRANSDERMAL | Status: DC

## 2018-01-04 MED ORDER — DIVALPROEX SODIUM 250 MG PO DR TAB
250.0000 mg | DELAYED_RELEASE_TABLET | Freq: Two times a day (BID) | ORAL | Status: DC
  Administered 2018-01-04 – 2018-01-05 (×2): 250 mg via ORAL
  Filled 2018-01-04 (×2): qty 1

## 2018-01-04 MED ORDER — LORAZEPAM 1 MG PO TABS: 0.0000 mg | ORAL_TABLET | Freq: Two times a day (BID) | ORAL | Status: DC

## 2018-01-04 MED ORDER — QUETIAPINE FUMARATE 50 MG PO TABS
50.0000 mg | ORAL_TABLET | Freq: Every day | ORAL | Status: DC
  Administered 2018-01-04: 50 mg via ORAL
  Filled 2018-01-04 (×2): qty 1

## 2018-01-04 MED ORDER — LORAZEPAM 2 MG/ML IJ SOLN: 0.0000 mg | Freq: Two times a day (BID) | INTRAMUSCULAR | Status: DC

## 2018-01-04 MED ORDER — ALUM & MAG HYDROXIDE-SIMETH 200-200-20 MG/5ML PO SUSP: 30.0000 mL | Freq: Four times a day (QID) | ORAL | Status: DC | PRN

## 2018-01-04 MED ORDER — IBUPROFEN 400 MG PO TABS
600.0000 mg | ORAL_TABLET | Freq: Three times a day (TID) | ORAL | Status: DC | PRN
  Administered 2018-01-04 – 2018-01-05 (×2): 600 mg via ORAL
  Filled 2018-01-04 (×2): qty 1
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
## 2018-01-05 ENCOUNTER — Other Ambulatory Visit: Payer: Self-pay

## 2018-01-05 ENCOUNTER — Emergency Department (HOSPITAL_COMMUNITY): Payer: Self-pay

## 2018-01-05 DIAGNOSIS — R45851 Suicidal ideations: Secondary | ICD-10-CM

## 2018-01-05 DIAGNOSIS — F329 Major depressive disorder, single episode, unspecified: Secondary | ICD-10-CM

## 2018-01-05 DIAGNOSIS — F17210 Nicotine dependence, cigarettes, uncomplicated: Secondary | ICD-10-CM

## 2018-01-05 MED ORDER — DIVALPROEX SODIUM 250 MG PO DR TAB: 250.0000 mg | DELAYED_RELEASE_TABLET | Freq: Two times a day (BID) | ORAL | 0 refills | Status: DC

## 2018-01-05 MED ORDER — QUETIAPINE FUMARATE 50 MG PO TABS: 50.0000 mg | ORAL_TABLET | Freq: Every day | ORAL | 0 refills | Status: DC

## 2018-01-05 NOTE — ED Notes (Addendum)
[facility] and feels he needs to be inpt for longer period of time. Denies SI/HI. Denies withdrawal symptoms.

## 2018-01-05 NOTE — ED Notes (Signed)
Pt noted to be ambulatory - steady gait noted throughout the day - after aware x-ray negative, pt began limping. Pt stated he needs a bus pass [facility] "I am trying to get some help and get admitted". Advised we are all a part of same system - stated "I'm still going". Security and [employer] escorted pt from ED.

## 2018-01-05 NOTE — ED Notes (Signed)
Pt given sprite and sugar free cookies @ snack time.
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
## 2018-01-05 NOTE — ED Notes (Addendum)
Breakfast tray ordered 

## 2018-01-05 NOTE — ED Notes (Signed)
Pt c/o left ankle pain x a few days. Ask for something for pain.

## 2018-01-05 NOTE — ED Notes (Signed)
Pt being transported to x-ray via w/c - Sitter w/pt. 

## 2018-01-05 NOTE — ED Notes (Signed)
Ibuprofen given for c/o right ankle pain - states he fell x 2 days ago - states "I sprained my ankle and I need the dr to see it". States was evaluated by EDP upon arrival to ED.

## 2018-01-05 NOTE — ED Notes (Addendum)
Charge RN aware of Staffing Office no Sitter available for pt. Pt being observed by staff.

## 2018-01-05 NOTE — ED Notes (Signed)
Returned from xray

## 2018-01-05 NOTE — ED Notes (Signed)
Pt noted to be irritable after Telepscyh performed - States "I don't know who the f--- she thinks she is". Staff allowed pt to vent feelings/concerns. Pt has noted to be lying on bed, calm, cooperative this am prior to Telepsych. Pt denies SI/HI.

## 2018-01-05 NOTE — Discharge Instructions (Addendum)
[facility]- [address], [place], [place] [id number] [phone].

## 2018-01-05 NOTE — ED Notes (Signed)
Pt lunch order placed. Lunch: Thomas [name]. Mac & Cheese. Steamed Cabbage. Drink: Sweet Tea

## 2018-01-05 NOTE — ED Notes (Signed)
Pt asking for Sprite - advised may have water until snack time - 1000. Voiced understanding.

## 2018-01-05 NOTE — Progress Notes (Signed)
Patient to be discharged per Inetta Fermoina, NP with [facility]'s contact information. CSW has notified Kriste BasqueBecky of discharged plans and updated the discharge summary with [facility]'s contact information.  Moss McKy-Sha Donivin Wirt MSW, LCSW-A, LCAS-A Clinical Social Worker 01/05/2018 11:52 AM

## 2018-01-16 ENCOUNTER — Encounter (HOSPITAL_COMMUNITY): Payer: Self-pay | Admitting: Emergency Medicine

## 2018-01-16 ENCOUNTER — Other Ambulatory Visit: Payer: Self-pay

## 2018-01-16 ENCOUNTER — Emergency Department (HOSPITAL_COMMUNITY)
Admission: EM | Admit: 2018-01-16 | Discharge: 2018-01-16 | Disposition: A | Discharge status: Home / Self Care | Payer: Self-pay | Attending: Emergency Medicine | Admitting: Emergency Medicine

## 2018-01-16 DIAGNOSIS — F10129 Alcohol abuse with intoxication, unspecified: Secondary | ICD-10-CM | POA: Diagnosis present

## 2018-01-16 DIAGNOSIS — F314 Bipolar disorder, current episode depressed, severe, without psychotic features: Secondary | ICD-10-CM | POA: Insufficient documentation

## 2018-01-16 DIAGNOSIS — F1220 Cannabis dependence, uncomplicated: Secondary | ICD-10-CM | POA: Insufficient documentation

## 2018-01-16 DIAGNOSIS — F10120 Alcohol abuse with intoxication, uncomplicated: Secondary | ICD-10-CM | POA: Insufficient documentation

## 2018-01-16 DIAGNOSIS — F17210 Nicotine dependence, cigarettes, uncomplicated: Secondary | ICD-10-CM | POA: Insufficient documentation

## 2018-01-16 DIAGNOSIS — F1410 Cocaine abuse, uncomplicated: Secondary | ICD-10-CM | POA: Diagnosis present

## 2018-01-16 DIAGNOSIS — Y905 Blood alcohol level of 100-119 mg/100 ml: Secondary | ICD-10-CM | POA: Insufficient documentation

## 2018-01-16 DIAGNOSIS — F1994 Other psychoactive substance use, unspecified with psychoactive substance-induced mood disorder: Secondary | ICD-10-CM | POA: Diagnosis present

## 2018-01-16 DIAGNOSIS — F1420 Cocaine dependence, uncomplicated: Secondary | ICD-10-CM | POA: Insufficient documentation

## 2018-01-16 DIAGNOSIS — Z79899 Other long term (current) drug therapy: Secondary | ICD-10-CM | POA: Insufficient documentation

## 2018-01-16 DIAGNOSIS — R45851 Suicidal ideations: Secondary | ICD-10-CM | POA: Insufficient documentation

## 2018-01-16 DIAGNOSIS — Z046 Encounter for general psychiatric examination, requested by authority: Secondary | ICD-10-CM | POA: Insufficient documentation

## 2018-01-16 LAB — URINALYSIS, ROUTINE W REFLEX MICROSCOPIC
BILIRUBIN URINE: NEGATIVE
Glucose, UA: NEGATIVE mg/dL
Hgb urine dipstick: NEGATIVE
KETONES UR: NEGATIVE mg/dL
Nitrite: NEGATIVE
PH: 6 (ref 5.0–8.0)
Protein, ur: NEGATIVE mg/dL
SPECIFIC GRAVITY, URINE: 1.008 (ref 1.005–1.030)

## 2018-01-16 LAB — SALICYLATE LEVEL: Salicylate Lvl: <7 mg/dL (ref 2.8–30.0)

## 2018-01-16 LAB — CBC WITH DIFFERENTIAL/PLATELET
Basophils Absolute: 0.1 10*3/uL (ref 0.0–0.1)
Basophils Relative: 1 %
EOS PCT: 1 %
Eosinophils Absolute: 0.1 10*3/uL (ref 0.0–0.7)
HEMATOCRIT: 43.5 % (ref 39.0–52.0)
Hemoglobin: 14.9 g/dL (ref 13.0–17.0)
LYMPHS ABS: 2.4 10*3/uL (ref 0.7–4.0)
LYMPHS PCT: 38 %
MCH: 31.5 pg (ref 26.0–34.0)
MCHC: 34.3 g/dL (ref 30.0–36.0)
MCV: 92 fL (ref 78.0–100.0)
MONO ABS: 0.4 10*3/uL (ref 0.1–1.0)
MONOS PCT: 6 %
Neutro Abs: 3.5 10*3/uL (ref 1.7–7.7)
Neutrophils Relative %: 54 %
PLATELETS: 247 10*3/uL (ref 150–400)
RBC: 4.73 MIL/uL (ref 4.22–5.81)
RDW: 14.2 % (ref 11.5–15.5)
WBC: 6.4 10*3/uL (ref 4.0–10.5)

## 2018-01-16 LAB — BASIC METABOLIC PANEL
Anion gap: 12 (ref 5–15)
BUN: 13 mg/dL (ref 6–20)
CALCIUM: 8.7 mg/dL — AB (ref 8.9–10.3)
CO2: 23 mmol/L (ref 22–32)
Chloride: 107 mmol/L (ref 101–111)
Creatinine, Ser: 1.39 mg/dL — ABNORMAL HIGH (ref 0.61–1.24)
GFR calc Af Amer: >60 mL/min (ref >60)
GFR, EST NON AFRICAN AMERICAN: 60 mL/min — AB (ref >60)
GLUCOSE: 101 mg/dL — AB (ref 65–99)
Potassium: 3.7 mmol/L (ref 3.5–5.1)
Sodium: 142 mmol/L (ref 135–145)

## 2018-01-16 LAB — ACETAMINOPHEN LEVEL

## 2018-01-16 LAB — RAPID URINE DRUG SCREEN, HOSP PERFORMED
Amphetamines: NOT DETECTED
BARBITURATES: NOT DETECTED
BENZODIAZEPINES: NOT DETECTED
COCAINE: POSITIVE — AB
Opiates: NOT DETECTED
Tetrahydrocannabinol: POSITIVE — AB

## 2018-01-16 LAB — ETHANOL: Alcohol, Ethyl (B): 106 mg/dL — ABNORMAL HIGH (ref <10)

## 2018-01-16 MED ORDER — QUETIAPINE FUMARATE 50 MG PO TABS: 50.0000 mg | ORAL_TABLET | Freq: Every day | ORAL | 0 refills | Status: DC

## 2018-01-16 MED ORDER — ONDANSETRON HCL 4 MG PO TABS: 4.0000 mg | ORAL_TABLET | Freq: Three times a day (TID) | ORAL | Status: DC | PRN

## 2018-01-16 MED ORDER — DIVALPROEX SODIUM 250 MG PO DR TAB: 250.0000 mg | DELAYED_RELEASE_TABLET | Freq: Two times a day (BID) | ORAL | 0 refills | Status: DC

## 2018-01-16 MED ORDER — QUETIAPINE FUMARATE 50 MG PO TABS: 50.0000 mg | ORAL_TABLET | Freq: Every day | ORAL | Status: DC

## 2018-01-16 MED ORDER — NICOTINE 21 MG/24HR TD PT24: 21.0000 mg | MEDICATED_PATCH | Freq: Every day | TRANSDERMAL | Status: DC

## 2018-01-16 MED ORDER — DIVALPROEX SODIUM 250 MG PO DR TAB
250.0000 mg | DELAYED_RELEASE_TABLET | Freq: Two times a day (BID) | ORAL | Status: DC
  Administered 2018-01-16: 250 mg via ORAL
  Filled 2018-01-16: qty 1

## 2018-01-16 NOTE — ED Notes (Signed)
Pt given meal tray.
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
## 2018-01-16 NOTE — ED Triage Notes (Signed)
Pt arriving with [employer] voluntarily requesting assistance with refilling his medications. Pt states he has been without his medication for a couple months and is beginning to feel suicidal. Pt also states that he has been "lashing out" at people.

## 2018-01-16 NOTE — ED Notes (Signed)
Patient alert, oriented, calm and cooperative.  Reports he is still feeling suicidal with a plan to jump out of a moving car.  Patient says he needs his medications refilled.

## 2018-01-16 NOTE — ED Notes (Signed)
[facility] Expected date:  Expected time:  Means of arrival:  Comments: Hold for rm 15

## 2018-01-16 NOTE — Patient Outreach (Signed)
CPSS met with the patient and provided substance use support. Patient is interested in finding a mental health/substance use recovery support group. CPSS will provide the patient with [facility] meeting list, NA/AA meeting list, and CPSS contact information. CPSS encouraged the patient to contact CPSS at anytime for substance use recovery support or help with resources.

## 2018-01-16 NOTE — ED Notes (Signed)
Peer support at bedside talking to patient.

## 2018-01-16 NOTE — BH Assessment (Signed)
[facility] Assessment Progress Note  Per Jacqueline Norman, DO, this pt does not require psychiatric hospitalization at this time.  Pt is to be discharged from [facility] with recommendation to follow up with [facility].  This has been included in pt's discharge instructions.  Pt's nurse, Cynthia, has been notified.  Thomas Gaxiola, MA Triage Specialist [phone]     

## 2018-01-16 NOTE — BHH Suicide Risk Assessment (Signed)
Suicide Risk Assessment  Discharge Assessment   [facility]   Principal Problem: Substance induced mood disorder [facility](HCC) Discharge Diagnoses:  Patient Active Problem List   Diagnosis Date Noted  . Alcohol abuse with intoxication, uncomplicated (HCC) [F10.120] 04/20/2015    Priority: High  . Substance induced mood disorder (HCC) [F19.94] 03/21/2015    Priority: High  . Cocaine abuse (HCC) [F14.10] 03/12/2012    Priority: High  . Severe bipolar I disorder, most recent episode depressed (HCC) [F31.4] 12/13/2017  . Bipolar 1 disorder (HCC) [F31.9] 12/12/2017  . Aggressive behavior [R46.89]   . Homicidal ideation [R45.850]   . Bipolar I disorder, most recent episode (or current) mixed, in partial or unspecified remission [F31.77] 03/19/2015  . Suicide attempt (HCC) [T14.91XA]   . Mood disorder (HCC) [F39] 03/12/2012  . Alcohol abuse [F10.10] 03/12/2012    Total Time spent with patient: 45 minutes  Musculoskeletal: Strength & Muscle Tone: within normal limits Gait & Station: normal Patient leans: N/A  Psychiatric Specialty Exam:   Blood pressure 113/77, pulse 89, temperature 98.4 F (36.9 C), temperature source Oral, resp. rate 16, SpO2 97 %.There is no height or weight on file to calculate BMI.  General Appearance: Casual  Eye Contact::  Good  Speech:  Normal Rate409  Volume:  Normal  Mood:  Irritable, slightly  Affect:  Congruent  Thought Process:  Coherent and Descriptions of Associations: Intact  Orientation:  Full (Time, Place, and Person)  Thought Content:  WDL and Logical  Suicidal Thoughts:  No  Homicidal Thoughts:  No  Memory:  Immediate;   Good Recent;   Good Remote;   Good  Judgement:  Fair  Insight:  Fair  Psychomotor Activity:  Normal  Concentration:  Good  Recall:  Good  Fund of Knowledge:Fair  Language: Good  Akathisia:  No  Handed:  Right  AIMS (if indicated):     Assets:  Leisure Time Physical Health Resilience  Sleep:      Cognition: WNL  ADL's:  Intact   Mental Status Per Nursing Assessment::   On Admission:   47 yo male who presented to the ED under the influence of alcohol and cocaine wanting his Rx.  He recently discharged and is now out of his Depakote and Seroquel.  Rx provided, no suicidal/homicidal ideations, hallucinations, or withdrawal symptoms.  STable for discharge.  Demographic Factors:  Male  Loss Factors: NA  Historical Factors: NA  Risk Reduction Factors:   Sense of responsibility to family, Positive social support and Positive therapeutic relationship  Continued Clinical Symptoms:  Irritable, mild  Cognitive Features That Contribute To Risk:  None    Suicide Risk:  Minimal: No identifiable suicidal ideation.  Patients presenting with no risk factors but with morbid ruminations; may be classified as minimal risk based on the severity of the depressive symptoms    Plan Of Care/Follow-up recommendations:  Activity:  as tolerated Diet:  as tolerated  LORD, JAMISON, NP 01/16/2018, 10:56 AM
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
## 2018-01-16 NOTE — Discharge Instructions (Signed)
For your mental health needs, you are advised to follow up with [facility].  New and returning patients are seen at their walk-in clinic.  Walk-in hours are Monday - Friday from 8:00 am - 3:00 pm.  Walk-in patients are seen on a first come, first served basis.  Try to arrive as early as possible for he best chance of being seen the same day: ° °     [facility] °     [address] °     [place], [place] [address] °     [phone] °

## 2018-01-16 NOTE — ED Notes (Signed)
Patient belongings placed in locker #34.

## 2018-07-04 ENCOUNTER — Encounter (HOSPITAL_COMMUNITY): Payer: Self-pay

## 2018-07-04 ENCOUNTER — Emergency Department (HOSPITAL_COMMUNITY)
Admission: EM | Admit: 2018-07-04 | Discharge: 2018-07-04 | Disposition: A | Discharge status: Home / Self Care | Payer: Self-pay | Attending: Emergency Medicine | Admitting: Emergency Medicine

## 2018-07-04 DIAGNOSIS — Y999 Unspecified external cause status: Secondary | ICD-10-CM | POA: Insufficient documentation

## 2018-07-04 DIAGNOSIS — Z23 Encounter for immunization: Secondary | ICD-10-CM | POA: Insufficient documentation

## 2018-07-04 DIAGNOSIS — F1910 Other psychoactive substance abuse, uncomplicated: Secondary | ICD-10-CM

## 2018-07-04 DIAGNOSIS — X58XXXA Exposure to other specified factors, initial encounter: Secondary | ICD-10-CM | POA: Insufficient documentation

## 2018-07-04 DIAGNOSIS — F1020 Alcohol dependence, uncomplicated: Secondary | ICD-10-CM | POA: Insufficient documentation

## 2018-07-04 DIAGNOSIS — S41112A Laceration without foreign body of left upper arm, initial encounter: Secondary | ICD-10-CM | POA: Insufficient documentation

## 2018-07-04 DIAGNOSIS — F17210 Nicotine dependence, cigarettes, uncomplicated: Secondary | ICD-10-CM | POA: Insufficient documentation

## 2018-07-04 DIAGNOSIS — F1420 Cocaine dependence, uncomplicated: Secondary | ICD-10-CM | POA: Insufficient documentation

## 2018-07-04 DIAGNOSIS — F3132 Bipolar disorder, current episode depressed, moderate: Secondary | ICD-10-CM | POA: Insufficient documentation

## 2018-07-04 DIAGNOSIS — R4689 Other symptoms and signs involving appearance and behavior: Secondary | ICD-10-CM

## 2018-07-04 DIAGNOSIS — Y929 Unspecified place or not applicable: Secondary | ICD-10-CM | POA: Insufficient documentation

## 2018-07-04 DIAGNOSIS — Z79899 Other long term (current) drug therapy: Secondary | ICD-10-CM | POA: Insufficient documentation

## 2018-07-04 DIAGNOSIS — Y939 Activity, unspecified: Secondary | ICD-10-CM | POA: Insufficient documentation

## 2018-07-04 DIAGNOSIS — Z87828 Personal history of other (healed) physical injury and trauma: Secondary | ICD-10-CM

## 2018-07-04 DIAGNOSIS — F1220 Cannabis dependence, uncomplicated: Secondary | ICD-10-CM | POA: Insufficient documentation

## 2018-07-04 LAB — CBC
HEMATOCRIT: 47.5 % (ref 39.0–52.0)
HEMOGLOBIN: 15.7 g/dL (ref 13.0–17.0)
MCH: 29.8 pg (ref 26.0–34.0)
MCHC: 33.1 g/dL (ref 30.0–36.0)
MCV: 90.1 fL (ref 78.0–100.0)
Platelets: 255 10*3/uL (ref 150–400)
RBC: 5.27 MIL/uL (ref 4.22–5.81)
RDW: 13.7 % (ref 11.5–15.5)
WBC: 7.5 10*3/uL (ref 4.0–10.5)

## 2018-07-04 LAB — COMPREHENSIVE METABOLIC PANEL
ALBUMIN: 4.2 g/dL (ref 3.5–5.0)
ALK PHOS: 76 U/L (ref 38–126)
ALT: 17 U/L (ref 0–44)
ANION GAP: 14 (ref 5–15)
AST: 34 U/L (ref 15–41)
BUN: 10 mg/dL (ref 6–20)
CALCIUM: 8.9 mg/dL (ref 8.9–10.3)
CO2: 20 mmol/L — AB (ref 22–32)
Chloride: 105 mmol/L (ref 98–111)
Creatinine, Ser: 0.97 mg/dL (ref 0.61–1.24)
GFR calc Af Amer: >60 mL/min (ref >60)
GFR calc non Af Amer: >60 mL/min (ref >60)
GLUCOSE: 94 mg/dL (ref 70–99)
Potassium: 3.9 mmol/L (ref 3.5–5.1)
Sodium: 139 mmol/L (ref 135–145)
TOTAL PROTEIN: 7.8 g/dL (ref 6.5–8.1)
Total Bilirubin: 0.5 mg/dL (ref 0.3–1.2)

## 2018-07-04 LAB — ETHANOL: Alcohol, Ethyl (B): 163 mg/dL — ABNORMAL HIGH (ref <10)

## 2018-07-04 LAB — SALICYLATE LEVEL: Salicylate Lvl: <7 mg/dL (ref 2.8–30.0)

## 2018-07-04 LAB — ACETAMINOPHEN LEVEL: Acetaminophen (Tylenol), Serum: <10 ug/mL — ABNORMAL LOW (ref 10–30)

## 2018-07-04 MED ORDER — LIDOCAINE-EPINEPHRINE-TETRACAINE (LET) SOLUTION
3.0000 mL | Freq: Once | NASAL | Status: AC
  Administered 2018-07-04: 3 mL via TOPICAL
  Filled 2018-07-04: qty 3

## 2018-07-04 MED ORDER — ACETAMINOPHEN 325 MG PO TABS
650.0000 mg | ORAL_TABLET | Freq: Once | ORAL | Status: AC
  Administered 2018-07-04: 650 mg via ORAL
  Filled 2018-07-04: qty 2

## 2018-07-04 MED ORDER — ONDANSETRON HCL 4 MG PO TABS: 4.0000 mg | ORAL_TABLET | Freq: Three times a day (TID) | ORAL | Status: DC | PRN

## 2018-07-04 MED ORDER — ALUM & MAG HYDROXIDE-SIMETH 200-200-20 MG/5ML PO SUSP: 30.0000 mL | Freq: Four times a day (QID) | ORAL | Status: DC | PRN

## 2018-07-04 MED ORDER — DIPHENHYDRAMINE HCL 50 MG/ML IJ SOLN
12.5000 mg | Freq: Once | INTRAMUSCULAR | Status: AC
  Administered 2018-07-04: 12.5 mg via INTRAVENOUS
  Filled 2018-07-04: qty 1

## 2018-07-04 MED ORDER — HALOPERIDOL LACTATE 5 MG/ML IJ SOLN
10.0000 mg | Freq: Once | INTRAMUSCULAR | Status: AC
  Administered 2018-07-04: 10 mg via INTRAMUSCULAR
  Filled 2018-07-04: qty 2

## 2018-07-04 MED ORDER — NICOTINE 21 MG/24HR TD PT24
21.0000 mg | MEDICATED_PATCH | Freq: Every day | TRANSDERMAL | Status: DC
  Filled 2018-07-04: qty 1

## 2018-07-04 MED ORDER — TETANUS-DIPHTH-ACELL PERTUSSIS 5-2.5-18.5 LF-MCG/0.5 IM SUSP
0.5000 mL | Freq: Once | INTRAMUSCULAR | Status: AC
  Administered 2018-07-04: 0.5 mL via INTRAMUSCULAR
  Filled 2018-07-04: qty 0.5

## 2018-07-04 MED ORDER — QUETIAPINE FUMARATE 50 MG PO TABS
50.0000 mg | ORAL_TABLET | Freq: Every day | ORAL | Status: DC
  Administered 2018-07-04: 50 mg via ORAL
  Filled 2018-07-04: qty 1

## 2018-07-04 MED ORDER — DIVALPROEX SODIUM 250 MG PO DR TAB
250.0000 mg | DELAYED_RELEASE_TABLET | Freq: Two times a day (BID) | ORAL | Status: DC
  Administered 2018-07-04 (×2): 250 mg via ORAL
  Filled 2018-07-04 (×2): qty 1

## 2018-07-04 NOTE — ED Notes (Signed)
TTS at bedside. 
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
## 2018-07-04 NOTE — ED Notes (Signed)
Pts belongings returned to him. Pt changing out of purple scrubs and into his clothes.

## 2018-07-04 NOTE — ED Notes (Signed)
Rec'd call from pt assessment; unable to perform at this time due to patient being medicated earlier.

## 2018-07-04 NOTE — ED Notes (Signed)
Patient left at this time with all belongings, signed for belongings. Pt states he had a cellphone, none found on belongings inventoried. Notified pt of this.

## 2018-07-04 NOTE — ED Notes (Signed)
Regular diet dinner tray ordered 

## 2018-07-04 NOTE — BH Assessment (Signed)
[facility] Assessment Progress Note   Clinician called [facility] to start teleassessment.  Nurse Thurston Poundsrey said that patient had been given 10mg  Haldol at 05:17 and was unable to participate in assessment at this time.  TTS to assess when pt is alert and oriented.
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
## 2018-07-04 NOTE — ED Provider Notes (Signed)
Patient presented early this morning for homicidal ideations and a laceration to the left forearm.  Laceration repaired and patient medically cleared by initial provider.  Patient has had TTS evaluation.  Patient evaluated by Venda RodesFord Warrick and per his note patient was discussed with Donell SievertSpencer Simon, PA who states the patient does not meet inpatient criteria and recommends follow-up with [facility] to restart his psychiatric medications.   Physical Exam  BP 115/66 (BP Location: Right Arm)   Pulse 68   Temp 98.2 F (36.8 C) (Oral)   Resp 18   SpO2 98%   Physical Exam  Constitutional: He appears well-developed and well-nourished. No distress.  HENT:  Head: Normocephalic.  Eyes: Conjunctivae are normal. No scleral icterus.  Neck: Normal range of motion.  Pulmonary/Chest: Effort normal.  Musculoskeletal: Normal range of motion.  Neurological: He is alert.  Patient ambulatory in room with steady gait.  Skin: Skin is warm and dry.  Laceration to the left forearm is without persistent bleeding.  No erythema, edema or drainage.  Nursing note and vitals reviewed.   ED Course/Procedures     Procedures  MDM   Patient's wound is clean and dry.  No signs of secondary infection.  [facility] with [facility]  Patient states understanding and is in agreement with the plan for outpatient management of his symptoms.  He is alert, speaking coherently and ambulatory without difficulty.   Substance abuse (HCC)  Aggressive behavior  History of laceration of skin        Samit Sylve, Boyd KerbsHannah, PA-C 07/04/18 2236    Mancel BaleWentz, Elliott, MD 07/06/18 [phone]

## 2018-07-04 NOTE — ED Notes (Signed)
Pt provided with bus pass

## 2018-07-04 NOTE — Discharge Instructions (Addendum)
1. Medications: Tylenol or ibuprofen for pain, usual home medications 2. Treatment: ice for swelling, keep wound clean with warm soap and water and keep bandage dry, do not submerge in water for 24 hours 3. Follow Up: Please return in 7 days to have your stitches/staples removed or sooner if you have concerns. Return to the emergency department for increased redness, drainage of pus from the wound.  [facility].     WOUND CARE  Keep area clean and dry for 24 hours. Do not remove bandage, if applied.  After 24 hours, remove bandage and wash wound gently with mild soap and warm water. Reapply a new bandage after cleaning wound, if directed.   Continue daily cleansing with soap and water until stitches/staples are removed.  Do not apply any ointments or creams to the wound while stitches/staples are in place, as this may cause delayed healing. Return if you experience any of the following signs of infection: Swelling, redness, pus drainage, streaking, fever >101.0 F  Return if you experience excessive bleeding that does not stop after 15-20 minutes of constant, firm pressure.
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
## 2019-02-02 ENCOUNTER — Inpatient Hospital Stay (HOSPITAL_COMMUNITY)
Admission: AD | Admit: 2019-02-02 | Discharge: 2019-02-05 | DRG: 885 | Disposition: A | Discharge status: Home / Self Care | Payer: No Typology Code available for payment source | Source: Intra-hospital | Attending: Psychiatry | Admitting: Psychiatry

## 2019-02-02 ENCOUNTER — Other Ambulatory Visit: Payer: Self-pay

## 2019-02-02 ENCOUNTER — Encounter (HOSPITAL_COMMUNITY): Payer: Self-pay

## 2019-02-02 ENCOUNTER — Encounter (HOSPITAL_COMMUNITY): Payer: Self-pay | Admitting: *Deleted

## 2019-02-02 ENCOUNTER — Emergency Department (HOSPITAL_COMMUNITY)
Admission: EM | Admit: 2019-02-02 | Discharge: 2019-02-02 | Disposition: A | Discharge status: Other Acute Inpatient Hospital | Payer: Self-pay | Attending: Emergency Medicine | Admitting: Emergency Medicine

## 2019-02-02 DIAGNOSIS — Z91128 Patient's intentional underdosing of medication regimen for other reason: Secondary | ICD-10-CM

## 2019-02-02 DIAGNOSIS — Y92009 Unspecified place in unspecified non-institutional (private) residence as the place of occurrence of the external cause: Secondary | ICD-10-CM | POA: Diagnosis not present

## 2019-02-02 DIAGNOSIS — T426X6A Underdosing of other antiepileptic and sedative-hypnotic drugs, initial encounter: Secondary | ICD-10-CM | POA: Diagnosis present

## 2019-02-02 DIAGNOSIS — F319 Bipolar disorder, unspecified: Secondary | ICD-10-CM | POA: Diagnosis present

## 2019-02-02 DIAGNOSIS — F332 Major depressive disorder, recurrent severe without psychotic features: Secondary | ICD-10-CM | POA: Diagnosis present

## 2019-02-02 DIAGNOSIS — F339 Major depressive disorder, recurrent, unspecified: Secondary | ICD-10-CM | POA: Diagnosis present

## 2019-02-02 DIAGNOSIS — Z765 Malingerer [conscious simulation]: Secondary | ICD-10-CM

## 2019-02-02 DIAGNOSIS — F1220 Cannabis dependence, uncomplicated: Secondary | ICD-10-CM | POA: Diagnosis present

## 2019-02-02 DIAGNOSIS — F1020 Alcohol dependence, uncomplicated: Secondary | ICD-10-CM | POA: Diagnosis present

## 2019-02-02 DIAGNOSIS — Z9114 Patient's other noncompliance with medication regimen: Secondary | ICD-10-CM

## 2019-02-02 DIAGNOSIS — R45850 Homicidal ideations: Secondary | ICD-10-CM | POA: Diagnosis present

## 2019-02-02 DIAGNOSIS — Z62811 Personal history of psychological abuse in childhood: Secondary | ICD-10-CM | POA: Diagnosis present

## 2019-02-02 DIAGNOSIS — Y904 Blood alcohol level of 80-99 mg/100 ml: Secondary | ICD-10-CM | POA: Diagnosis present

## 2019-02-02 DIAGNOSIS — Z62810 Personal history of physical and sexual abuse in childhood: Secondary | ICD-10-CM | POA: Diagnosis present

## 2019-02-02 DIAGNOSIS — F1410 Cocaine abuse, uncomplicated: Secondary | ICD-10-CM | POA: Diagnosis present

## 2019-02-02 DIAGNOSIS — T43596A Underdosing of other antipsychotics and neuroleptics, initial encounter: Secondary | ICD-10-CM | POA: Diagnosis present

## 2019-02-02 DIAGNOSIS — F10120 Alcohol abuse with intoxication, uncomplicated: Secondary | ICD-10-CM | POA: Diagnosis present

## 2019-02-02 DIAGNOSIS — Z590 Homelessness: Secondary | ICD-10-CM | POA: Diagnosis not present

## 2019-02-02 DIAGNOSIS — F17210 Nicotine dependence, cigarettes, uncomplicated: Secondary | ICD-10-CM | POA: Insufficient documentation

## 2019-02-02 DIAGNOSIS — Z046 Encounter for general psychiatric examination, requested by authority: Secondary | ICD-10-CM | POA: Insufficient documentation

## 2019-02-02 DIAGNOSIS — F10129 Alcohol abuse with intoxication, unspecified: Secondary | ICD-10-CM | POA: Diagnosis present

## 2019-02-02 DIAGNOSIS — R45851 Suicidal ideations: Secondary | ICD-10-CM | POA: Diagnosis present

## 2019-02-02 DIAGNOSIS — F330 Major depressive disorder, recurrent, mild: Secondary | ICD-10-CM | POA: Diagnosis present

## 2019-02-02 LAB — CBC WITH DIFFERENTIAL/PLATELET
Abs Immature Granulocytes: 0.01 10*3/uL (ref 0.00–0.07)
Basophils Absolute: 0.1 10*3/uL (ref 0.0–0.1)
Basophils Relative: 1 %
Eosinophils Absolute: 0.1 10*3/uL (ref 0.0–0.5)
Eosinophils Relative: 2 %
HCT: 46.8 % (ref 39.0–52.0)
Hemoglobin: 15.4 g/dL (ref 13.0–17.0)
Immature Granulocytes: 0 %
Lymphocytes Relative: 35 %
Lymphs Abs: 1.8 10*3/uL (ref 0.7–4.0)
MCH: 31.6 pg (ref 26.0–34.0)
MCHC: 32.9 g/dL (ref 30.0–36.0)
MCV: 95.9 fL (ref 80.0–100.0)
Monocytes Absolute: 0.5 10*3/uL (ref 0.1–1.0)
Monocytes Relative: 10 %
Neutro Abs: 2.7 10*3/uL (ref 1.7–7.7)
Neutrophils Relative %: 52 %
Platelets: 190 10*3/uL (ref 150–400)
RBC: 4.88 MIL/uL (ref 4.22–5.81)
RDW: 13.3 % (ref 11.5–15.5)
WBC: 5.1 10*3/uL (ref 4.0–10.5)
nRBC: 0 % (ref 0.0–0.2)

## 2019-02-02 LAB — COMPREHENSIVE METABOLIC PANEL
ALT: 25 U/L (ref 0–44)
AST: 27 U/L (ref 15–41)
Albumin: 3.9 g/dL (ref 3.5–5.0)
Alkaline Phosphatase: 81 U/L (ref 38–126)
Anion gap: 8 (ref 5–15)
BUN: 9 mg/dL (ref 6–20)
CO2: 27 mmol/L (ref 22–32)
Calcium: 9.1 mg/dL (ref 8.9–10.3)
Chloride: 103 mmol/L (ref 98–111)
Creatinine, Ser: 0.93 mg/dL (ref 0.61–1.24)
GFR calc Af Amer: >60 mL/min (ref >60)
GFR calc non Af Amer: >60 mL/min (ref >60)
Glucose, Bld: 88 mg/dL (ref 70–99)
Potassium: 3.8 mmol/L (ref 3.5–5.1)
Sodium: 138 mmol/L (ref 135–145)
Total Bilirubin: 0.6 mg/dL (ref 0.3–1.2)
Total Protein: 8.1 g/dL (ref 6.5–8.1)

## 2019-02-02 LAB — ACETAMINOPHEN LEVEL: Acetaminophen (Tylenol), Serum: <10 ug/mL — ABNORMAL LOW (ref 10–30)

## 2019-02-02 LAB — SALICYLATE LEVEL: Salicylate Lvl: <7 mg/dL (ref 2.8–30.0)

## 2019-02-02 LAB — TROPONIN I: Troponin I: <0.03 ng/mL (ref <0.03)

## 2019-02-02 LAB — ETHANOL: Alcohol, Ethyl (B): 80 mg/dL — ABNORMAL HIGH (ref <10)

## 2019-02-02 MED ORDER — IBUPROFEN 200 MG PO TABS: 600.0000 mg | ORAL_TABLET | Freq: Three times a day (TID) | ORAL | Status: DC | PRN

## 2019-02-02 MED ORDER — ALUM & MAG HYDROXIDE-SIMETH 200-200-20 MG/5ML PO SUSP: 30.0000 mL | Freq: Four times a day (QID) | ORAL | Status: DC | PRN

## 2019-02-02 MED ORDER — QUETIAPINE FUMARATE 50 MG PO TABS
50.0000 mg | ORAL_TABLET | Freq: Every day | ORAL | Status: DC
  Administered 2019-02-02: 50 mg via ORAL
  Filled 2019-02-02 (×3): qty 1

## 2019-02-02 MED ORDER — MAGNESIUM HYDROXIDE 400 MG/5ML PO SUSP: 30.0000 mL | Freq: Every day | ORAL | Status: DC | PRN

## 2019-02-02 MED ORDER — TRAZODONE HCL 50 MG PO TABS
50.0000 mg | ORAL_TABLET | Freq: Every evening | ORAL | Status: DC | PRN
  Administered 2019-02-02 – 2019-02-03 (×2): 50 mg via ORAL
  Filled 2019-02-02 (×2): qty 1

## 2019-02-02 MED ORDER — ONDANSETRON HCL 4 MG PO TABS: 4.0000 mg | ORAL_TABLET | Freq: Three times a day (TID) | ORAL | Status: DC | PRN

## 2019-02-02 MED ORDER — ACETAMINOPHEN 325 MG PO TABS: 650.0000 mg | ORAL_TABLET | Freq: Four times a day (QID) | ORAL | Status: DC | PRN

## 2019-02-02 MED ORDER — ALUM & MAG HYDROXIDE-SIMETH 200-200-20 MG/5ML PO SUSP
30.0000 mL | ORAL | Status: DC | PRN
  Administered 2019-02-05: 30 mL via ORAL
  Filled 2019-02-02: qty 30

## 2019-02-02 MED ORDER — DIVALPROEX SODIUM ER 250 MG PO TB24
250.0000 mg | ORAL_TABLET | Freq: Two times a day (BID) | ORAL | Status: DC
  Administered 2019-02-02 – 2019-02-05 (×6): 250 mg via ORAL
  Filled 2019-02-02 (×4): qty 1
  Filled 2019-02-02: qty 14
  Filled 2019-02-02 (×5): qty 1
  Filled 2019-02-02: qty 14
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
## 2019-02-02 NOTE — ED Notes (Signed)
Report called to BH.

## 2019-02-02 NOTE — ED Notes (Signed)
ROOM ASSIGNMENT: 301-2   Per Elijah Birk, report to be called between 4p-5p  Prior to leaving to [facility], pt will need to sign voluntary consent and needs to be faxed there.   Primary nurse made aware of all the above.

## 2019-02-02 NOTE — Progress Notes (Signed)
Thomas [name] is a 48 year old male pt admitted on voluntary basis. On admission, he reports that he has been off his medications for a couple months and cites the reason as [facility] being closed and he has been unable to get his medications. He reports that he has not had his seroquel or Depakote and also reports that he has not been sleeping. He does endorse depression and SI but is able to contract for safety while in the hospital. He reports that he is currently homeless and unsure of where he will be able to go once he is discharged. Thomas [name] was cooperative on admission, he was oriented to the milieu and safety maintained.

## 2019-02-02 NOTE — Tx Team (Signed)
Initial Treatment Plan 02/02/2019 6:01 PM Thomas [name]:[id number]    PATIENT STRESSORS: Medication change or noncompliance Substance abuse   PATIENT STRENGTHS: Ability for insight Average or above average intelligence Capable of independent living General fund of knowledge   PATIENT IDENTIFIED PROBLEMS: Depression Suicidal thoughts "I need to get back on my medicine"                     DISCHARGE CRITERIA:  Ability to meet basic life and health needs Improved stabilization in mood, thinking, and/or behavior Reduction of life-threatening or endangering symptoms to within safe limits Verbal commitment to aftercare and medication compliance  PRELIMINARY DISCHARGE PLAN: Attend aftercare/continuing care group  PATIENT/FAMILY INVOLVEMENT: This treatment plan has been presented to and reviewed with the patient, ROI HJORT, and/or family member, .  The patient and family have been given the opportunity to ask questions and make suggestions.  Mickel Schreur, [place], [place] 02/02/2019, 6:01 PM

## 2019-02-02 NOTE — BH Assessment (Signed)
[facility] Assessment Progress Note  Per Nanine Means, DNP, this pt requires psychiatric hospitalization at this time.  Berneice Heinrich, RN, [facility] has assigned pt to [facility] Rm 301-2; [facility] will be ready to receive pt between 16:00 and 17:00.  Pt is currently under voluntary status, and will need to sign Voluntary Admission and Consent for Treatment, and signed form will need to be faxed to [facility] at [phone] before pt can be transferred.  [facility] charge nurse, Lequita Halt, has been notified, and agrees to notify pt's nurse, Kathlene November, and have him send original paperwork along with pt via Juel Burrow, and to call report to [phone].  Doylene Canning, [place] Behavioral Health Coordinator [phone]

## 2019-02-02 NOTE — Progress Notes (Signed)
Patient did not attend wrap up group. 

## 2019-02-02 NOTE — Progress Notes (Signed)
D   Pt isolated to his room and has no interaction with others   He did come get his medications without prompting   He appears guarded and depressed A    Verbal support given   Medications administered and effectiveness monitored    Q 15 min checks R   Pt remains safe at this time  [place] NOVEL CORONAVIRUS (COVID-19) DAILY CHECK-OFF SYMPTOMS - answer yes or no to each - every day NO YES  Have you had a fever in the past 24 hours?  . Fever (Temp > 37.80C / 100F) X   Have you had any of these symptoms in the past 24 hours? . New Cough .  Sore Throat  .  Shortness of Breath .  Difficulty Breathing .  Unexplained Body Aches   X   Have you had any one of these symptoms in the past 24 hours not related to allergies?   . Runny Nose .  Nasal Congestion .  Sneezing   X   If you have had runny nose, nasal congestion, sneezing in the past 24 hours, has it worsened?  X   EXPOSURES - check yes or no X   Have you traveled outside the state in the past 14 days?  X   Have you been in contact with someone with a confirmed diagnosis of COVID-19 or PUI in the past 14 days without wearing appropriate PPE?  X   Have you been living in the same home as a person with confirmed diagnosis of COVID-19 or a PUI (household contact)?    X   Have you been diagnosed with COVID-19?    X              What to do next: Answered NO to all: Answered YES to anything:   Proceed with unit schedule Follow the [facility] Inpatient Flowsheet.

## 2019-02-02 NOTE — ED Notes (Signed)
Bed: QB16 Expected date:  Expected time:  Means of arrival:  Comments: EMS SI
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
## 2019-02-03 DIAGNOSIS — F332 Major depressive disorder, recurrent severe without psychotic features: Secondary | ICD-10-CM

## 2019-02-03 LAB — LIPID PANEL
Cholesterol: 157 mg/dL (ref 0–200)
HDL: 49 mg/dL (ref >40)
LDL Cholesterol: 99 mg/dL (ref 0–99)
Total CHOL/HDL Ratio: 3.2 RATIO
Triglycerides: 47 mg/dL (ref <150)
VLDL: 9 mg/dL (ref 0–40)

## 2019-02-03 LAB — HEMOGLOBIN A1C
Hgb A1c MFr Bld: 5.3 % (ref 4.8–5.6)
Mean Plasma Glucose: 105.41 mg/dL

## 2019-02-03 LAB — VALPROIC ACID LEVEL: Valproic Acid Lvl: 20 ug/mL — ABNORMAL LOW (ref 50.0–100.0)

## 2019-02-03 LAB — TSH: TSH: 1.556 u[IU]/mL (ref 0.350–4.500)

## 2019-02-03 MED ORDER — QUETIAPINE FUMARATE 100 MG PO TABS
100.0000 mg | ORAL_TABLET | Freq: Every day | ORAL | Status: DC
  Administered 2019-02-03: 100 mg via ORAL
  Filled 2019-02-03 (×2): qty 1
  Filled 2019-02-03: qty 7
  Filled 2019-02-03: qty 1

## 2019-02-03 MED ORDER — GABAPENTIN 300 MG PO CAPS
300.0000 mg | ORAL_CAPSULE | Freq: Three times a day (TID) | ORAL | Status: DC
  Administered 2019-02-03 – 2019-02-05 (×8): 300 mg via ORAL
  Filled 2019-02-03 (×4): qty 1
  Filled 2019-02-03: qty 21
  Filled 2019-02-03: qty 1
  Filled 2019-02-03: qty 21
  Filled 2019-02-03 (×2): qty 1
  Filled 2019-02-03: qty 21
  Filled 2019-02-03 (×3): qty 1

## 2019-02-03 NOTE — Plan of Care (Signed)
Nursing Progress Note [id number]  On initial approach, patient is seen up in the milieu. Patient presents flat, guarded and cautious during interaction. Patient fixated on meeting with MD but when prompted by writer will not elaborate on his concerns, only stating, "I have some things I need to work out with him". Patient compliant with scheduled medications. Patient reports passive SI and auditory hallucinations today but denies HI/VH.  Patient is educated about and provided medication per provider's orders. Patient safety maintained with q15 min safety checks and low fall risk precautions. Emotional support given, 1:1 interaction, and active listening provided. Patient encouraged to attend meals, groups, and work on treatment plan and goals. Labs, vital signs and patient behavior monitored throughout shift.   Patient contracts for safety with staff. Will continue to support and monitor.   Patient's self-inventory sheet Rated Energy Level  Low  Rated Sleep  Poor  Rated Appetite  Fair  Rated Anxiety (0-10)  1  Rated Hopelessness (0-10)  1  Rated Depression (0-10)  1  Daily Goal  "me"  Any Additional Comments:  "I'm still thinking to go to sleep and talking to myself all the time"     Problem: Education: Goal: Knowledge of [facility] Education information/materials will improve Outcome: Progressing   Problem: Health Behavior/Discharge Planning: Goal: Identification of resources available to assist in meeting health care needs will improve Outcome: Progressing Goal: Compliance with treatment plan for underlying cause of condition will improve Outcome: Progressing   Problem: Safety: Goal: Periods of time without injury will increase Outcome: Progressing    [place] NOVEL CORONAVIRUS (COVID-19) DAILY CHECK-OFF SYMPTOMS - answer yes or no to each - every day NO YES  Have you had a fever in the past 24 hours?  Fever (Temp > 37.80C / 100F) X   Have you had any of these  symptoms in the past 24 hours? New Cough  Sore Throat   Shortness of Breath  Difficulty Breathing  Unexplained Body Aches   X   Have you had any one of these symptoms in the past 24 hours not related to allergies?   Runny Nose  Nasal Congestion  Sneezing   X   If you have had runny nose, nasal congestion, sneezing in the past 24 hours, has it worsened?  X   EXPOSURES - check yes or no X   Have you traveled outside the state in the past 14 days?  X   Have you been in contact with someone with a confirmed diagnosis of COVID-19 or PUI in the past 14 days without wearing appropriate PPE?  X   Have you been living in the same home as a person with confirmed diagnosis of COVID-19 or a PUI (household contact)?    X   Have you been diagnosed with COVID-19?    X              What to do next: Answered NO to all: Answered YES to anything:   Proceed with unit schedule Follow the [facility] Inpatient Flowsheet.

## 2019-02-03 NOTE — Progress Notes (Signed)
Patient did not attend wrap up group because he was asleep. 
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
## 2019-02-03 NOTE — BHH Group Notes (Signed)
Pt did not attend morning group.

## 2019-02-03 NOTE — [facility] Suicide Risk Assessment (Signed)
[facility] INPATIENT:  Family/Significant Other Suicide Prevention Education  Suicide Prevention Education:  Education Completed; niece, Thomas [name] [phone] has been identified by the patient as the family member/significant other with whom the patient will be residing, and identified as the person(s) who will aid the patient in the event of a mental health crisis (suicidal ideations/suicide attempt).  With written consent from the patient, the family member/significant other has been provided the following suicide prevention education, prior to the and/or following the discharge of the patient.  The suicide prevention education provided includes the following:  Suicide risk factors  Suicide prevention and interventions  National Suicide Hotline telephone number  [facility] assessment telephone number  [facility] Emergency Assistance 911  [facility] and/or Residential Mobile Crisis Unit telephone number  Request made of family/significant other to:  Remove weapons (e.g., guns, rifles, knives), all items previously/currently identified as safety concern.    Remove drugs/medications (over-the-counter, prescriptions, illicit drugs), all items previously/currently identified as a safety concern.  The family member/significant other verbalizes understanding of the suicide prevention education information provided.  The family member/significant other agrees to remove the items of safety concern listed above.  Niece shares that patient can be withdrawn and irritable when he is off of his medications. She tries to check in with him a couple of times a week but it can be hard to reach him as patient is homeless and does not have a phone.  Darreld Mclean 02/03/2019, 11:23 AM

## 2019-02-04 LAB — PROLACTIN: Prolactin: 19.1 ng/mL — ABNORMAL HIGH (ref 4.0–15.2)

## 2019-02-04 MED ORDER — FLUPHENAZINE HCL 5 MG PO TABS
5.0000 mg | ORAL_TABLET | Freq: Three times a day (TID) | ORAL | Status: DC
  Administered 2019-02-04 – 2019-02-05 (×5): 5 mg via ORAL
  Filled 2019-02-04: qty 1
  Filled 2019-02-04: qty 21
  Filled 2019-02-04 (×4): qty 1
  Filled 2019-02-04: qty 21
  Filled 2019-02-04: qty 1
  Filled 2019-02-04: qty 21
  Filled 2019-02-04 (×2): qty 1

## 2019-02-04 MED ORDER — BENZTROPINE MESYLATE 1 MG PO TABS
1.0000 mg | ORAL_TABLET | Freq: Two times a day (BID) | ORAL | Status: DC
  Administered 2019-02-04 – 2019-02-05 (×3): 1 mg via ORAL
  Filled 2019-02-04: qty 14
  Filled 2019-02-04 (×3): qty 1
  Filled 2019-02-04: qty 14
  Filled 2019-02-04 (×3): qty 1

## 2019-02-04 NOTE — Progress Notes (Signed)
CSW spoke with patient on unit at patient request. Patient continues to voice vague thoughts of hurting himself or others if discharged in the next few days. Patient does not have a specific target or method of hurting people in mind. Of note, patient described thoughts of hurting others only when discharge was brought up in conversation.  Probable discharge tomorrow, CSW will offer patient shelter resources and outpatient follow up.  Enid Cutter, LCSW-A Clinical Social Worker

## 2019-02-04 NOTE — Progress Notes (Signed)
Recreation Therapy Notes  Date:  4.22.20 Time: 0930 Location: 300 Hall Dayroom  Group Topic: Stress Management  Goal Area(s) Addresses:  Patient will identify positive stress management techniques. Patient will identify benefits of using stress management post d/c.  Behavioral Response:  Engaged  Intervention:  Stress Management  Activity :  Guided Imagery.  LRT introduced the stress management technique of guided imagery.  LRT read a script that allowed patients to envision their peaceful place.  Patients were to listen and follow as script was read to engage in activity.  Education:  Stress Management, Discharge Planning.   Education Outcome: Acknowledges Education  Clinical Observations/Feedback:  Pt attended and participated in group.     Caroll Rancher, LRT/CTRS     Caroll Rancher A 02/04/2019 10:26 AM

## 2019-02-04 NOTE — Tx Team (Signed)
Interdisciplinary Treatment and Diagnostic Plan Update  02/04/2019 Time of Session: 10:00am Thomas ChuJames E [name] MRN: [id number]  Principal Diagnosis: <principal problem not specified>  Secondary Diagnoses: Active Problems:   MDD (major depressive disorder), recurrent episode (HCC)   Current Medications:  Current Facility-Administered Medications  Medication Dose Route Frequency Provider Last Rate Last Dose  . acetaminophen (TYLENOL) tablet 650 mg  650 mg Oral Q6H PRN Oneta RackLewis, Tanika N, NP      . alum & mag hydroxide-simeth (MAALOX/MYLANTA) 200-200-20 MG/5ML suspension 30 mL  30 mL Oral Q4H PRN Oneta RackLewis, Tanika N, NP      . benztropine (COGENTIN) tablet 1 mg  1 mg Oral BID Malvin JohnsFarah, Brian, MD   1 mg at 02/04/19 [id number]  . divalproex (DEPAKOTE ER) 24 hr tablet 250 mg  250 mg Oral BID Oneta RackLewis, Tanika N, NP   250 mg at 02/04/19 0818  . fluPHENAZine (PROLIXIN) tablet 5 mg  5 mg Oral TID Malvin JohnsFarah, Brian, MD   5 mg at 02/04/19 1339  . gabapentin (NEURONTIN) capsule 300 mg  300 mg Oral TID Malvin JohnsFarah, Brian, MD   300 mg at 02/04/19 1339  . magnesium hydroxide (MILK OF MAGNESIA) suspension 30 mL  30 mL Oral Daily PRN Oneta RackLewis, Tanika N, NP      . QUEtiapine (SEROQUEL) tablet 100 mg  100 mg Oral QHS Malvin JohnsFarah, Brian, MD   100 mg at 02/03/19 2131  . traZODone (DESYREL) tablet 50 mg  50 mg Oral QHS PRN Oneta RackLewis, Tanika N, NP   50 mg at 02/03/19 2131   PTA Medications: Medications Prior to Admission  Medication Sig Dispense Refill Last Dose  . divalproex (DEPAKOTE) 250 MG DR tablet Take 1 tablet (250 mg total) by mouth 2 (two) times daily. (Patient not taking: Reported on 02/02/2019) 60 tablet 0 Not Taking at Unknown time  . QUEtiapine (SEROQUEL) 50 MG tablet Take 1 tablet (50 mg total) by mouth at bedtime. (Patient not taking: Reported on 02/02/2019) 30 tablet 0 Not Taking at Unknown time    Patient Stressors: Medication change or noncompliance Substance abuse  Patient Strengths: Ability for insight Average or above average  intelligence Capable of independent living General fund of knowledge  Treatment Modalities: Medication Management, Group therapy, Case management,  1 to 1 session with clinician, Psychoeducation, Recreational therapy.   Physician Treatment Plan for Primary Diagnosis: <principal problem not specified> Long Term Goal(s): Improvement in symptoms so as ready for discharge Improvement in symptoms so as ready for discharge   Short Term Goals: Ability to identify changes in lifestyle to reduce recurrence of condition will improve Compliance with prescribed medications will improve  Medication Management: Evaluate patient's response, side effects, and tolerance of medication regimen.  Therapeutic Interventions: 1 to 1 sessions, Unit Group sessions and Medication administration.  Evaluation of Outcomes: Progressing  Physician Treatment Plan for Secondary Diagnosis: Active Problems:   MDD (major depressive disorder), recurrent episode (HCC)  Long Term Goal(s): Improvement in symptoms so as ready for discharge Improvement in symptoms so as ready for discharge   Short Term Goals: Ability to identify changes in lifestyle to reduce recurrence of condition will improve Compliance with prescribed medications will improve     Medication Management: Evaluate patient's response, side effects, and tolerance of medication regimen.  Therapeutic Interventions: 1 to 1 sessions, Unit Group sessions and Medication administration.  Evaluation of Outcomes: Progressing   RN Treatment Plan for Primary Diagnosis: <principal problem not specified> Long Term Goal(s): Knowledge of disease and therapeutic regimen to maintain  health will improve  Short Term Goals: Ability to verbalize frustration and anger appropriately will improve, Ability to demonstrate self-control, Ability to identify and develop effective coping behaviors will improve and Compliance with prescribed medications will improve  Medication  Management: RN will administer medications as ordered by provider, will assess and evaluate patient's response and provide education to patient for prescribed medication. RN will report any adverse and/or side effects to prescribing provider.  Therapeutic Interventions: 1 on 1 counseling sessions, Psychoeducation, Medication administration, Evaluate responses to treatment, Monitor vital signs and CBGs as ordered, Perform/monitor CIWA, COWS, AIMS and Fall Risk screenings as ordered, Perform wound care treatments as ordered.  Evaluation of Outcomes: Progressing   LCSW Treatment Plan for Primary Diagnosis: <principal problem not specified> Long Term Goal(s): Safe transition to appropriate next level of care at discharge, Engage patient in therapeutic group addressing interpersonal concerns.  Short Term Goals: Engage patient in aftercare planning with referrals and resources, Increase social support, Identify triggers associated with mental health/substance abuse issues and Increase skills for wellness and recovery  Therapeutic Interventions: Assess for all discharge needs, 1 to 1 time with Social worker, Explore available resources and support systems, Assess for adequacy in community support network, Educate family and significant other(s) on suicide prevention, Complete Psychosocial Assessment, Interpersonal group therapy.  Evaluation of Outcomes: Progressing   Progress in Treatment: Attending groups: No. Participating in groups: No. Taking medication as prescribed: Yes. Toleration medication: Yes. Family/Significant other contact made: Yes, individual(s) contacted:  niece, Ciara Patient understands diagnosis: Yes. Discussing patient identified problems/goals with staff: Yes. Medical problems stabilized or resolved: Yes. Denies suicidal/homicidal ideation: No. Voices vague SI/HI Issues/concerns per patient self-inventory: Yes.  New problem(s) identified: Yes, Describe:  homelessness,  limited supports, financial stressors  New Short Term/Long Term Goal(s): detox, medication management for mood stabilization; elimination of SI thoughts; development of comprehensive mental wellness/sobriety plan.  Patient Goals: "To get back right, stay sane."  Discharge Plan or Barriers: Likely discharging to [facility] and following up with [facility]  Reason for Continuation of Hospitalization: Aggression Anxiety Homicidal ideation Suicidal ideation  Estimated Length of Stay: 1-2 days  Attendees: Patient: Thomas [name] 02/04/2019 3:27 PM  Physician:  02/04/2019 3:27 PM  Nursing:  02/04/2019 3:27 PM  RN Care Manager: 02/04/2019 3:27 PM  Social Worker: Enid Cutter, [place] 02/04/2019 3:27 PM  Recreational Therapist:  02/04/2019 3:27 PM  Other:  02/04/2019 3:27 PM  Other:  02/04/2019 3:27 PM  Other: 02/04/2019 3:27 PM    Scribe for Treatment Team: Darreld Mclean, LCSWA 02/04/2019 3:27 PM

## 2019-02-04 NOTE — Progress Notes (Signed)
Dar Note: Patient presents with irritable affect and mood.  Reports passive SI but verbally contracts for safety.  Medication given as prescribed.  Routine safety checks maintained every 15 minutes.  Patient is safe on the unit.
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
## 2019-02-04 NOTE — Progress Notes (Signed)
D: Patient remains isolative to room.  He was starting on prolixin and is tolerating well.  Patient is a possible discharge tomorrow.  He denies SI.  No withdrawal symptoms noted.  A: Continue to monitor medication management and MD orders.  Safety checks completed every 15 minutes per protocol.  Offer support and encouragement as needed.  R: Patient is receptive to staff; his behavior is appropriate.

## 2019-02-04 NOTE — Progress Notes (Signed)
D   Pt isolated to his room   He did not come for medications  But has fallen asleep on his own    A   Verbal support given   Medications offered   Q 15 min checks R   Pt remains safe  [place] NOVEL CORONAVIRUS (COVID-19) DAILY CHECK-OFF SYMPTOMS - answer yes or no to each - every day NO YES  Have you had a fever in the past 24 hours?  . Fever (Temp > 37.80C / 100F) X   Have you had any of these symptoms in the past 24 hours? . New Cough .  Sore Throat  .  Shortness of Breath .  Difficulty Breathing .  Unexplained Body Aches   X   Have you had any one of these symptoms in the past 24 hours not related to allergies?   . Runny Nose .  Nasal Congestion .  Sneezing   X   If you have had runny nose, nasal congestion, sneezing in the past 24 hours, has it worsened?  X   EXPOSURES - check yes or no X   Have you traveled outside the state in the past 14 days?  X   Have you been in contact with someone with a confirmed diagnosis of COVID-19 or PUI in the past 14 days without wearing appropriate PPE?  X   Have you been living in the same home as a person with confirmed diagnosis of COVID-19 or a PUI (household contact)?    X   Have you been diagnosed with COVID-19?    X              What to do next: Answered NO to all: Answered YES to anything:   Proceed with unit schedule Follow the [facility] Inpatient Flowsheet.

## 2019-02-05 MED ORDER — GABAPENTIN 300 MG PO CAPS: 300.0000 mg | ORAL_CAPSULE | Freq: Three times a day (TID) | ORAL | 2 refills | Status: DC

## 2019-02-05 MED ORDER — DIVALPROEX SODIUM ER 500 MG PO TB24: 500.0000 mg | ORAL_TABLET | Freq: Two times a day (BID) | ORAL | 2 refills | Status: DC

## 2019-02-05 MED ORDER — FLUPHENAZINE HCL 5 MG PO TABS: 5.0000 mg | ORAL_TABLET | Freq: Three times a day (TID) | ORAL | 1 refills | Status: DC

## 2019-02-05 MED ORDER — DIVALPROEX SODIUM ER 500 MG PO TB24
500.0000 mg | ORAL_TABLET | Freq: Two times a day (BID) | ORAL | Status: DC
  Administered 2019-02-05: 500 mg via ORAL
  Filled 2019-02-05 (×5): qty 14

## 2019-02-05 MED ORDER — BENZTROPINE MESYLATE 1 MG PO TABS: 1.0000 mg | ORAL_TABLET | Freq: Two times a day (BID) | ORAL | 1 refills | Status: DC

## 2019-02-05 NOTE — Progress Notes (Signed)
  [facility] Adult Case Management Discharge Plan :  Will you be returning to the same living situation after discharge:  No. Going to the [facility] for shelter resources. At discharge, do you have transportation home?: Yes,  buses are free Do you have the ability to pay for your medications: No. Referred to [facility].  Release of information consent forms completed and in the chart. Letter on chart. Patient to Follow up at: Follow-up Information    [facility] Follow up on 02/10/2019.   Why:  Hospital follow up appointment is Tuesday, 4/28 at 11:00a.  The appointment will be conducted over the telephone. The provider will contact the patient.  Contact information: [address] [place] [address] [phone]           Next level of care provider has access to [facility] Link:no  Safety Planning and Suicide Prevention discussed: Yes,  with niece  Have you used any form of tobacco in the last 30 days? (Cigarettes, Smokeless Tobacco, Cigars, and/or Pipes): Yes  Has patient been referred to the Quitline?: Patient refused referral  Patient has been referred for addiction treatment: Yes  Darreld Mclean, LCSWA 02/05/2019, 9:07 AM

## 2019-02-05 NOTE — Progress Notes (Signed)
Discharge note:  Patient discharged home per MD order. Patient left for the bus station; he will follow up with [facility].  He denies any SI.  Patient left with prescriptions and samples of his medications.  Patient indicates understanding of discharge.
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
## 2019-02-05 NOTE — BHH Suicide Risk Assessment (Signed)
[facility] Discharge Suicide Risk Assessment   Principal Problem: Reports of depressive symptoms #polysubstance abuse/malingering Discharge Diagnoses: Active Problems:   MDD (major depressive disorder), recurrent episode (HCC)   Total Time spent with patient: 45 minutes  Current mental status exam alert fully oriented generally cooperative affect constricted denies wanting to harm self or others until discharge is raised and he states he still has vague thoughts of hurting others clearly malingering`  Mental Status Per Nursing Assessment::   On Admission:  Self-harm thoughts  Demographic Factors:  Male  Loss Factors: Decrease in vocational status  Historical Factors: Impulsivity  Risk Reduction Factors:   Religious beliefs about death  Continued Clinical Symptoms:  Alcohol/Substance Abuse/Dependencies  Cognitive Features That Contribute To Risk:  Polarized thinking    Suicide Risk:  Minimal: No identifiable suicidal ideation.  Patients presenting with no risk factors but with morbid ruminations; may be classified as minimal risk based on the severity of the depressive symptoms  Follow-up Information    [facility] Follow up on 02/10/2019.   Why:  Hospital follow up appointment is Tuesday, 4/28 at 11:00a.  The appointment will be conducted over the telephone. The provider will contact the patient.  Contact information: [address] [place] [address] [phone]           Plan Of Care/Follow-up recommendations:  Activity:  full  Damiel Barthold, MD 02/05/2019, 8:59 AM

## 2019-02-05 NOTE — BHH Group Notes (Signed)
LCSW Group Therapy Note  02/05/2019 2:03 PM  Type of Therapy/Topic: Group Therapy: Feelings about Diagnosis  Participation Level: Active   Description of Group:  This group will allow patients to explore their thoughts and feelings about diagnoses they have received. Patients will be guided to explore their level of understanding and acceptance of these diagnoses. Facilitator will encourage patients to process their thoughts and feelings about the reactions of others to their diagnosis and will guide patients in identifying ways to discuss their diagnosis with significant others in their lives. This group will be process-oriented, with patients participating in exploration of their own experiences, giving and receiving support, and processing challenge from other group members.  Therapeutic Goals: 1. Patient will demonstrate understanding of diagnosis as evidenced by identifying two or more symptoms of the disorder 2. Patient will be able to express two feelings regarding the diagnosis 3. Patient will demonstrate their ability to communicate their needs through discussion and/or role play  Summary of Patient Progress: Patient remained present for and participated throughout the entire group. Patient shared that a lack of supports and not having someone to express his feelings to is his greatest need. Patient was not able to identify supports or coping skills. He expressed frustration with feeling dismissed by providers.    Therapeutic Modalities:  Cognitive Behavioral Therapy Brief Therapy Feelings Identification   Enid Cutter, MSW, [facility] Clinical Social Worker

## 2019-05-04 ENCOUNTER — Encounter (HOSPITAL_COMMUNITY): Payer: Self-pay | Admitting: Emergency Medicine

## 2019-05-04 ENCOUNTER — Emergency Department (HOSPITAL_COMMUNITY)
Admission: EM | Admit: 2019-05-04 | Discharge: 2019-05-04 | Disposition: A | Discharge status: Home / Self Care | Payer: Self-pay | Attending: Emergency Medicine | Admitting: Emergency Medicine

## 2019-05-04 ENCOUNTER — Other Ambulatory Visit: Payer: Self-pay

## 2019-05-04 DIAGNOSIS — M545 Low back pain, unspecified: Secondary | ICD-10-CM

## 2019-05-04 DIAGNOSIS — Z79899 Other long term (current) drug therapy: Secondary | ICD-10-CM | POA: Insufficient documentation

## 2019-05-04 DIAGNOSIS — F17210 Nicotine dependence, cigarettes, uncomplicated: Secondary | ICD-10-CM | POA: Insufficient documentation

## 2019-05-04 DIAGNOSIS — Z760 Encounter for issue of repeat prescription: Secondary | ICD-10-CM | POA: Insufficient documentation

## 2019-05-04 MED ORDER — LIDOCAINE 5 % EX PTCH: 1.0000 | MEDICATED_PATCH | CUTANEOUS | 0 refills | Status: DC

## 2019-05-04 MED ORDER — NAPROXEN 500 MG PO TABS
500.0000 mg | ORAL_TABLET | Freq: Once | ORAL | Status: AC
  Administered 2019-05-04: 500 mg via ORAL
  Filled 2019-05-04: qty 1

## 2019-05-04 MED ORDER — LIDOCAINE 5 % EX PTCH
1.0000 | MEDICATED_PATCH | CUTANEOUS | Status: DC
  Administered 2019-05-04: 21:00:00 1 via TRANSDERMAL
  Filled 2019-05-04: qty 1

## 2019-05-04 MED ORDER — NAPROXEN 500 MG PO TABS: 500.0000 mg | ORAL_TABLET | Freq: Two times a day (BID) | ORAL | 0 refills | Status: DC

## 2019-05-04 NOTE — Discharge Instructions (Signed)
Take naproxen 2 times a day with meals.  Do not take other anti-inflammatories at the same time (Advil, Motrin, ibuprofen, Aleve). You may supplement with Tylenol if you need further pain control. Use muscle creams (bengay, icy hot, salonpas) as needed for pain.  Use lidocaine patches for pain.  Use heat/ice to help with pain.  It is very important that you follow-up with [facility] for refill of your behavioral health medications. Return to the ER if you develop high fevers, numbness, loss of bowel or bladder control, or any new or concerning symptoms.

## 2019-05-04 NOTE — ED Triage Notes (Signed)
Patient here from home with complaints of mid to lower back pain that started 1 week ago because he's "getting old". Also states that he needs a refill on psychiatric medication, Ativan, Depakote, Seroquel, and Trazodone.
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
## 2019-09-05 ENCOUNTER — Encounter (HOSPITAL_COMMUNITY): Payer: Self-pay

## 2019-09-05 ENCOUNTER — Emergency Department (HOSPITAL_COMMUNITY): Payer: Self-pay

## 2019-09-05 ENCOUNTER — Emergency Department (HOSPITAL_COMMUNITY)
Admission: EM | Admit: 2019-09-05 | Discharge: 2019-09-06 | Disposition: A | Discharge status: Home / Self Care | Payer: Self-pay | Attending: Emergency Medicine | Admitting: Emergency Medicine

## 2019-09-05 ENCOUNTER — Other Ambulatory Visit: Payer: Self-pay

## 2019-09-05 DIAGNOSIS — R0789 Other chest pain: Secondary | ICD-10-CM | POA: Insufficient documentation

## 2019-09-05 DIAGNOSIS — R202 Paresthesia of skin: Secondary | ICD-10-CM | POA: Insufficient documentation

## 2019-09-05 DIAGNOSIS — F1010 Alcohol abuse, uncomplicated: Secondary | ICD-10-CM | POA: Insufficient documentation

## 2019-09-05 DIAGNOSIS — M549 Dorsalgia, unspecified: Secondary | ICD-10-CM | POA: Diagnosis not present

## 2019-09-05 DIAGNOSIS — R109 Unspecified abdominal pain: Secondary | ICD-10-CM | POA: Diagnosis not present

## 2019-09-05 DIAGNOSIS — F17210 Nicotine dependence, cigarettes, uncomplicated: Secondary | ICD-10-CM | POA: Diagnosis not present

## 2019-09-05 DIAGNOSIS — R55 Syncope and collapse: Secondary | ICD-10-CM | POA: Insufficient documentation

## 2019-09-05 DIAGNOSIS — T1490XA Injury, unspecified, initial encounter: Secondary | ICD-10-CM

## 2019-09-05 DIAGNOSIS — R52 Pain, unspecified: Secondary | ICD-10-CM

## 2019-09-05 DIAGNOSIS — F1410 Cocaine abuse, uncomplicated: Secondary | ICD-10-CM | POA: Insufficient documentation

## 2019-09-05 HISTORY — DX: Alcohol abuse, uncomplicated: F10.10

## 2019-09-05 HISTORY — DX: Bipolar disorder, unspecified: F31.9

## 2019-09-05 HISTORY — DX: Cocaine abuse, uncomplicated: F14.10

## 2019-09-05 LAB — COMPREHENSIVE METABOLIC PANEL
ALT: 23 U/L (ref 0–44)
AST: 28 U/L (ref 15–41)
Albumin: 3.6 g/dL (ref 3.5–5.0)
Alkaline Phosphatase: 61 U/L (ref 38–126)
Anion gap: 13 (ref 5–15)
BUN: 12 mg/dL (ref 6–20)
CO2: 23 mmol/L (ref 22–32)
Calcium: 8.7 mg/dL — ABNORMAL LOW (ref 8.9–10.3)
Chloride: 100 mmol/L (ref 98–111)
Creatinine, Ser: 1.2 mg/dL (ref 0.61–1.24)
GFR calc Af Amer: >60 mL/min (ref >60)
GFR calc non Af Amer: >60 mL/min (ref >60)
Glucose, Bld: 95 mg/dL (ref 70–99)
Potassium: 3.9 mmol/L (ref 3.5–5.1)
Sodium: 136 mmol/L (ref 135–145)
Total Bilirubin: 0.7 mg/dL (ref 0.3–1.2)
Total Protein: 7.5 g/dL (ref 6.5–8.1)

## 2019-09-05 LAB — CBC
HCT: 42.8 % (ref 39.0–52.0)
Hemoglobin: 14.4 g/dL (ref 13.0–17.0)
MCH: 31.2 pg (ref 26.0–34.0)
MCHC: 33.6 g/dL (ref 30.0–36.0)
MCV: 92.6 fL (ref 80.0–100.0)
Platelets: 304 10*3/uL (ref 150–400)
RBC: 4.62 MIL/uL (ref 4.22–5.81)
RDW: 13.9 % (ref 11.5–15.5)
WBC: 6.2 10*3/uL (ref 4.0–10.5)
nRBC: 0 % (ref 0.0–0.2)

## 2019-09-05 LAB — ETHANOL: Alcohol, Ethyl (B): 143 mg/dL — ABNORMAL HIGH (ref <10)

## 2019-09-05 LAB — SAMPLE TO BLOOD BANK

## 2019-09-05 LAB — I-STAT CHEM 8, ED
BUN: 14 mg/dL (ref 6–20)
Calcium, Ion: 1.04 mmol/L — ABNORMAL LOW (ref 1.15–1.40)
Chloride: 100 mmol/L (ref 98–111)
Creatinine, Ser: 1.3 mg/dL — ABNORMAL HIGH (ref 0.61–1.24)
Glucose, Bld: 89 mg/dL (ref 70–99)
HCT: 47 % (ref 39.0–52.0)
Hemoglobin: 16 g/dL (ref 13.0–17.0)
Potassium: 4 mmol/L (ref 3.5–5.1)
Sodium: 139 mmol/L (ref 135–145)
TCO2: 26 mmol/L (ref 22–32)

## 2019-09-05 LAB — PROTIME-INR
INR: 1 (ref 0.8–1.2)
Prothrombin Time: 12.5 seconds (ref 11.4–15.2)

## 2019-09-05 LAB — LACTIC ACID, PLASMA: Lactic Acid, Venous: 2.2 mmol/L (ref 0.5–1.9)

## 2019-09-05 MED ORDER — THIAMINE HCL 100 MG/ML IJ SOLN: 100.0000 mg | Freq: Every day | INTRAMUSCULAR | Status: DC

## 2019-09-05 MED ORDER — SODIUM CHLORIDE 0.9 % IV BOLUS
1000.0000 mL | Freq: Once | INTRAVENOUS | Status: AC
  Administered 2019-09-05: 20:00:00 1000 mL via INTRAVENOUS

## 2019-09-05 MED ORDER — LORAZEPAM 2 MG/ML IJ SOLN
0.0000 mg | Freq: Four times a day (QID) | INTRAMUSCULAR | Status: DC
  Administered 2019-09-06: 2 mg via INTRAVENOUS
  Filled 2019-09-05: qty 1

## 2019-09-05 MED ORDER — LORAZEPAM 1 MG PO TABS: 0.0000 mg | ORAL_TABLET | Freq: Four times a day (QID) | ORAL | Status: DC

## 2019-09-05 MED ORDER — LORAZEPAM 2 MG/ML IJ SOLN: 0.0000 mg | Freq: Two times a day (BID) | INTRAMUSCULAR | Status: DC

## 2019-09-05 MED ORDER — VITAMIN B-1 100 MG PO TABS: 100.0000 mg | ORAL_TABLET | Freq: Every day | ORAL | Status: DC

## 2019-09-05 MED ORDER — LORAZEPAM 1 MG PO TABS: 0.0000 mg | ORAL_TABLET | Freq: Two times a day (BID) | ORAL | Status: DC

## 2019-09-05 MED ORDER — FENTANYL CITRATE (PF) 100 MCG/2ML IJ SOLN
25.0000 ug | Freq: Once | INTRAMUSCULAR | Status: AC
  Administered 2019-09-05: 20:00:00 25 ug via INTRAVENOUS
  Filled 2019-09-05: qty 2

## 2019-09-05 MED ORDER — SODIUM CHLORIDE 0.9 % IV BOLUS
1000.0000 mL | Freq: Once | INTRAVENOUS | Status: AC
  Administered 2019-09-06: 1000 mL via INTRAVENOUS

## 2019-09-05 MED ORDER — IOHEXOL 300 MG/ML  SOLN
100.0000 mL | Freq: Once | INTRAMUSCULAR | Status: AC | PRN
  Administered 2019-09-05: 100 mL via INTRAVENOUS
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
## 2019-09-05 NOTE — ED Provider Notes (Signed)
ATTENDING SUPERVISORY NOTE I have personally viewed the imaging studies performed. I have personally seen and examined the patient, and discussed the plan of care with the resident.  I have reviewed the documentation of the resident and agree.  No diagnosis found.  .Critical Care Performed by: Elnora Morrison, MD Authorized by: Elnora Morrison, MD   Critical care provider statement:    Critical care time (minutes):  35   Critical care start time:  09/05/2019 7:50 PM   Critical care end time:  09/05/2019 8:25 PM   Critical care time was exclusive of:  Separately billable procedures and treating other patients and teaching time   Critical care was necessary to treat or prevent imminent or life-threatening deterioration of the following conditions:  Trauma   Critical care was time spent personally by me on the following activities:  Examination of patient, ordering and performing treatments and interventions, ordering and review of laboratory studies, ordering and review of radiographic studies, pulse oximetry and re-evaluation of patient's condition      Elnora Morrison, MD 09/06/19 2344
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
## 2019-09-05 NOTE — ED Notes (Signed)
Dr. Reather Converse notified of lactic acid of 2.2.
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
## 2019-09-06 ENCOUNTER — Emergency Department (HOSPITAL_COMMUNITY): Payer: Self-pay

## 2019-09-06 LAB — CDS SEROLOGY

## 2019-09-06 MED ORDER — DEXAMETHASONE 6 MG PO TABS: 6.0000 mg | ORAL_TABLET | Freq: Two times a day (BID) | ORAL | 0 refills | Status: AC

## 2019-09-06 MED ORDER — DEXAMETHASONE SODIUM PHOSPHATE 10 MG/ML IJ SOLN
10.0000 mg | Freq: Once | INTRAMUSCULAR | Status: AC
  Administered 2019-09-06: 05:00:00 10 mg via INTRAVENOUS
  Filled 2019-09-06: qty 1

## 2019-09-06 NOTE — ED Notes (Signed)
Pt ambulated to restroom and dress himself without problems.

## 2019-09-06 NOTE — ED Notes (Signed)
Patient transported to MRI 

## 2019-09-06 NOTE — ED Notes (Signed)
Discharge instructions reviewed with pt. Pt verbalized understanding.   
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
## 2019-09-07 ENCOUNTER — Encounter (HOSPITAL_COMMUNITY): Payer: Self-pay | Admitting: Emergency Medicine

## 2021-07-10 ENCOUNTER — Emergency Department (HOSPITAL_COMMUNITY): Payer: Self-pay

## 2021-07-10 ENCOUNTER — Other Ambulatory Visit: Payer: Self-pay

## 2021-07-10 ENCOUNTER — Inpatient Hospital Stay (HOSPITAL_COMMUNITY): Payer: Self-pay

## 2021-07-10 ENCOUNTER — Inpatient Hospital Stay (HOSPITAL_COMMUNITY)
Admission: EM | Admit: 2021-07-10 | Discharge: 2021-08-19 | DRG: 329 | Disposition: A | Discharge status: Home / Self Care | Payer: Self-pay | Attending: Surgery | Admitting: Surgery

## 2021-07-10 DIAGNOSIS — K631 Perforation of intestine (nontraumatic): Secondary | ICD-10-CM | POA: Diagnosis not present

## 2021-07-10 DIAGNOSIS — E43 Unspecified severe protein-calorie malnutrition: Secondary | ICD-10-CM | POA: Insufficient documentation

## 2021-07-10 DIAGNOSIS — N179 Acute kidney failure, unspecified: Secondary | ICD-10-CM | POA: Diagnosis present

## 2021-07-10 DIAGNOSIS — Z20822 Contact with and (suspected) exposure to covid-19: Secondary | ICD-10-CM | POA: Diagnosis present

## 2021-07-10 DIAGNOSIS — K567 Ileus, unspecified: Secondary | ICD-10-CM | POA: Diagnosis not present

## 2021-07-10 DIAGNOSIS — F3132 Bipolar disorder, current episode depressed, moderate: Secondary | ICD-10-CM

## 2021-07-10 DIAGNOSIS — R45850 Homicidal ideations: Secondary | ICD-10-CM | POA: Diagnosis not present

## 2021-07-10 DIAGNOSIS — F3130 Bipolar disorder, current episode depressed, mild or moderate severity, unspecified: Secondary | ICD-10-CM | POA: Diagnosis present

## 2021-07-10 DIAGNOSIS — F17210 Nicotine dependence, cigarettes, uncomplicated: Secondary | ICD-10-CM | POA: Diagnosis present

## 2021-07-10 DIAGNOSIS — K9181 Other intraoperative complications of digestive system: Secondary | ICD-10-CM | POA: Diagnosis not present

## 2021-07-10 DIAGNOSIS — Z79899 Other long term (current) drug therapy: Secondary | ICD-10-CM

## 2021-07-10 DIAGNOSIS — F1910 Other psychoactive substance abuse, uncomplicated: Secondary | ICD-10-CM | POA: Diagnosis present

## 2021-07-10 DIAGNOSIS — K5651 Intestinal adhesions [bands], with partial obstruction: Principal | ICD-10-CM | POA: Diagnosis present

## 2021-07-10 DIAGNOSIS — K651 Peritoneal abscess: Secondary | ICD-10-CM | POA: Diagnosis not present

## 2021-07-10 DIAGNOSIS — R300 Dysuria: Secondary | ICD-10-CM | POA: Diagnosis not present

## 2021-07-10 DIAGNOSIS — R112 Nausea with vomiting, unspecified: Secondary | ICD-10-CM

## 2021-07-10 DIAGNOSIS — T8143XA Infection following a procedure, organ and space surgical site, initial encounter: Secondary | ICD-10-CM | POA: Diagnosis not present

## 2021-07-10 DIAGNOSIS — R509 Fever, unspecified: Secondary | ICD-10-CM

## 2021-07-10 DIAGNOSIS — R159 Full incontinence of feces: Secondary | ICD-10-CM | POA: Diagnosis not present

## 2021-07-10 DIAGNOSIS — R64 Cachexia: Secondary | ICD-10-CM | POA: Diagnosis present

## 2021-07-10 DIAGNOSIS — Z681 Body mass index (BMI) 19 or less, adult: Secondary | ICD-10-CM

## 2021-07-10 DIAGNOSIS — Z5900 Homelessness unspecified: Secondary | ICD-10-CM

## 2021-07-10 DIAGNOSIS — G4700 Insomnia, unspecified: Secondary | ICD-10-CM | POA: Diagnosis not present

## 2021-07-10 DIAGNOSIS — Z4659 Encounter for fitting and adjustment of other gastrointestinal appliance and device: Secondary | ICD-10-CM

## 2021-07-10 DIAGNOSIS — Z9114 Patient's other noncompliance with medication regimen: Secondary | ICD-10-CM

## 2021-07-10 DIAGNOSIS — Y838 Other surgical procedures as the cause of abnormal reaction of the patient, or of later complication, without mention of misadventure at the time of the procedure: Secondary | ICD-10-CM | POA: Diagnosis not present

## 2021-07-10 DIAGNOSIS — B9620 Unspecified Escherichia coli [E. coli] as the cause of diseases classified elsewhere: Secondary | ICD-10-CM | POA: Diagnosis not present

## 2021-07-10 DIAGNOSIS — K632 Fistula of intestine: Secondary | ICD-10-CM | POA: Diagnosis not present

## 2021-07-10 DIAGNOSIS — E860 Dehydration: Secondary | ICD-10-CM | POA: Diagnosis present

## 2021-07-10 DIAGNOSIS — K56609 Unspecified intestinal obstruction, unspecified as to partial versus complete obstruction: Secondary | ICD-10-CM | POA: Diagnosis present

## 2021-07-10 DIAGNOSIS — Z1611 Resistance to penicillins: Secondary | ICD-10-CM | POA: Diagnosis not present

## 2021-07-10 LAB — CBC WITH DIFFERENTIAL/PLATELET
Abs Immature Granulocytes: 0.03 10*3/uL (ref 0.00–0.07)
Basophils Absolute: 0.1 10*3/uL (ref 0.0–0.1)
Basophils Relative: 1 %
Eosinophils Absolute: 0 10*3/uL (ref 0.0–0.5)
Eosinophils Relative: 0 %
HCT: 50.7 % (ref 39.0–52.0)
Hemoglobin: 16.7 g/dL (ref 13.0–17.0)
Immature Granulocytes: 0 %
Lymphocytes Relative: 19 %
Lymphs Abs: 1.4 10*3/uL (ref 0.7–4.0)
MCH: 29.9 pg (ref 26.0–34.0)
MCHC: 32.9 g/dL (ref 30.0–36.0)
MCV: 90.9 fL (ref 80.0–100.0)
Monocytes Absolute: 0.6 10*3/uL (ref 0.1–1.0)
Monocytes Relative: 8 %
Neutro Abs: 5.4 10*3/uL (ref 1.7–7.7)
Neutrophils Relative %: 72 %
Platelets: 384 10*3/uL (ref 150–400)
RBC: 5.58 MIL/uL (ref 4.22–5.81)
RDW: 13.4 % (ref 11.5–15.5)
WBC: 7.5 10*3/uL (ref 4.0–10.5)
nRBC: 0 % (ref 0.0–0.2)

## 2021-07-10 LAB — COMPREHENSIVE METABOLIC PANEL
ALT: 17 U/L (ref 0–44)
AST: 28 U/L (ref 15–41)
Albumin: 4.1 g/dL (ref 3.5–5.0)
Alkaline Phosphatase: 83 U/L (ref 38–126)
Anion gap: 16 — ABNORMAL HIGH (ref 5–15)
BUN: 27 mg/dL — ABNORMAL HIGH (ref 6–20)
CO2: 26 mmol/L (ref 22–32)
Calcium: 9.5 mg/dL (ref 8.9–10.3)
Chloride: 95 mmol/L — ABNORMAL LOW (ref 98–111)
Creatinine, Ser: 1.51 mg/dL — ABNORMAL HIGH (ref 0.61–1.24)
GFR, Estimated: 56 mL/min — ABNORMAL LOW (ref >60)
Glucose, Bld: 121 mg/dL — ABNORMAL HIGH (ref 70–99)
Potassium: 4 mmol/L (ref 3.5–5.1)
Sodium: 137 mmol/L (ref 135–145)
Total Bilirubin: 1.1 mg/dL (ref 0.3–1.2)
Total Protein: 8.7 g/dL — ABNORMAL HIGH (ref 6.5–8.1)

## 2021-07-10 LAB — LACTIC ACID, PLASMA
Lactic Acid, Venous: 1.6 mmol/L (ref 0.5–1.9)
Lactic Acid, Venous: 1.8 mmol/L (ref 0.5–1.9)

## 2021-07-10 LAB — HIV ANTIBODY (ROUTINE TESTING W REFLEX): HIV Screen 4th Generation wRfx: NONREACTIVE

## 2021-07-10 LAB — VALPROIC ACID LEVEL: Valproic Acid Lvl: <10 ug/mL — ABNORMAL LOW (ref 50.0–100.0)

## 2021-07-10 LAB — LIPASE, BLOOD: Lipase: 40 U/L (ref 11–51)

## 2021-07-10 MED ORDER — GABAPENTIN 300 MG PO CAPS: 300.0000 mg | ORAL_CAPSULE | Freq: Three times a day (TID) | ORAL | Status: DC

## 2021-07-10 MED ORDER — SODIUM CHLORIDE 0.9 % IV BOLUS
1000.0000 mL | Freq: Once | INTRAVENOUS | Status: AC
Start: 2021-07-10 — End: 2021-07-10
  Administered 2021-07-10: 1000 mL via INTRAVENOUS

## 2021-07-10 MED ORDER — DIVALPROEX SODIUM ER 500 MG PO TB24: 500.0000 mg | ORAL_TABLET | Freq: Every day | ORAL | Status: DC

## 2021-07-10 MED ORDER — PHENOL 1.4 % MT LIQD
2.0000 | OROMUCOSAL | Status: DC | PRN
  Administered 2021-07-10: 2 via OROMUCOSAL
  Filled 2021-07-10 (×2): qty 177

## 2021-07-10 MED ORDER — DIPHENHYDRAMINE HCL 25 MG PO CAPS: 25.0000 mg | ORAL_CAPSULE | Freq: Four times a day (QID) | ORAL | Status: DC | PRN

## 2021-07-10 MED ORDER — ONDANSETRON 4 MG PO TBDP: 4.0000 mg | ORAL_TABLET | Freq: Four times a day (QID) | ORAL | Status: DC | PRN

## 2021-07-10 MED ORDER — IOHEXOL 300 MG/ML  SOLN
100.0000 mL | Freq: Once | INTRAMUSCULAR | Status: AC | PRN
  Administered 2021-07-10: 100 mL via INTRAVENOUS

## 2021-07-10 MED ORDER — HYDROMORPHONE HCL 1 MG/ML IJ SOLN: 1.0000 mg | Freq: Once | INTRAMUSCULAR | Status: DC

## 2021-07-10 MED ORDER — ACETAMINOPHEN 650 MG RE SUPP: 650.0000 mg | Freq: Four times a day (QID) | RECTAL | Status: DC | PRN

## 2021-07-10 MED ORDER — DIPHENHYDRAMINE HCL 50 MG/ML IJ SOLN
25.0000 mg | Freq: Four times a day (QID) | INTRAMUSCULAR | Status: DC | PRN
  Administered 2021-07-12 – 2021-07-13 (×2): 25 mg via INTRAVENOUS
  Filled 2021-07-10 (×2): qty 1

## 2021-07-10 MED ORDER — METOPROLOL TARTRATE 5 MG/5ML IV SOLN
5.0000 mg | Freq: Four times a day (QID) | INTRAVENOUS | Status: DC | PRN
Start: 2021-07-10 — End: 2021-08-19

## 2021-07-10 MED ORDER — KCL IN DEXTROSE-NACL 10-5-0.45 MEQ/L-%-% IV SOLN
INTRAVENOUS | Status: DC
  Filled 2021-07-10 (×6): qty 1000

## 2021-07-10 MED ORDER — MORPHINE SULFATE (PF) 2 MG/ML IV SOLN
2.0000 mg | INTRAVENOUS | Status: DC | PRN
  Administered 2021-07-10 – 2021-07-13 (×12): 2 mg via INTRAVENOUS
  Filled 2021-07-10 (×12): qty 1

## 2021-07-10 MED ORDER — ONDANSETRON HCL 4 MG/2ML IJ SOLN
4.0000 mg | Freq: Four times a day (QID) | INTRAMUSCULAR | Status: DC | PRN
  Administered 2021-07-11 – 2021-07-22 (×4): 4 mg via INTRAVENOUS
  Filled 2021-07-10 (×4): qty 2

## 2021-07-10 MED ORDER — ONDANSETRON HCL 4 MG/2ML IJ SOLN
4.0000 mg | Freq: Once | INTRAMUSCULAR | Status: AC
Start: 2021-07-10 — End: 2021-07-10
  Administered 2021-07-10: 4 mg via INTRAVENOUS
  Filled 2021-07-10: qty 2

## 2021-07-10 MED ORDER — ENOXAPARIN SODIUM 40 MG/0.4ML IJ SOSY
40.0000 mg | PREFILLED_SYRINGE | INTRAMUSCULAR | Status: DC
  Filled 2021-07-10: qty 0.4

## 2021-07-10 NOTE — ED Notes (Signed)
Dr Sheliah Hatch at bedside, placed 16Fr NG tube to R nare

## 2021-07-10 NOTE — ED Triage Notes (Signed)
Pt with central abdominal pain and n/v x 3 days. Emesis x 15 in the last 24 hours. Denies diarrhea. Also reports being out of his rx for Seroquel, Depakote, and Ativan.

## 2021-07-10 NOTE — ED Notes (Signed)
RN advanced NG tube, xray called for placement verification
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
## 2021-07-10 NOTE — ED Notes (Signed)
WENT TO TAKE PATIENT VITALS PATIENT STARTED THREATENING ME TELLING ME TO STOP LOOKING AT HIM WHICH ALL IS WAS DOING WAS UP DATING HIS VITALS

## 2021-07-10 NOTE — ED Provider Notes (Signed)
Emergency Medicine Provider Triage Evaluation Note  Thomas [name] , a 50 y.o. male  was evaluated in triage.  Pt complains of nausea/vomiting + abd pain for 3 days.  States that he vomits anytime he tries to eat.  States that his vomit has been darker but denies any blood in his vomit.  He reports that he is out of his antipsychotics for several months.  He denies any diarrhea fevers or chills.  He has not been able to tolerate water and states he feels quite dehydrated  No recent travel Denies chest pain Decreased urine (twice daily last two days)  Review of Systems  Positive: Abd pain, NV Negative: fever  Physical Exam  BP 110/85 (BP Location: Right Arm)   Pulse (!) 120   Temp 98 F (36.7 C) (Oral)   Resp 16   SpO2 97%  Gen:   Awake, uncomfortable, dry oral mucosa Resp:  Normal effort MSK:   Moves extremities without difficulty  Other:  No rebound TTP but is diffusely TTP. Difficult to examine in triage as pt does not wish to be reclined in triage chair.   Medical Decision Making  Medically screening exam initiated at 10:00 AM.  Appropriate orders placed.  NAPOLEON MONACELLI was informed that the remainder of the evaluation will be completed by another provider, this initial triage assessment does not replace that evaluation, and the importance of remaining in the ED until their evaluation is complete.  Abdomen seems to be quite tender.  He is tachycardic and unwell appearing Will obtain acute abdominal x-ray and place order for CT abdomen pelvis with contrast although kidney function will need to be assessed first.  He seems to be having decreased urine output, concern for AKI.   Solon Augusta [place], [place] 07/10/21 1003    Milagros Loll, MD 07/10/21 [phone]

## 2021-07-11 ENCOUNTER — Inpatient Hospital Stay (HOSPITAL_COMMUNITY): Payer: Self-pay

## 2021-07-11 ENCOUNTER — Encounter (HOSPITAL_COMMUNITY): Payer: Self-pay

## 2021-07-11 LAB — TYPE AND SCREEN
ABO/RH(D): A POS
Antibody Screen: NEGATIVE

## 2021-07-11 LAB — BASIC METABOLIC PANEL
Anion gap: 9 (ref 5–15)
BUN: 21 mg/dL — ABNORMAL HIGH (ref 6–20)
CO2: 29 mmol/L (ref 22–32)
Calcium: 8.9 mg/dL (ref 8.9–10.3)
Chloride: 96 mmol/L — ABNORMAL LOW (ref 98–111)
Creatinine, Ser: 1.12 mg/dL (ref 0.61–1.24)
GFR, Estimated: >60 mL/min (ref >60)
Glucose, Bld: 129 mg/dL — ABNORMAL HIGH (ref 70–99)
Potassium: 3.5 mmol/L (ref 3.5–5.1)
Sodium: 134 mmol/L — ABNORMAL LOW (ref 135–145)

## 2021-07-11 LAB — CBC
HCT: 47.4 % (ref 39.0–52.0)
Hemoglobin: 15.6 g/dL (ref 13.0–17.0)
MCH: 29.9 pg (ref 26.0–34.0)
MCHC: 32.9 g/dL (ref 30.0–36.0)
MCV: 90.8 fL (ref 80.0–100.0)
Platelets: 332 10*3/uL (ref 150–400)
RBC: 5.22 MIL/uL (ref 4.22–5.81)
RDW: 13.4 % (ref 11.5–15.5)
WBC: 5.3 10*3/uL (ref 4.0–10.5)
nRBC: 0 % (ref 0.0–0.2)

## 2021-07-11 LAB — URINALYSIS, ROUTINE W REFLEX MICROSCOPIC
Bacteria, UA: NONE SEEN
Bilirubin Urine: NEGATIVE
Glucose, UA: NEGATIVE mg/dL
Ketones, ur: 5 mg/dL — AB
Leukocytes,Ua: NEGATIVE
Nitrite: NEGATIVE
Protein, ur: 100 mg/dL — AB
Specific Gravity, Urine: 1.042 — ABNORMAL HIGH (ref 1.005–1.030)
pH: 6 (ref 5.0–8.0)

## 2021-07-11 LAB — RAPID URINE DRUG SCREEN, HOSP PERFORMED
Amphetamines: NOT DETECTED
Barbiturates: NOT DETECTED
Benzodiazepines: NOT DETECTED
Cocaine: POSITIVE — AB
Opiates: POSITIVE — AB
Tetrahydrocannabinol: POSITIVE — AB

## 2021-07-11 LAB — RESP PANEL BY RT-PCR (FLU A&B, COVID) ARPGX2
Influenza A by PCR: NEGATIVE
Influenza B by PCR: NEGATIVE
SARS Coronavirus 2 by RT PCR: NEGATIVE

## 2021-07-11 LAB — ABO/RH: ABO/RH(D): A POS

## 2021-07-11 MED ORDER — LIDOCAINE HCL (PF) 2 % IJ SOLN
INTRAMUSCULAR | Status: AC
  Filled 2021-07-11: qty 20

## 2021-07-11 MED ORDER — PHENYLEPHRINE 40 MCG/ML (10ML) SYRINGE FOR IV PUSH (FOR BLOOD PRESSURE SUPPORT)
PREFILLED_SYRINGE | INTRAVENOUS | Status: AC
  Filled 2021-07-11: qty 30

## 2021-07-11 MED ORDER — ORAL CARE MOUTH RINSE: 15.0000 mL | Freq: Once | OROMUCOSAL | Status: AC

## 2021-07-11 MED ORDER — DEXAMETHASONE SODIUM PHOSPHATE 10 MG/ML IJ SOLN
INTRAMUSCULAR | Status: AC
  Filled 2021-07-11: qty 1

## 2021-07-11 MED ORDER — ROCURONIUM BROMIDE 10 MG/ML (PF) SYRINGE
PREFILLED_SYRINGE | INTRAVENOUS | Status: AC
  Filled 2021-07-11: qty 10

## 2021-07-11 MED ORDER — ONDANSETRON HCL 4 MG/2ML IJ SOLN
INTRAMUSCULAR | Status: AC
  Filled 2021-07-11: qty 2

## 2021-07-11 MED ORDER — CHLORHEXIDINE GLUCONATE 0.12 % MT SOLN
15.0000 mL | Freq: Once | OROMUCOSAL | Status: AC
  Administered 2021-07-11: 15 mL via OROMUCOSAL
  Filled 2021-07-11: qty 15

## 2021-07-11 MED ORDER — SUCCINYLCHOLINE CHLORIDE 200 MG/10ML IV SOSY
PREFILLED_SYRINGE | INTRAVENOUS | Status: AC
  Filled 2021-07-11: qty 10

## 2021-07-11 MED ORDER — PROPOFOL 10 MG/ML IV BOLUS
INTRAVENOUS | Status: AC
  Filled 2021-07-11: qty 20

## 2021-07-11 MED ORDER — LACTATED RINGERS IV SOLN: INTRAVENOUS | Status: DC

## 2021-07-11 NOTE — Progress Notes (Addendum)
First attempt to call report to Tamisha, RN 6N 17C.
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
## 2021-07-11 NOTE — Progress Notes (Signed)
Report given to Tamisha, RN 6N 17C. AAOX4. Pt in no apparent distress or pain. The opportunity to ask questions was provided. Awaiting transport.

## 2021-07-11 NOTE — Plan of Care (Signed)

## 2021-07-11 NOTE — Anesthesia Preprocedure Evaluation (Addendum)
Anesthesia Evaluation  Patient identified by MRN, date of birth, ID band Patient awake    Reviewed: Allergy & Precautions, NPO status , Patient's Chart, lab work & pertinent test results  Airway Mallampati: III  TM Distance: >3 FB Neck ROM: Full    Dental  (+) Poor Dentition, Dental Advisory Given   Pulmonary Current Smoker and Patient abstained from smoking.,    Pulmonary exam normal breath sounds clear to auscultation       Cardiovascular negative cardio ROS Normal cardiovascular exam Rhythm:Regular Rate:Normal     Neuro/Psych PSYCHIATRIC DISORDERS Depression Bipolar Disorder negative neurological ROS     GI/Hepatic (+)     substance abuse  alcohol use and cocaine use, Small bowel obstruction   Endo/Other  negative endocrine ROS  Renal/GU negative Renal ROS  negative genitourinary   Musculoskeletal negative musculoskeletal ROS (+)   Abdominal   Peds  Hematology negative hematology ROS (+)   Anesthesia Other Findings   Reproductive/Obstetrics                            Anesthesia Physical Anesthesia Plan  ASA: 2  Anesthesia Plan: General   Post-op Pain Management:    Induction: Intravenous and Rapid sequence  PONV Risk Score and Plan: 3 and Treatment may vary due to age or medical condition, Midazolam, Ondansetron and Dexamethasone  Airway Management Planned: Oral ETT  Additional Equipment: None  Intra-op Plan:   Post-operative Plan: Extubation in OR  Informed Consent: I have reviewed the patients History and Physical, chart, labs and discussed the procedure including the risks, benefits and alternatives for the proposed anesthesia with the patient or authorized representative who has indicated his/her understanding and acceptance.     Dental advisory given  Plan Discussed with: CRNA and Anesthesiologist  Anesthesia Plan Comments:        Anesthesia Quick  Evaluation

## 2021-07-11 NOTE — ED Notes (Signed)
XR at bedside

## 2021-07-12 ENCOUNTER — Encounter (HOSPITAL_COMMUNITY): Payer: Self-pay

## 2021-07-12 ENCOUNTER — Inpatient Hospital Stay (HOSPITAL_COMMUNITY): Payer: Self-pay | Admitting: Certified Registered Nurse Anesthetist

## 2021-07-12 ENCOUNTER — Encounter (HOSPITAL_COMMUNITY): Admission: EM | Disposition: A | Discharge status: Home / Self Care | Payer: Self-pay | Source: Home / Self Care

## 2021-07-12 ENCOUNTER — Inpatient Hospital Stay (HOSPITAL_COMMUNITY): Payer: Self-pay

## 2021-07-12 HISTORY — PX: LYSIS OF ADHESION: SHX5961

## 2021-07-12 HISTORY — PX: LAPAROTOMY: SHX154

## 2021-07-12 LAB — SURGICAL PCR SCREEN
MRSA, PCR: NEGATIVE
Staphylococcus aureus: NEGATIVE

## 2021-07-12 SURGERY — LAPAROTOMY, EXPLORATORY
Anesthesia: General | Site: Abdomen

## 2021-07-12 MED ORDER — CEFAZOLIN SODIUM-DEXTROSE 2-4 GM/100ML-% IV SOLN
2.0000 g | INTRAVENOUS | Status: AC
  Administered 2021-07-12: 2 g via INTRAVENOUS
  Filled 2021-07-12: qty 100

## 2021-07-12 MED ORDER — LIDOCAINE 5 % EX PTCH
1.0000 | MEDICATED_PATCH | CUTANEOUS | Status: DC
  Administered 2021-07-12 – 2021-08-18 (×30): 1 via TRANSDERMAL
  Filled 2021-07-12 (×35): qty 1

## 2021-07-12 MED ORDER — DEXAMETHASONE SODIUM PHOSPHATE 10 MG/ML IJ SOLN
INTRAMUSCULAR | Status: AC
  Filled 2021-07-12: qty 2

## 2021-07-12 MED ORDER — 0.9 % SODIUM CHLORIDE (POUR BTL) OPTIME
TOPICAL | Status: DC | PRN
  Administered 2021-07-12: 2000 mL

## 2021-07-12 MED ORDER — FENTANYL CITRATE (PF) 100 MCG/2ML IJ SOLN
INTRAMUSCULAR | Status: AC
  Filled 2021-07-12: qty 2

## 2021-07-12 MED ORDER — PROPOFOL 10 MG/ML IV BOLUS
INTRAVENOUS | Status: DC | PRN
  Administered 2021-07-12: 200 mg via INTRAVENOUS

## 2021-07-12 MED ORDER — FENTANYL CITRATE (PF) 100 MCG/2ML IJ SOLN
INTRAMUSCULAR | Status: DC | PRN
  Administered 2021-07-12: 100 ug via INTRAVENOUS
  Administered 2021-07-12: 50 ug via INTRAVENOUS
  Administered 2021-07-12: 150 ug via INTRAVENOUS
  Administered 2021-07-12 (×2): 50 ug via INTRAVENOUS

## 2021-07-12 MED ORDER — PHENYLEPHRINE HCL (PRESSORS) 10 MG/ML IV SOLN
INTRAVENOUS | Status: DC | PRN
  Administered 2021-07-12: 120 ug via INTRAVENOUS
  Administered 2021-07-12: 80 ug via INTRAVENOUS
  Administered 2021-07-12: 100 ug via INTRAVENOUS

## 2021-07-12 MED ORDER — PHENYLEPHRINE HCL-NACL 20-0.9 MG/250ML-% IV SOLN
INTRAVENOUS | Status: DC | PRN
  Administered 2021-07-12: 30 ug/min via INTRAVENOUS

## 2021-07-12 MED ORDER — ACETAMINOPHEN 160 MG/5ML PO SOLN
1000.0000 mg | Freq: Four times a day (QID) | ORAL | Status: DC
  Administered 2021-07-12 – 2021-07-27 (×36): 1000 mg
  Filled 2021-07-12 (×44): qty 40.6

## 2021-07-12 MED ORDER — ONDANSETRON HCL 4 MG/2ML IJ SOLN: 4.0000 mg | Freq: Once | INTRAMUSCULAR | Status: DC | PRN

## 2021-07-12 MED ORDER — LACTATED RINGERS IV SOLN: INTRAVENOUS | Status: DC | PRN

## 2021-07-12 MED ORDER — KCL IN DEXTROSE-NACL 10-5-0.45 MEQ/L-%-% IV SOLN
INTRAVENOUS | Status: DC
  Filled 2021-07-12 (×5): qty 1000

## 2021-07-12 MED ORDER — FENTANYL CITRATE (PF) 250 MCG/5ML IJ SOLN
INTRAMUSCULAR | Status: AC
  Filled 2021-07-12: qty 5

## 2021-07-12 MED ORDER — ACETAMINOPHEN 500 MG PO TABS: 1000.0000 mg | ORAL_TABLET | Freq: Four times a day (QID) | ORAL | Status: DC

## 2021-07-12 MED ORDER — MORPHINE SULFATE (PF) 2 MG/ML IV SOLN
2.0000 mg | INTRAVENOUS | Status: DC | PRN
  Administered 2021-07-13: 4 mg via INTRAVENOUS
  Administered 2021-07-13: 3 mg via INTRAVENOUS
  Administered 2021-07-13: 4 mg via INTRAVENOUS
  Administered 2021-07-13 (×2): 3 mg via INTRAVENOUS
  Administered 2021-07-14 – 2021-07-19 (×24): 2 mg via INTRAVENOUS
  Administered 2021-07-19 (×2): 4 mg via INTRAVENOUS
  Administered 2021-07-19: 2 mg via INTRAVENOUS
  Administered 2021-07-20 (×2): 4 mg via INTRAVENOUS
  Administered 2021-07-20 (×2): 2 mg via INTRAVENOUS
  Administered 2021-07-20: 4 mg via INTRAVENOUS
  Filled 2021-07-12 (×5): qty 1
  Filled 2021-07-12: qty 2
  Filled 2021-07-12: qty 1
  Filled 2021-07-12: qty 2
  Filled 2021-07-12 (×4): qty 1
  Filled 2021-07-12: qty 2
  Filled 2021-07-12 (×2): qty 1
  Filled 2021-07-12 (×2): qty 2
  Filled 2021-07-12 (×6): qty 1
  Filled 2021-07-12 (×4): qty 2
  Filled 2021-07-12 (×2): qty 1
  Filled 2021-07-12: qty 2
  Filled 2021-07-12: qty 1
  Filled 2021-07-12: qty 2
  Filled 2021-07-12 (×2): qty 1
  Filled 2021-07-12: qty 2
  Filled 2021-07-12 (×3): qty 1

## 2021-07-12 MED ORDER — CHLORHEXIDINE GLUCONATE CLOTH 2 % EX PADS
6.0000 | MEDICATED_PAD | Freq: Every day | CUTANEOUS | Status: DC
  Administered 2021-07-13 – 2021-08-17 (×33): 6 via TOPICAL

## 2021-07-12 MED ORDER — ROCURONIUM BROMIDE 10 MG/ML (PF) SYRINGE
PREFILLED_SYRINGE | INTRAVENOUS | Status: AC
  Filled 2021-07-12: qty 40

## 2021-07-12 MED ORDER — MIDAZOLAM HCL 2 MG/2ML IJ SOLN
INTRAMUSCULAR | Status: AC
  Filled 2021-07-12: qty 2

## 2021-07-12 MED ORDER — HYDROMORPHONE HCL 1 MG/ML IJ SOLN
INTRAMUSCULAR | Status: AC
  Filled 2021-07-12: qty 1

## 2021-07-12 MED ORDER — SUGAMMADEX SODIUM 200 MG/2ML IV SOLN
INTRAVENOUS | Status: DC | PRN
  Administered 2021-07-12: 200 mg via INTRAVENOUS

## 2021-07-12 MED ORDER — ENOXAPARIN SODIUM 40 MG/0.4ML IJ SOSY
40.0000 mg | PREFILLED_SYRINGE | INTRAMUSCULAR | Status: DC
  Administered 2021-07-13 – 2021-07-17 (×5): 40 mg via SUBCUTANEOUS
  Filled 2021-07-12 (×5): qty 0.4

## 2021-07-12 MED ORDER — LIDOCAINE HCL (CARDIAC) PF 100 MG/5ML IV SOSY
PREFILLED_SYRINGE | INTRAVENOUS | Status: DC | PRN
  Administered 2021-07-12: 30 mg via INTRAVENOUS

## 2021-07-12 MED ORDER — MIDAZOLAM HCL 5 MG/5ML IJ SOLN
INTRAMUSCULAR | Status: DC | PRN
  Administered 2021-07-12: 2 mg via INTRAVENOUS

## 2021-07-12 MED ORDER — METHOCARBAMOL 1000 MG/10ML IJ SOLN
500.0000 mg | Freq: Three times a day (TID) | INTRAVENOUS | Status: DC
  Administered 2021-07-12 – 2021-07-13 (×2): 500 mg via INTRAVENOUS
  Filled 2021-07-12: qty 5
  Filled 2021-07-12: qty 500

## 2021-07-12 MED ORDER — ONDANSETRON HCL 4 MG/2ML IJ SOLN
INTRAMUSCULAR | Status: AC
  Filled 2021-07-12: qty 4

## 2021-07-12 MED ORDER — HYDROMORPHONE HCL 1 MG/ML IJ SOLN
0.2500 mg | INTRAMUSCULAR | Status: DC | PRN
  Administered 2021-07-12 (×4): 0.5 mg via INTRAVENOUS

## 2021-07-12 MED ORDER — FENTANYL CITRATE (PF) 100 MCG/2ML IJ SOLN
25.0000 ug | INTRAMUSCULAR | Status: DC | PRN
  Administered 2021-07-12 (×3): 50 ug via INTRAVENOUS

## 2021-07-12 MED ORDER — ONDANSETRON HCL 4 MG/2ML IJ SOLN
INTRAMUSCULAR | Status: DC | PRN
  Administered 2021-07-12: 4 mg via INTRAVENOUS

## 2021-07-12 MED ORDER — ACETAMINOPHEN 10 MG/ML IV SOLN
INTRAVENOUS | Status: DC | PRN
  Administered 2021-07-12: 1000 mg via INTRAVENOUS

## 2021-07-12 MED ORDER — PHENYLEPHRINE 40 MCG/ML (10ML) SYRINGE FOR IV PUSH (FOR BLOOD PRESSURE SUPPORT)
PREFILLED_SYRINGE | INTRAVENOUS | Status: AC
  Filled 2021-07-12: qty 10

## 2021-07-12 MED ORDER — KETAMINE HCL 50 MG/5ML IJ SOSY
PREFILLED_SYRINGE | INTRAMUSCULAR | Status: AC
  Filled 2021-07-12: qty 5

## 2021-07-12 MED ORDER — SUCCINYLCHOLINE CHLORIDE 200 MG/10ML IV SOSY
PREFILLED_SYRINGE | INTRAVENOUS | Status: DC | PRN
  Administered 2021-07-12: 100 mg via INTRAVENOUS

## 2021-07-12 MED ORDER — DEXAMETHASONE SODIUM PHOSPHATE 10 MG/ML IJ SOLN
INTRAMUSCULAR | Status: DC | PRN
  Administered 2021-07-12: 10 mg via INTRAVENOUS

## 2021-07-12 MED ORDER — SUCCINYLCHOLINE CHLORIDE 200 MG/10ML IV SOSY
PREFILLED_SYRINGE | INTRAVENOUS | Status: AC
  Filled 2021-07-12: qty 10

## 2021-07-12 MED ORDER — ROCURONIUM BROMIDE 10 MG/ML (PF) SYRINGE
PREFILLED_SYRINGE | INTRAVENOUS | Status: DC | PRN
  Administered 2021-07-12: 50 mg via INTRAVENOUS
  Administered 2021-07-12: 20 mg via INTRAVENOUS

## 2021-07-12 MED ORDER — ACETAMINOPHEN 10 MG/ML IV SOLN
INTRAVENOUS | Status: AC
  Filled 2021-07-12: qty 100

## 2021-07-12 MED ORDER — KETOROLAC TROMETHAMINE 15 MG/ML IJ SOLN
15.0000 mg | Freq: Four times a day (QID) | INTRAMUSCULAR | Status: DC
  Administered 2021-07-12 – 2021-07-17 (×19): 15 mg via INTRAVENOUS
  Filled 2021-07-12 (×20): qty 1

## 2021-07-12 MED ORDER — KETAMINE HCL 50 MG/5ML IJ SOSY
20.0000 mg | PREFILLED_SYRINGE | Freq: Once | INTRAMUSCULAR | Status: AC
  Administered 2021-07-12: 20 mg via INTRAVENOUS

## 2021-07-12 MED ORDER — PROPOFOL 10 MG/ML IV BOLUS
INTRAVENOUS | Status: AC
  Filled 2021-07-12: qty 20

## 2021-07-12 SURGICAL SUPPLY — 43 items
APL PRP STRL LF DISP 70% ISPRP (MISCELLANEOUS) ×1
BAG COUNTER SPONGE SURGICOUNT (BAG) ×2 IMPLANT
BAG SPNG CNTER NS LX DISP (BAG) ×1
CHLORAPREP W/TINT 26 (MISCELLANEOUS) ×2 IMPLANT
CLIP VESOCCLUDE LG 6/CT (CLIP) ×2 IMPLANT
CLIP VESOCCLUDE MED 6/CT (CLIP) ×2 IMPLANT
CLIP VESOCCLUDE SM WIDE 6/CT (CLIP) ×2 IMPLANT
COVER SURGICAL LIGHT HANDLE (MISCELLANEOUS) ×2 IMPLANT
DRAPE LAPAROSCOPIC ABDOMINAL (DRAPES) ×2 IMPLANT
DRAPE WARM FLUID 44X44 (DRAPES) ×2 IMPLANT
DRSG OPSITE POSTOP 4X10 (GAUZE/BANDAGES/DRESSINGS) ×1 IMPLANT
DRSG OPSITE POSTOP 4X8 (GAUZE/BANDAGES/DRESSINGS) IMPLANT
ELECT BLADE 6.5 EXT (BLADE) IMPLANT
ELECT CAUTERY BLADE 6.4 (BLADE) ×1 IMPLANT
ELECT REM PT RETURN 9FT ADLT (ELECTROSURGICAL) ×2
ELECTRODE REM PT RTRN 9FT ADLT (ELECTROSURGICAL) ×1 IMPLANT
GLOVE SURG POLYISO LF SZ7 (GLOVE) ×2 IMPLANT
GLOVE SURG UNDER POLY LF SZ7 (GLOVE) ×2 IMPLANT
GOWN STRL REUS W/ TWL LRG LVL3 (GOWN DISPOSABLE) ×2 IMPLANT
GOWN STRL REUS W/TWL LRG LVL3 (GOWN DISPOSABLE) ×10
HANDLE SUCTION POOLE (INSTRUMENTS) ×1 IMPLANT
KIT BASIN OR (CUSTOM PROCEDURE TRAY) ×2 IMPLANT
LIGASURE IMPACT 36 18CM CVD LR (INSTRUMENTS) IMPLANT
LOOP VESSEL MAXI BLUE (MISCELLANEOUS) IMPLANT
LOOP VESSEL MINI RED (MISCELLANEOUS) IMPLANT
NEEDLE 22X1 1/2 (OR ONLY) (NEEDLE) ×1 IMPLANT
PACK GENERAL/GYN (CUSTOM PROCEDURE TRAY) ×2 IMPLANT
PENCIL SMOKE EVACUATOR (MISCELLANEOUS) ×2 IMPLANT
SPONGE T-LAP 18X18 ~~LOC~~+RFID (SPONGE) ×2 IMPLANT
STAPLER VISISTAT 35W (STAPLE) ×2 IMPLANT
SUCTION POOLE HANDLE (INSTRUMENTS) ×2
SUT PDS AB 0 CTX 36 PDP370T (SUTURE) ×2 IMPLANT
SUT PDS AB 1 TP1 96 (SUTURE) IMPLANT
SUT PDS AB 3-0 SH 27 (SUTURE) ×1 IMPLANT
SUT SILK 2 0 (SUTURE) ×2
SUT SILK 2 0 SH CR/8 (SUTURE) ×2 IMPLANT
SUT SILK 2-0 18XBRD TIE 12 (SUTURE) ×1 IMPLANT
SUT SILK 3 0 (SUTURE) ×2
SUT SILK 3 0 SH CR/8 (SUTURE) ×3 IMPLANT
SUT SILK 3-0 18XBRD TIE 12 (SUTURE) ×1 IMPLANT
TOWEL GREEN STERILE (TOWEL DISPOSABLE) ×2 IMPLANT
TRAY FOLEY MTR SLVR 16FR STAT (SET/KITS/TRAYS/PACK) ×2 IMPLANT
YANKAUER SUCT BULB TIP NO VENT (SUCTIONS) IMPLANT
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
## 2021-07-12 NOTE — Anesthesia Postprocedure Evaluation (Signed)
Anesthesia Post Note  Patient: Thomas [name]  Procedure(s) Performed: EXPLORATORY LAPAROTOMY; SMALL INTESTINE REPAIR X 2 (Abdomen) LYSIS OF ADHESIONS (Abdomen)     Patient location during evaluation: PACU Anesthesia Type: General Level of consciousness: awake and alert Pain management: pain level controlled Vital Signs Assessment: post-procedure vital signs reviewed and stable Respiratory status: spontaneous breathing, nonlabored ventilation, respiratory function stable and patient connected to nasal cannula oxygen Cardiovascular status: blood pressure returned to baseline and stable Postop Assessment: no apparent nausea or vomiting Anesthetic complications: no   No notable events documented.  Last Vitals:  Vitals:   07/12/21 1500 07/12/21 1515  BP: 131/88 139/83  Pulse: 100 98  Resp: 17 15  Temp:  36.7 C  SpO2: 98% 98%    Last Pain:  Vitals:   07/12/21 1515  TempSrc:   PainSc: Asleep                 Dick Hark L Shanya Ferriss

## 2021-07-12 NOTE — Anesthesia Procedure Notes (Signed)
Procedure Name: Intubation Date/Time: 07/12/2021 11:10 AM Performed by: Gwenyth Allegra, CRNA Pre-anesthesia Checklist: Patient identified, Emergency Drugs available, Suction available, Patient being monitored and Timeout performed Patient Re-evaluated:Patient Re-evaluated prior to induction Oxygen Delivery Method: Circle system utilized Preoxygenation: Pre-oxygenation with 100% oxygen Induction Type: IV induction, Rapid sequence and Cricoid Pressure applied Laryngoscope Size: 4 Grade View: Grade II Tube type: Oral Tube size: 7.5 mm Number of attempts: 1 Airway Equipment and Method: Stylet Placement Confirmation: ETT inserted through vocal cords under direct vision, positive ETCO2 and breath sounds checked- equal and bilateral Secured at: 23 cm Tube secured with: Tape Dental Injury: Teeth and Oropharynx as per pre-operative assessment

## 2021-07-12 NOTE — Progress Notes (Addendum)
Pt had turned over in bed and NG tube was dislodged.  Advanced without difficulty and placement verified by auscultation and gastric contents in tubing.  Portable chest xray ordered to verify. Hilton Sinclair BSN RN CMSRN  Xray verified satisfactory tube position. Hilton Sinclair BSN RN CMSRN

## 2021-07-12 NOTE — Plan of Care (Signed)

## 2021-07-12 NOTE — Op Note (Signed)
Preoperative diagnosis: small bowel obstruction  Postoperative diagnosis: same   Procedure: lysis of adhesions for 90 minutes, repair of small intestine x 2  Surgeon: Feliciana Rossetti, M.D.  Asst: Bailey Mech, [facility]  Anesthesia: general  Indications for procedure: Thomas [name] is a 50 y.o. year old male with symptoms of abdominal pain and vomiting. He had a CT scan to confirm and underwent NG decompression without improvement.Marland Kitchen  Description of procedure: The patient was brought into the operative suite. Anesthesia was administered with General endotracheal anesthesia. WHO checklist was applied. The patient was then placed in supine position. The area was prepped and draped in the usual sterile fashion.  Midline incision was made cautery cyst dissect down through the subcutaneous tissue.  The fascia was entered.  The small intestine was densely adherent to the fascia and sharp dissection was used to separate the intestine away from the fascia.  There was a small injury in the intestine made during this maneuver due to level of adhesions.  Is fully dissected away fascia was further incised in the midline.  Small intestine was very dilated and lysis of adhesions was performed with sharp dissection with Metzenbaums scissors and occasional cautery.  There was a dense scar to the ileum that appeared to be the transition point.  The sigmoid colon was adherent to the mesentery of the small intestine causing additional narrowings.  Small intestine was completely freed from the ligament of Treitz down to the ileocecal valve.  Contents of the small intestine was pushed back up into the duodenum and stomach and suctioned out via the NG tube greater than 1 L was removed in this fashion.  Next session was turned to inspect for any injuries there was 1 serosal injury that is approximately 3 cm in width this was repaired with 3-0 silk Lembert sutures.  The full-thickness injury was identified and was less than 1 cm.   It was repaired with 3-0 PDS in running fashion and then the 3-0 silk Lembert sutures.  Abdomen was irrigated.  Fascia was closed with 0 PDS in running fashion.  Skin was closed with staples.  All counts were correct.  Patient tolerated procedure well was brought to PACU in stable condition.  Findings: adhesive disease throughout the small intestine. Thick adhesive band near the ileum.  Specimen: none  Implant: none   Blood loss: 200 ml  Local anesthesia: none  Complications: none  Feliciana Rossetti, M.D. General, Bariatric, & Minimally Invasive Surgery [facility]

## 2021-07-12 NOTE — Transfer of Care (Signed)
Immediate Anesthesia Transfer of Care Note  Patient: Thomas [name]  Procedure(s) Performed: EXPLORATORY LAPAROTOMY; SMALL INTESTINE REPAIR X 2 (Abdomen) LYSIS OF ADHESIONS (Abdomen)  Patient Location: PACU  Anesthesia Type:General  Level of Consciousness: awake and alert   Airway & Oxygen Therapy: Patient Spontanous Breathing and Patient connected to nasal cannula oxygen  Post-op Assessment: Report given to RN and Post -op Vital signs reviewed and stable  Post vital signs: Reviewed and stable  Last Vitals:  Vitals Value Taken Time  BP 139/100 07/12/21 1346  Temp    Pulse 79 07/12/21 1350  Resp 12 07/12/21 1350  SpO2 100 % 07/12/21 1350  Vitals shown include unvalidated device data.  Last Pain:  Vitals:   07/12/21 1053  TempSrc: Oral  PainSc:          Complications: No notable events documented.

## 2021-07-13 ENCOUNTER — Encounter (HOSPITAL_COMMUNITY): Payer: Self-pay | Admitting: General Surgery

## 2021-07-13 LAB — PHOSPHORUS: Phosphorus: 3.6 mg/dL (ref 2.5–4.6)

## 2021-07-13 LAB — CBC
HCT: 49.2 % (ref 39.0–52.0)
Hemoglobin: 16.3 g/dL (ref 13.0–17.0)
MCH: 29.7 pg (ref 26.0–34.0)
MCHC: 33.1 g/dL (ref 30.0–36.0)
MCV: 89.8 fL (ref 80.0–100.0)
Platelets: 319 10*3/uL (ref 150–400)
RBC: 5.48 MIL/uL (ref 4.22–5.81)
RDW: 12.9 % (ref 11.5–15.5)
WBC: 12.4 10*3/uL — ABNORMAL HIGH (ref 4.0–10.5)
nRBC: 0 % (ref 0.0–0.2)

## 2021-07-13 LAB — COMPREHENSIVE METABOLIC PANEL
ALT: 13 U/L (ref 0–44)
AST: 22 U/L (ref 15–41)
Albumin: 2.4 g/dL — ABNORMAL LOW (ref 3.5–5.0)
Alkaline Phosphatase: 57 U/L (ref 38–126)
Anion gap: 9 (ref 5–15)
BUN: 18 mg/dL (ref 6–20)
CO2: 25 mmol/L (ref 22–32)
Calcium: 8.2 mg/dL — ABNORMAL LOW (ref 8.9–10.3)
Chloride: 102 mmol/L (ref 98–111)
Creatinine, Ser: 1.07 mg/dL (ref 0.61–1.24)
GFR, Estimated: >60 mL/min (ref >60)
Glucose, Bld: 122 mg/dL — ABNORMAL HIGH (ref 70–99)
Potassium: 4.3 mmol/L (ref 3.5–5.1)
Sodium: 136 mmol/L (ref 135–145)
Total Bilirubin: 0.5 mg/dL (ref 0.3–1.2)
Total Protein: 5.8 g/dL — ABNORMAL LOW (ref 6.5–8.1)

## 2021-07-13 LAB — MAGNESIUM: Magnesium: 1.6 mg/dL — ABNORMAL LOW (ref 1.7–2.4)

## 2021-07-13 MED ORDER — LACTATED RINGERS IV BOLUS
1000.0000 mL | Freq: Once | INTRAVENOUS | Status: AC
  Administered 2021-07-13: 1000 mL via INTRAVENOUS

## 2021-07-13 MED ORDER — MAGNESIUM SULFATE 4 GM/100ML IV SOLN
4.0000 g | Freq: Once | INTRAVENOUS | Status: AC
  Administered 2021-07-13: 4 g via INTRAVENOUS
  Filled 2021-07-13: qty 100

## 2021-07-13 MED ORDER — SODIUM CHLORIDE 0.9 % IV BOLUS
1000.0000 mL | Freq: Once | INTRAVENOUS | Status: AC
  Administered 2021-07-13: 1000 mL via INTRAVENOUS

## 2021-07-13 MED ORDER — OXYCODONE HCL 5 MG PO TABS
5.0000 mg | ORAL_TABLET | Freq: Four times a day (QID) | ORAL | Status: DC | PRN
  Administered 2021-07-15: 10 mg via ORAL
  Filled 2021-07-13: qty 2

## 2021-07-13 MED ORDER — ACETAMINOPHEN 10 MG/ML IV SOLN
1000.0000 mg | Freq: Four times a day (QID) | INTRAVENOUS | Status: AC
  Administered 2021-07-13 – 2021-07-14 (×4): 1000 mg via INTRAVENOUS
  Filled 2021-07-13 (×4): qty 100

## 2021-07-13 MED ORDER — METHOCARBAMOL 1000 MG/10ML IJ SOLN
1000.0000 mg | Freq: Three times a day (TID) | INTRAVENOUS | Status: DC
  Administered 2021-07-13 – 2021-08-09 (×81): 1000 mg via INTRAVENOUS
  Filled 2021-07-13 (×2): qty 10
  Filled 2021-07-13: qty 1000
  Filled 2021-07-13: qty 10
  Filled 2021-07-13 (×5): qty 1000
  Filled 2021-07-13: qty 10
  Filled 2021-07-13 (×5): qty 1000
  Filled 2021-07-13 (×3): qty 10
  Filled 2021-07-13 (×3): qty 1000
  Filled 2021-07-13 (×3): qty 10
  Filled 2021-07-13: qty 1000
  Filled 2021-07-13 (×3): qty 10
  Filled 2021-07-13: qty 1000
  Filled 2021-07-13 (×3): qty 10
  Filled 2021-07-13 (×2): qty 1000
  Filled 2021-07-13: qty 10
  Filled 2021-07-13 (×6): qty 1000
  Filled 2021-07-13 (×2): qty 10
  Filled 2021-07-13 (×2): qty 1000
  Filled 2021-07-13: qty 10
  Filled 2021-07-13: qty 1000
  Filled 2021-07-13 (×6): qty 10
  Filled 2021-07-13: qty 1000
  Filled 2021-07-13 (×2): qty 10
  Filled 2021-07-13 (×3): qty 1000
  Filled 2021-07-13 (×2): qty 10
  Filled 2021-07-13 (×4): qty 1000
  Filled 2021-07-13 (×3): qty 10
  Filled 2021-07-13 (×2): qty 1000
  Filled 2021-07-13 (×4): qty 10
  Filled 2021-07-13: qty 1000
  Filled 2021-07-13: qty 10
  Filled 2021-07-13: qty 1000
  Filled 2021-07-13: qty 10
  Filled 2021-07-13: qty 1000
  Filled 2021-07-13 (×4): qty 10
  Filled 2021-07-13: qty 1000
  Filled 2021-07-13 (×2): qty 10
  Filled 2021-07-13: qty 1000

## 2021-07-13 NOTE — Progress Notes (Signed)
   07/13/21 0728  Assess: MEWS Score  Temp (!) 97.4 F (36.3 C)  BP 123/82  Pulse Rate (!) 117  Resp 16  SpO2 100 %  Assess: MEWS Score  MEWS Temp 0  MEWS Systolic 0  MEWS Pulse 2  MEWS RR 0  MEWS LOC 0  MEWS Score 2  MEWS Score Color Yellow  Assess: if the MEWS score is Yellow or Red  Were vital signs taken at a resting state? Yes  Focused Assessment No change from prior assessment  Early Detection of Sepsis Score *See Row Information* Low  MEWS guidelines implemented *See Row Information* Yes  Treat  MEWS Interventions Administered scheduled meds/treatments  Pain Scale 0-10  Pain Score 10  Pain Type Surgical pain  Pain Location Abdomen  Pain Onset On-going  Pain Intervention(s) Medication (See eMAR)  Take Vital Signs  Increase Vital Sign Frequency  Yellow: Q 2hr X 2 then Q 4hr X 2, if remains yellow, continue Q 4hrs  Escalate  MEWS: Escalate Yellow: discuss with charge nurse/RN and consider discussing with provider and RRT  Notify: Charge Nurse/RN  Name of Charge Nurse/RN Notified Tina, RN  Date Charge Nurse/RN Notified 07/13/21  Time Charge Nurse/RN Notified [phone]   Patient is in pain, will give PRN pain medication and reassess.
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
## 2021-07-13 NOTE — Plan of Care (Signed)

## 2021-07-14 ENCOUNTER — Inpatient Hospital Stay: Payer: Self-pay

## 2021-07-14 LAB — CBC
HCT: 39.6 % (ref 39.0–52.0)
Hemoglobin: 13.1 g/dL (ref 13.0–17.0)
MCH: 29.8 pg (ref 26.0–34.0)
MCHC: 33.1 g/dL (ref 30.0–36.0)
MCV: 90 fL (ref 80.0–100.0)
Platelets: 238 10*3/uL (ref 150–400)
RBC: 4.4 MIL/uL (ref 4.22–5.81)
RDW: 13.2 % (ref 11.5–15.5)
WBC: 11.4 10*3/uL — ABNORMAL HIGH (ref 4.0–10.5)
nRBC: 0 % (ref 0.0–0.2)

## 2021-07-14 LAB — MAGNESIUM: Magnesium: 2.2 mg/dL (ref 1.7–2.4)

## 2021-07-14 LAB — GLUCOSE, CAPILLARY
Glucose-Capillary: 110 mg/dL — ABNORMAL HIGH (ref 70–99)
Glucose-Capillary: 87 mg/dL (ref 70–99)

## 2021-07-14 LAB — BASIC METABOLIC PANEL
Anion gap: 8 (ref 5–15)
BUN: 23 mg/dL — ABNORMAL HIGH (ref 6–20)
CO2: 21 mmol/L — ABNORMAL LOW (ref 22–32)
Calcium: 7.5 mg/dL — ABNORMAL LOW (ref 8.9–10.3)
Chloride: 102 mmol/L (ref 98–111)
Creatinine, Ser: 1.04 mg/dL (ref 0.61–1.24)
GFR, Estimated: >60 mL/min (ref >60)
Glucose, Bld: 102 mg/dL — ABNORMAL HIGH (ref 70–99)
Potassium: 5.3 mmol/L — ABNORMAL HIGH (ref 3.5–5.1)
Sodium: 131 mmol/L — ABNORMAL LOW (ref 135–145)

## 2021-07-14 LAB — PHOSPHORUS: Phosphorus: 2.4 mg/dL — ABNORMAL LOW (ref 2.5–4.6)

## 2021-07-14 MED ORDER — SODIUM CHLORIDE 0.9% FLUSH
10.0000 mL | INTRAVENOUS | Status: DC | PRN
  Administered 2021-07-24 – 2021-08-17 (×5): 10 mL

## 2021-07-14 MED ORDER — SODIUM PHOSPHATES 45 MMOLE/15ML IV SOLN
20.0000 mmol | Freq: Once | INTRAVENOUS | Status: AC
  Administered 2021-07-14: 20 mmol via INTRAVENOUS
  Filled 2021-07-14: qty 6.67

## 2021-07-14 MED ORDER — INSULIN ASPART 100 UNIT/ML IJ SOLN
0.0000 [IU] | INTRAMUSCULAR | Status: DC
  Administered 2021-07-16 – 2021-07-22 (×20): 1 [IU] via SUBCUTANEOUS

## 2021-07-14 MED ORDER — SODIUM CHLORIDE 0.9 % IV SOLN: INTRAVENOUS | Status: AC

## 2021-07-14 MED ORDER — TRAVASOL 10 % IV SOLN
INTRAVENOUS | Status: AC
  Filled 2021-07-14: qty 470.4
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
## 2021-07-14 NOTE — Plan of Care (Signed)

## 2021-07-14 NOTE — Progress Notes (Signed)
2 Days Post-Op   Chief Complaint/Subjective: Pain better controlled today after adjustments to meds made yesterday.  No flatus.  + belching.  Much better UOP at 1300cc.  950cc of NGT output   Review of Systems See above, otherwise other systems negative  Objective: Vital signs in last 24 hours: Temp:  [97.2 F (36.2 C)-98.7 F (37.1 C)] 98.7 F (37.1 C) (09/30 0500) Pulse Rate:  [102-110] 104 (09/30 0500) Resp:  [14-18] 18 (09/30 0500) BP: (109-115)/(70-76) 113/73 (09/30 0500) SpO2:  [94 %-100 %] 97 % (09/30 0500) Last BM Date: 07/07/21 Intake/Output from previous day: 09/29 0701 - 09/30 0700 In: 3550.6 [I.V.:2953.2; IV Piggyback:597.4] Out: 2100 [Urine:1300; Emesis/NG output:800] Intake/Output this shift: No intake/output data recorded.  PE: Gen: NAD Resp: nonlabored Card: RRR Abd: soft, appropriately tender, midline incision with honeycomb dressing in place, small amt sanguinous drainage on bandage.   NG - 950 cc/24h, dark and bilious GU: foley with yellow straw colored urine  Lab Results:  Recent Labs    07/13/21 0346 07/14/21 0148  WBC 12.4* 11.4*  HGB 16.3 13.1  HCT 49.2 39.6  PLT 319 238   BMET Recent Labs    07/13/21 0346 07/14/21 0148  NA 136 131*  K 4.3 5.3*  CL 102 102  CO2 25 21*  GLUCOSE 122* 102*  BUN 18 23*  CREATININE 1.07 1.04  CALCIUM 8.2* 7.5*   PT/INR No results for input(s): LABPROT, INR in the last 72 hours. CMP     Component Value Date/Time   NA 131 (L) 07/14/2021 0148   K 5.3 (H) 07/14/2021 0148   CL 102 07/14/2021 0148   CO2 21 (L) 07/14/2021 0148   GLUCOSE 102 (H) 07/14/2021 0148   BUN 23 (H) 07/14/2021 0148   CREATININE 1.04 07/14/2021 0148   CALCIUM 7.5 (L) 07/14/2021 0148   PROT 5.8 (L) 07/13/2021 0346   ALBUMIN 2.4 (L) 07/13/2021 0346   AST 22 07/13/2021 0346   ALT 13 07/13/2021 0346   ALKPHOS 57 07/13/2021 0346   BILITOT 0.5 07/13/2021 0346   GFRNONAA >60 07/14/2021 0148   GFRAA >60 09/05/2019 2005    Lipase     Component Value Date/Time   LIPASE 40 07/10/2021 1008    Studies/Results: [place] EKG SITE RITE  Result Date: 07/14/2021 If Site Rite image not attached, placement could not be confirmed due to current cardiac rhythm.   Anti-infectives: Anti-infectives (From admission, onward)    Start     Dose/Rate Route Frequency Ordered Stop   07/12/21 1200  ceFAZolin (ANCEF) IVPB 2g/100 mL premix        2 g 200 mL/hr over 30 Minutes Intravenous To Short Stay 07/12/21 0843 07/12/21 1100       Assessment/Plan POD 2, S/P : EXPLORATORY LAPAROTOMY; LYSIS OF ADHESIONS; SMALL INTESTINE REPAIR X 2 07/12/21 Dr. Sheliah Hatch for SBO - anticipate post-operative ileus, await return of bowel function -will go ahead and insert picc and TNA due to expected prolonged ileus -multi-modal pain control worked much better for patient - OOB, mobilize  - incentive spirometry q 1h  FEN - NPO, NG to LIWS, PICC/TNA, DC K out of fluids due to K of 5.3 VTE - lovenox ID - ancef x1 dose 9/28 perioperatively Foley - placed 9/28, DC today Disposition - med-surg, post-op ileus, await return of bowel function  AKI - resolved  Bipolar disorder Homelessness Polysubstance abuse Low UOP - resolved   LOS: 4 days   Jeri Lager Surgery 07/14/2021,  9:47 AM Please see Amion for pager number during day hours 7:00am-4:30pm or 7:00am -11:30am on weekends

## 2021-07-14 NOTE — Progress Notes (Signed)
Peripherally Inserted Central Catheter Placement  The IV Nurse has discussed with the patient and/or persons authorized to consent for the patient, the purpose of this procedure and the potential benefits and risks involved with this procedure.  The benefits include less needle sticks, lab draws from the catheter, and the patient may be discharged home with the catheter. Risks include, but not limited to, infection, bleeding, blood clot (thrombus formation), and puncture of an artery; nerve damage and irregular heartbeat and possibility to perform a PICC exchange if needed/ordered by physician.  Alternatives to this procedure were also discussed.  Bard Power PICC patient education guide, fact sheet on infection prevention and patient information card has been provided to patient /or left at bedside.    PICC Placement Documentation  PICC Double Lumen 07/14/21 PICC Right Basilic 42 cm 0 cm (Active)  Indication for Insertion or Continuance of Line Administration of hyperosmolar/irritating solutions (i.e. TPN, Vancomycin, etc.) 07/14/21 1313  Exposed Catheter (cm) 0 cm 07/14/21 1313  Site Assessment Clean;Dry;Intact 07/14/21 1313  Lumen #1 Status Flushed;Saline locked;Blood return noted 07/14/21 1313  Lumen #2 Status Blood return noted;Saline locked;Flushed 07/14/21 1313  Dressing Type Transparent;Securing device 07/14/21 1313  Dressing Status Clean;Dry;Intact 07/14/21 1313  Antimicrobial disc in place? Yes 07/14/21 1313  Safety Lock Not Applicable 07/14/21 1313  Dressing Change Due 07/21/21 07/14/21 1313       Romie Jumper 07/14/2021, 1:16 PM

## 2021-07-14 NOTE — Progress Notes (Signed)
PHARMACY - TOTAL PARENTERAL NUTRITION CONSULT NOTE   Indication: Small bowel obstruction  Patient Measurements: Height: 5\' 9"  (175.3 cm) Weight: 59 kg (130 lb) IBW/kg (Calculated) : 70.7 TPN AdjBW (KG): 59 Body mass index is 19.2 kg/m. Usual Weight: 140 lbs  Assessment: 50 yo male presented on 07/10/2021 with abdominal pain, nausea, and vomiting for 3 days prior. PMH significant for polysubstance abuse, homelessness, bipolar disorder, SBO resulting in exlap, LOA and appendectomy at [facility] in May 2020. Patient reported one SBO since that was treated with NGT and improved. Pharmacy consulted to start TPN for SBO.   Glucose / Insulin: no hx DM. A1C 5.3 in April 2020. Bmet glucose 102-120s. No CBG monitorig.  Electrolytes: Na 131, K 5.3 (on D5 1/2 NS with 10 mEq/L MIVF - ~30 mEq/24 hrs), CO2 21, CoCa 8.8, Phos 2.4, Mg 2.2 (s/p Mg 4g x1) Renal: Scr 1.04 (at baseline), BUN 23 Hepatic: LFTs wnl, t bili wnl. No TG. Albumin 2.4  Intake / Output; MIVF: D5 1/2 NS with 10 mEq/L KCl @ 125 ml/hr. NG 950 ml. UOP 0.9 ml/kg/hr.  GI Imaging: - 9/26 xray abd: concerning for SBO - 9/26 CT abd: high grade partial SBO, suspected internal hernia vs. Complex adhesions in RLQ - 9/27 xray abd: continued SBO - 9/28 xray abd: multiple gas filled dilated loops similar to CT GI Surgeries / Procedures: none this admission  Central access: PICC to be placed 9/30 TPN start date: 9/30  Nutritional Goals: Goal TPN rate is 85 mL/hr (provides 100 g of protein and 2052 kcals per day)  RD Assessment: pending RD assessment  Estimated Needs Total Energy Estimated Needs: 2000-2200 Total Protein Estimated Needs: 90-110 grams Total Fluid Estimated Needs: >/= 2.0 L  Current Nutrition:  NPO  TPN to start 9/30  Plan:  Start TPN at 28mL/hr at 1800 which will provide 47g AA and 965 kcal Electrolytes in TPN: Na 10mEq/L, K 54mEq/L (~20 mEq/day), Ca 9mEq/L, Mg 42mEq/L, and Phos 24mmol/L. Cl:Ac 1:1 Na phos 20 mmol x1   Add standard MVI and trace elements to TPN Add thiamine 100mg  x 7 days for refeeding risk per RD (ends 10/6) Initiate Sensitive q4h SSI and adjust as needed  Stop D5 1/2 NS with KCl MIVF and change to NS Reduce MIVF to 85 mL/hr at 1800 Monitor TPN labs on Mon/Thurs, TPN labs tomorrow  12m, PharmD, BCPS Clinical Pharmacist  07/14/2021,10:09 AM

## 2021-07-15 DIAGNOSIS — E43 Unspecified severe protein-calorie malnutrition: Secondary | ICD-10-CM | POA: Insufficient documentation

## 2021-07-15 LAB — COMPREHENSIVE METABOLIC PANEL
ALT: 10 U/L (ref 0–44)
AST: 20 U/L (ref 15–41)
Albumin: 2 g/dL — ABNORMAL LOW (ref 3.5–5.0)
Alkaline Phosphatase: 58 U/L (ref 38–126)
Anion gap: 7 (ref 5–15)
BUN: 19 mg/dL (ref 6–20)
CO2: 28 mmol/L (ref 22–32)
Calcium: 8 mg/dL — ABNORMAL LOW (ref 8.9–10.3)
Chloride: 100 mmol/L (ref 98–111)
Creatinine, Ser: 0.97 mg/dL (ref 0.61–1.24)
GFR, Estimated: >60 mL/min (ref >60)
Glucose, Bld: 107 mg/dL — ABNORMAL HIGH (ref 70–99)
Potassium: 3.6 mmol/L (ref 3.5–5.1)
Sodium: 135 mmol/L (ref 135–145)
Total Bilirubin: 0.6 mg/dL (ref 0.3–1.2)
Total Protein: 5.5 g/dL — ABNORMAL LOW (ref 6.5–8.1)

## 2021-07-15 LAB — CBC
HCT: 35.8 % — ABNORMAL LOW (ref 39.0–52.0)
Hemoglobin: 11.7 g/dL — ABNORMAL LOW (ref 13.0–17.0)
MCH: 29.5 pg (ref 26.0–34.0)
MCHC: 32.7 g/dL (ref 30.0–36.0)
MCV: 90.2 fL (ref 80.0–100.0)
Platelets: 253 10*3/uL (ref 150–400)
RBC: 3.97 MIL/uL — ABNORMAL LOW (ref 4.22–5.81)
RDW: 13.1 % (ref 11.5–15.5)
WBC: 6.7 10*3/uL (ref 4.0–10.5)
nRBC: 0 % (ref 0.0–0.2)

## 2021-07-15 LAB — TRIGLYCERIDES: Triglycerides: 82 mg/dL (ref <150)

## 2021-07-15 LAB — GLUCOSE, CAPILLARY
Glucose-Capillary: 106 mg/dL — ABNORMAL HIGH (ref 70–99)
Glucose-Capillary: 114 mg/dL — ABNORMAL HIGH (ref 70–99)
Glucose-Capillary: 80 mg/dL (ref 70–99)
Glucose-Capillary: 98 mg/dL (ref 70–99)
Glucose-Capillary: 98 mg/dL (ref 70–99)
Glucose-Capillary: 99 mg/dL (ref 70–99)

## 2021-07-15 LAB — MAGNESIUM: Magnesium: 2.3 mg/dL (ref 1.7–2.4)

## 2021-07-15 LAB — PHOSPHORUS: Phosphorus: 2.8 mg/dL (ref 2.5–4.6)

## 2021-07-15 MED ORDER — POTASSIUM PHOSPHATES 15 MMOLE/5ML IV SOLN
10.0000 mmol | Freq: Once | INTRAVENOUS | Status: AC
  Administered 2021-07-15: 10 mmol via INTRAVENOUS
  Filled 2021-07-15: qty 3.33

## 2021-07-15 MED ORDER — TRAVASOL 10 % IV SOLN
INTRAVENOUS | Status: AC
  Filled 2021-07-15: qty 705.6

## 2021-07-15 MED ORDER — POTASSIUM CHLORIDE 10 MEQ/50ML IV SOLN
10.0000 meq | Freq: Once | INTRAVENOUS | Status: AC
  Administered 2021-07-15: 10 meq via INTRAVENOUS
  Filled 2021-07-15: qty 50

## 2021-07-15 MED ORDER — SODIUM CHLORIDE 0.9 % IV SOLN: INTRAVENOUS | Status: DC

## 2021-07-15 MED ORDER — OXYCODONE HCL 5 MG PO TABS
5.0000 mg | ORAL_TABLET | Freq: Four times a day (QID) | ORAL | Status: DC | PRN
  Administered 2021-07-16: 5 mg
  Administered 2021-07-16 – 2021-07-17 (×4): 10 mg
  Administered 2021-07-19: 5 mg
  Filled 2021-07-15: qty 1
  Filled 2021-07-15 (×3): qty 2
  Filled 2021-07-15: qty 1
  Filled 2021-07-15: qty 2

## 2021-07-15 MED ORDER — DIPHENHYDRAMINE HCL 12.5 MG/5ML PO ELIX: 25.0000 mg | ORAL_SOLUTION | Freq: Four times a day (QID) | ORAL | Status: DC | PRN

## 2021-07-15 MED ORDER — DIPHENHYDRAMINE HCL 50 MG/ML IJ SOLN
25.0000 mg | Freq: Four times a day (QID) | INTRAMUSCULAR | Status: DC | PRN
  Administered 2021-07-16 – 2021-07-25 (×3): 25 mg via INTRAVENOUS
  Filled 2021-07-15 (×4): qty 1

## 2021-07-15 NOTE — Progress Notes (Signed)
  3 Days Post-Op   Chief Complaint/Subjective: Pain controlled with IV meds.  No flatus.    Review of Systems See above, otherwise other systems negative  Objective: Vital signs in last 24 hours: Temp:  [98.1 F (36.7 C)-98.7 F (37.1 C)] 98.3 F (36.8 C) (10/01 0746) Pulse Rate:  [93-111] 96 (10/01 0746) Resp:  [17-19] 17 (10/01 0746) BP: (113-124)/(67-85) 120/85 (10/01 0746) SpO2:  [96 %-99 %] 96 % (10/01 0746) Last BM Date: 07/07/21 Intake/Output from previous day: 09/30 0701 - 10/01 0700 In: 3443.6 [I.V.:1393.6; NG/GT:1650; IV Piggyback:400] Out: 2900 [Urine:1200; Emesis/NG output:1700] Intake/Output this shift: No intake/output data recorded.  PE: Gen: NAD Resp: nonlabored Card: RRR Abd: soft, appropriately tender, distended, midline incision with honeycomb dressing in place, small amt sanguinous drainage on bandage.   NG - 1700 cc/24h, dark and bilious   Lab Results:  Recent Labs    07/14/21 0148 07/15/21 0235  WBC 11.4* 6.7  HGB 13.1 11.7*  HCT 39.6 35.8*  PLT 238 253    BMET Recent Labs    07/14/21 0148 07/15/21 0235  NA 131* 135  K 5.3* 3.6  CL 102 100  CO2 21* 28  GLUCOSE 102* 107*  BUN 23* 19  CREATININE 1.04 0.97  CALCIUM 7.5* 8.0*    PT/INR No results for input(s): LABPROT, INR in the last 72 hours. CMP     Component Value Date/Time   NA 135 07/15/2021 0235   K 3.6 07/15/2021 0235   CL 100 07/15/2021 0235   CO2 28 07/15/2021 0235   GLUCOSE 107 (H) 07/15/2021 0235   BUN 19 07/15/2021 0235   CREATININE 0.97 07/15/2021 0235   CALCIUM 8.0 (L) 07/15/2021 0235   PROT 5.5 (L) 07/15/2021 0235   ALBUMIN 2.0 (L) 07/15/2021 0235   AST 20 07/15/2021 0235   ALT 10 07/15/2021 0235   ALKPHOS 58 07/15/2021 0235   BILITOT 0.6 07/15/2021 0235   GFRNONAA >60 07/15/2021 0235   GFRAA >60 09/05/2019 2005   Lipase     Component Value Date/Time   LIPASE 40 07/10/2021 1008    Studies/Results: [place] EKG SITE RITE  Result Date: 07/14/2021 If  Site Rite image not attached, placement could not be confirmed due to current cardiac rhythm.   Anti-infectives: Anti-infectives (From admission, onward)    Start     Dose/Rate Route Frequency Ordered Stop   07/12/21 1200  ceFAZolin (ANCEF) IVPB 2g/100 mL premix        2 g 200 mL/hr over 30 Minutes Intravenous To Short Stay 07/12/21 0843 07/12/21 1100       Assessment/Plan POD 3, S/P : EXPLORATORY LAPAROTOMY; LYSIS OF ADHESIONS; SMALL INTESTINE REPAIR X 2 07/12/21 Dr. Sheliah Hatch for SBO - anticipate post-operative ileus, await return of bowel function -will go ahead and insert picc and TNA due to expected prolonged ileus -multi-modal pain control worked much better for patient - OOB, mobilize  - incentive spirometry q 1h  FEN - NPO, NG to LIWS, PICC/TNA VTE - lovenox ID - ancef x1 dose 9/28 perioperatively Foley -  DC 9/30 Disposition - med-surg, post-op ileus, await return of bowel function  AKI - resolved  Bipolar disorder Homelessness Polysubstance abuse Low UOP - resolved   LOS: 5 days   Lazaro Arms Surgery 07/15/2021, 8:51 AM Please see Amion for pager number during day hours 7:00am-4:30pm or 7:00am -11:30am on weekends

## 2021-07-15 NOTE — Progress Notes (Addendum)
PHARMACY - TOTAL PARENTERAL NUTRITION CONSULT NOTE   Indication: Prolonged ileus  Patient Measurements: Height: 5\' 9"  (175.3 cm) Weight: 59 kg (130 lb) IBW/kg (Calculated) : 70.7 TPN AdjBW (KG): 59 Body mass index is 19.2 kg/m. Usual Weight: 140 lbs  Assessment: 50 yo male presented on 07/10/2021 with abdominal pain, nausea, and vomiting for 3 days prior. PMH significant for polysubstance abuse, homelessness, bipolar disorder, SBO resulting in exlap, LOA and appendectomy at [facility] in May 2020. Patient reported one SBO since that was treated with NGT and improved. Pharmacy consulted to start TPN for SBO.   Glucose / Insulin: no hx DM. A1C 5.3 in April 2020. Bmet glucose previously controlled off TPN. CBGs 87-110. 0 units insulin used.  Electrolytes: Na 135, K 3.6 (decreased from 5.3 with K removed from MIVF), Phos 2.8 (s/p 20 mmol Na Phos), Mg 2.3. Other electrolytes wnl.  - previous hyperkalemia with MIVF w/ Kcl (~30 mEq/day) Renal: Scr 0.97 (at baseline), BUN wnl Hepatic: LFTs wnl, t bili wnl. Albumin 2.0. TG 82.  Intake / Output; MIVF: D5 1/2 NS with 10 mEq/L KCl changed to NS yesterday due to hyperkalemia and hyponatremia. NS @85  ml/hr. NG May 2020. UOP 0.8 ml/kg/hr.  GI Imaging: - 9/26 xray abd: concerning for SBO - 9/26 CT abd: high grade partial SBO, suspected internal hernia vs. Complex adhesions in RLQ - 9/27 xray abd: continued SBO - 9/28 xray abd: multiple gas filled dilated loops similar to CT GI Surgeries / Procedures: - 9/28 exlap, LOA, small intestine repair x2  Central access: PICC placed 9/30 TPN start date: 9/30  Nutritional Goals: Goal TPN rate is 85 mL/hr (provides 100 g of protein and 2052 kcals per day)  RD Assessment:  Estimated Needs Total Energy Estimated Needs: 2000-2200 Total Protein Estimated Needs: 90-110 grams Total Fluid Estimated Needs: >/= 2.0 L  Current Nutrition:  NPO  TPN to start 9/30  Plan:  Increase TPN  to 8mL/hr at 1800 which  will provide 71g AA and 1449 kcal Electrolytes in TPN: Na 75mEq/L, K 42mEq/L (~20 mEq/day), Ca 70mEq/L, Mg 58mEq/L, and Phos 2mmol/L. Cl:Ac 1:1 Kcl 10 mEq IV x1 (~25 mEq w/ K in K phos) 10 mmol K phos x1  Add standard MVI and trace elements to TPN Add thiamine 100mg  x 7 days for refeeding risk per RD (ends 10/6) Continue Sensitive q4h SSI and adjust as needed  Reduce MIVF to 65 mL/hr at 1800 Monitor TPN labs on Mon/Thurs, repeat electrolytes tomorrow   4m, PharmD, BCPS Clinical Pharmacist  07/15/2021,7:33 AM

## 2021-07-16 ENCOUNTER — Inpatient Hospital Stay (HOSPITAL_COMMUNITY): Payer: Self-pay

## 2021-07-16 LAB — GLUCOSE, CAPILLARY
Glucose-Capillary: 106 mg/dL — ABNORMAL HIGH (ref 70–99)
Glucose-Capillary: 118 mg/dL — ABNORMAL HIGH (ref 70–99)
Glucose-Capillary: 122 mg/dL — ABNORMAL HIGH (ref 70–99)
Glucose-Capillary: 127 mg/dL — ABNORMAL HIGH (ref 70–99)
Glucose-Capillary: 138 mg/dL — ABNORMAL HIGH (ref 70–99)
Glucose-Capillary: 87 mg/dL (ref 70–99)

## 2021-07-16 LAB — BASIC METABOLIC PANEL
Anion gap: 7 (ref 5–15)
BUN: 13 mg/dL (ref 6–20)
CO2: 27 mmol/L (ref 22–32)
Calcium: 7.9 mg/dL — ABNORMAL LOW (ref 8.9–10.3)
Chloride: 102 mmol/L (ref 98–111)
Creatinine, Ser: 0.68 mg/dL (ref 0.61–1.24)
GFR, Estimated: >60 mL/min (ref >60)
Glucose, Bld: 129 mg/dL — ABNORMAL HIGH (ref 70–99)
Potassium: 3.4 mmol/L — ABNORMAL LOW (ref 3.5–5.1)
Sodium: 136 mmol/L (ref 135–145)

## 2021-07-16 LAB — PHOSPHORUS: Phosphorus: 2.8 mg/dL (ref 2.5–4.6)

## 2021-07-16 LAB — MAGNESIUM: Magnesium: 2 mg/dL (ref 1.7–2.4)

## 2021-07-16 MED ORDER — TRAVASOL 10 % IV SOLN
INTRAVENOUS | Status: AC
  Filled 2021-07-16: qty 999.6

## 2021-07-16 MED ORDER — POTASSIUM CHLORIDE 10 MEQ/50ML IV SOLN
10.0000 meq | INTRAVENOUS | Status: DC
Start: 2021-07-16 — End: 2021-07-16
  Filled 2021-07-16 (×3): qty 50

## 2021-07-16 MED ORDER — POTASSIUM PHOSPHATES 15 MMOLE/5ML IV SOLN
10.0000 mmol | Freq: Once | INTRAVENOUS | Status: DC
  Filled 2021-07-16: qty 3.33

## 2021-07-16 MED ORDER — MAGNESIUM SULFATE IN D5W 1-5 GM/100ML-% IV SOLN
1.0000 g | Freq: Once | INTRAVENOUS | Status: AC
  Administered 2021-07-16: 1 g via INTRAVENOUS
  Filled 2021-07-16: qty 100

## 2021-07-16 MED ORDER — POTASSIUM PHOSPHATES 15 MMOLE/5ML IV SOLN
10.0000 mmol | Freq: Once | INTRAVENOUS | Status: AC
  Administered 2021-07-16: 10 mmol via INTRAVENOUS
  Filled 2021-07-16: qty 3.33

## 2021-07-16 MED ORDER — POTASSIUM CHLORIDE 10 MEQ/50ML IV SOLN
10.0000 meq | INTRAVENOUS | Status: DC
  Filled 2021-07-16 (×2): qty 50

## 2021-07-16 MED ORDER — POTASSIUM CHLORIDE 10 MEQ/50ML IV SOLN
10.0000 meq | INTRAVENOUS | Status: AC
Start: 2021-07-16 — End: 2021-07-16
  Administered 2021-07-16 (×3): 10 meq via INTRAVENOUS
  Filled 2021-07-16 (×3): qty 50

## 2021-07-16 NOTE — Progress Notes (Signed)
  4 Days Post-Op   Chief Complaint/Subjective: Pain controlled with IV meds.  No flatus or BM.     Objective: Vital signs in last 24 hours: Temp:  [97.6 F (36.4 C)-98.3 F (36.8 C)] 98.3 F (36.8 C) (10/02 0414) Pulse Rate:  [83-94] 91 (10/02 0414) Resp:  [16-17] 16 (10/02 0414) BP: (123-130)/(82-90) 130/82 (10/02 0414) SpO2:  [94 %-98 %] 98 % (10/02 0414) Last BM Date: 07/07/21 Intake/Output from previous day: 10/01 0701 - 10/02 0700 In: 1714.7 [I.V.:1291; NG/GT:60; IV Piggyback:363.7] Out: 3450 [Urine:800; Emesis/NG output:2650] Intake/Output this shift: No intake/output data recorded.  PE: Gen: NAD Resp: nonlabored Card: RRR Abd: soft, appropriately tender, distended, midline incision with honeycomb dressing in place, small amt sanguinous drainage on bandage.   NG - 2650 cc/24h, dark and bilious   Lab Results:  Recent Labs    07/14/21 0148 07/15/21 0235  WBC 11.4* 6.7  HGB 13.1 11.7*  HCT 39.6 35.8*  PLT 238 253    BMET Recent Labs    07/15/21 0235 07/16/21 0314  NA 135 136  K 3.6 3.4*  CL 100 102  CO2 28 27  GLUCOSE 107* 129*  BUN 19 13  CREATININE 0.97 0.68  CALCIUM 8.0* 7.9*    PT/INR No results for input(s): LABPROT, INR in the last 72 hours. CMP     Component Value Date/Time   NA 136 07/16/2021 0314   K 3.4 (L) 07/16/2021 0314   CL 102 07/16/2021 0314   CO2 27 07/16/2021 0314   GLUCOSE 129 (H) 07/16/2021 0314   BUN 13 07/16/2021 0314   CREATININE 0.68 07/16/2021 0314   CALCIUM 7.9 (L) 07/16/2021 0314   PROT 5.5 (L) 07/15/2021 0235   ALBUMIN 2.0 (L) 07/15/2021 0235   AST 20 07/15/2021 0235   ALT 10 07/15/2021 0235   ALKPHOS 58 07/15/2021 0235   BILITOT 0.6 07/15/2021 0235   GFRNONAA >60 07/16/2021 0314   GFRAA >60 09/05/2019 2005   Lipase     Component Value Date/Time   LIPASE 40 07/10/2021 1008    Studies/Results: [place] EKG SITE RITE  Result Date: 07/14/2021 If Site Rite image not attached, placement could not be confirmed  due to current cardiac rhythm.   Anti-infectives: Anti-infectives (From admission, onward)    Start     Dose/Rate Route Frequency Ordered Stop   07/12/21 1200  ceFAZolin (ANCEF) IVPB 2g/100 mL premix        2 g 200 mL/hr over 30 Minutes Intravenous To Short Stay 07/12/21 0843 07/12/21 1100       Assessment/Plan POD 3, S/P : EXPLORATORY LAPAROTOMY; LYSIS OF ADHESIONS; SMALL INTESTINE REPAIR X 2 07/12/21 Dr. Sheliah Hatch for SBO - anticipate post-operative ileus, await return of bowel function -will go ahead and insert picc and TNA due to expected prolonged ileus -multi-modal pain control worked much better for patient - OOB, mobilize  - incentive spirometry q 1h  FEN - NPO, NG to LIWS, PICC/TNA VTE - lovenox ID - ancef x1 dose 9/28 perioperatively Foley -  DC 9/30 Disposition - med-surg, post-op ileus, await return of bowel function  AKI - resolved  Bipolar disorder Homelessness Polysubstance abuse Low UOP - resolved   LOS: 6 days   Lazaro Arms Surgery 07/16/2021, 8:59 AM Please see Amion for pager number during day hours 7:00am-4:30pm or 7:00am -11:30am on weekends

## 2021-07-16 NOTE — Plan of Care (Signed)

## 2021-07-16 NOTE — Progress Notes (Signed)
PHARMACY - TOTAL PARENTERAL NUTRITION CONSULT NOTE   Indication: Prolonged ileus  Patient Measurements: Height: 5\' 9"  (175.3 cm) Weight: 59 kg (130 lb) IBW/kg (Calculated) : 70.7 TPN AdjBW (KG): 59 Body mass index is 19.2 kg/m. Usual Weight: 140 lbs  Assessment: 50 yo male presented on 07/10/2021 with abdominal pain, nausea, and vomiting for 3 days prior. PMH significant for polysubstance abuse, homelessness, bipolar disorder, SBO resulting in exlap, LOA and appendectomy at [facility] in May 2020. Patient reported one SBO since that was treated with NGT and improved. Pharmacy consulted to start TPN for SBO.   Glucose / Insulin: no hx DM. A1C 5.3 in April 2020. Bmet glucose previously controlled off TPN. CBGs 87-129. 0 units insulin used.  Electrolytes: Na 136, K 3.4 (s/p ~25 mEq K), Phos 2.8 (s/p 10 mmol k Phos), Mg 2.0. Other electrolytes wnl.  - previous hyperkalemia with MIVF w/ Kcl (~30 mEq/day) Renal: Scr 0.68, BUN wnl Hepatic: LFTs wnl, t bili wnl. Albumin 2.0. TG 82.  Intake / Output; MIVF: NS @60  ml/hr. NG 2650 ml. UOP 0.6 ml/kg/hr.  GI Imaging: - 9/26 xray abd: concerning for SBO - 9/26 CT abd: high grade partial SBO, suspected internal hernia vs. Complex adhesions in RLQ - 9/27 xray abd: continued SBO - 9/28 xray abd: multiple gas filled dilated loops similar to CT GI Surgeries / Procedures: - 9/28 exlap, LOA, small intestine repair x2  Central access: PICC placed 9/30 TPN start date: 9/30  Nutritional Goals: Goal TPN rate is 85 mL/hr (provides 100 g of protein and 2052 kcals per day)  RD Assessment:  Estimated Needs Total Energy Estimated Needs: 2000-2200 Total Protein Estimated Needs: 90-110 grams Total Fluid Estimated Needs: >/= 2.0 L  Current Nutrition:  NPO  TPN to start 9/30  Plan:  Increase TPN  to goal rate of 9mL/hr at 1800 which will provide 71g AA and 1449 kcal Electrolytes in TPN (no changes 10/2 as total amounts will increase with rate increase):  Na 31mEq/L, K 70mEq/L (~40 mEq/day), Ca 70mEq/L, Mg 37mEq/L, and Phos 40mmol/L. Cl:Ac 1:1 Kcl 10 mEq IV x3 (~45 mEq total K w/ K in K phos) 10 mmol K phos x1  Mg 1g x1  Add standard MVI and trace elements to TPN Add thiamine 100mg  x 7 days for refeeding risk per RD (ends 10/6) Continue Sensitive q4h SSI and adjust as needed  Reduce MIVF to 40 mL/hr at 1800 - f/u NGT output and ability to stop MIVF Monitor TPN labs on Mon/Thurs  4m, PharmD, BCPS Clinical Pharmacist  07/16/2021,7:54 AM

## 2021-07-17 ENCOUNTER — Inpatient Hospital Stay (HOSPITAL_COMMUNITY): Payer: Self-pay

## 2021-07-17 LAB — MAGNESIUM: Magnesium: 2 mg/dL (ref 1.7–2.4)

## 2021-07-17 LAB — URINALYSIS, ROUTINE W REFLEX MICROSCOPIC
Glucose, UA: NEGATIVE mg/dL
Ketones, ur: NEGATIVE mg/dL
Leukocytes,Ua: NEGATIVE
Nitrite: NEGATIVE
Protein, ur: 100 mg/dL — AB
RBC / HPF: >50 RBC/hpf — ABNORMAL HIGH (ref 0–5)
Specific Gravity, Urine: 1.036 — ABNORMAL HIGH (ref 1.005–1.030)
pH: 5 (ref 5.0–8.0)

## 2021-07-17 LAB — GLUCOSE, CAPILLARY
Glucose-Capillary: 136 mg/dL — ABNORMAL HIGH (ref 70–99)
Glucose-Capillary: 138 mg/dL — ABNORMAL HIGH (ref 70–99)
Glucose-Capillary: 144 mg/dL — ABNORMAL HIGH (ref 70–99)
Glucose-Capillary: 148 mg/dL — ABNORMAL HIGH (ref 70–99)
Glucose-Capillary: 96 mg/dL (ref 70–99)

## 2021-07-17 LAB — TRIGLYCERIDES: Triglycerides: 63 mg/dL (ref <150)

## 2021-07-17 LAB — COMPREHENSIVE METABOLIC PANEL
ALT: 13 U/L (ref 0–44)
AST: 15 U/L (ref 15–41)
Albumin: 2 g/dL — ABNORMAL LOW (ref 3.5–5.0)
Alkaline Phosphatase: 49 U/L (ref 38–126)
Anion gap: 10 (ref 5–15)
BUN: 14 mg/dL (ref 6–20)
CO2: 28 mmol/L (ref 22–32)
Calcium: 8 mg/dL — ABNORMAL LOW (ref 8.9–10.3)
Chloride: 98 mmol/L (ref 98–111)
Creatinine, Ser: 0.75 mg/dL (ref 0.61–1.24)
GFR, Estimated: >60 mL/min (ref >60)
Glucose, Bld: 144 mg/dL — ABNORMAL HIGH (ref 70–99)
Potassium: 3.4 mmol/L — ABNORMAL LOW (ref 3.5–5.1)
Sodium: 136 mmol/L (ref 135–145)
Total Bilirubin: 0.3 mg/dL (ref 0.3–1.2)
Total Protein: 6 g/dL — ABNORMAL LOW (ref 6.5–8.1)

## 2021-07-17 LAB — PHOSPHORUS: Phosphorus: 3.7 mg/dL (ref 2.5–4.6)

## 2021-07-17 LAB — CBC
HCT: 37.4 % — ABNORMAL LOW (ref 39.0–52.0)
Hemoglobin: 12.1 g/dL — ABNORMAL LOW (ref 13.0–17.0)
MCH: 29.2 pg (ref 26.0–34.0)
MCHC: 32.4 g/dL (ref 30.0–36.0)
MCV: 90.3 fL (ref 80.0–100.0)
Platelets: 341 10*3/uL (ref 150–400)
RBC: 4.14 MIL/uL — ABNORMAL LOW (ref 4.22–5.81)
RDW: 13.2 % (ref 11.5–15.5)
WBC: 22.1 10*3/uL — ABNORMAL HIGH (ref 4.0–10.5)
nRBC: 0 % (ref 0.0–0.2)

## 2021-07-17 MED ORDER — TRAVASOL 10 % IV SOLN
INTRAVENOUS | Status: AC
  Filled 2021-07-17: qty 999.6

## 2021-07-17 MED ORDER — IBUPROFEN 600 MG PO TABS: 600.0000 mg | ORAL_TABLET | Freq: Once | ORAL | Status: DC

## 2021-07-17 MED ORDER — POTASSIUM CHLORIDE 10 MEQ/50ML IV SOLN
10.0000 meq | INTRAVENOUS | Status: AC
Start: 2021-07-17 — End: 2021-07-17
  Administered 2021-07-17 (×4): 10 meq via INTRAVENOUS
  Filled 2021-07-17 (×4): qty 50

## 2021-07-17 MED ORDER — IOHEXOL 350 MG/ML SOLN
75.0000 mL | Freq: Once | INTRAVENOUS | Status: AC | PRN
  Administered 2021-07-17: 75 mL via INTRAVENOUS

## 2021-07-17 MED ORDER — IOHEXOL 9 MG/ML PO SOLN
ORAL | Status: AC
  Filled 2021-07-17: qty 1000

## 2021-07-17 MED ORDER — IBUPROFEN 100 MG/5ML PO SUSP
600.0000 mg | Freq: Once | ORAL | Status: AC
  Administered 2021-07-17: 600 mg via NASOGASTRIC
  Filled 2021-07-17: qty 30

## 2021-07-17 NOTE — Plan of Care (Signed)

## 2021-07-17 NOTE — Progress Notes (Signed)
PHARMACY - TOTAL PARENTERAL NUTRITION CONSULT NOTE   Indication: Prolonged ileus  Patient Measurements: Height: 5\' 9"  (175.3 cm) Weight: 59 kg (130 lb) IBW/kg (Calculated) : 70.7 TPN AdjBW (KG): 59 Body mass index is 19.2 kg/m. Usual Weight: 140 lbs  Assessment: 50 yo male presented on 07/10/2021 with abdominal pain, nausea, and vomiting for 3 days prior. PMH significant for polysubstance abuse, homelessness, bipolar disorder, SBO resulting in exlap, LOA and appendectomy at [facility] in May 2020. Patient reported one SBO since that was treated with NGT and improved. Pharmacy consulted to start TPN for SBO.   Glucose / Insulin: no hx DM. A1C 5.3 in April 2020. Bmet glucose previously controlled off TPN. CBGs 87-129. 5 units insulin pver the past 24hr (received dextrose containing fluids for electrolyte replacement on 10/2) Electrolytes: Na 136, K 3.4, Phos 3.7 (s/p Kphos 20 mmol on 10/2), Mg 2.0. Other electrolytes wnl.  - previous hyperkalemia with MIVF w/ Kcl (~30 mEq/day) Renal: Scr 0.75, no documented UOP, BUN wnl Hepatic: LFTs wnl, t bili wnl. Albumin 2.0. TG 63 Intake / Output; MIVF: NS @65  ml/hr. NG 3600 ml. UOP none documented GI Imaging: - 9/26 xray abd: concerning for SBO - 9/26 CT abd: high grade partial SBO, suspected internal hernia vs. Complex adhesions in RLQ - 9/27 xray abd: continued SBO - 9/28 xray abd: multiple gas filled dilated loops similar to CT GI Surgeries / Procedures: - 9/28 exlap, LOA, small intestine repair x2  Central access: PICC placed 9/30 TPN start date: 9/30  Nutritional Goals: Goal TPN rate is 85 mL/hr (provides 100 g of protein and 2052 kcals per day)  RD Assessment:  Estimated Needs Total Energy Estimated Needs: 2000-2200 Total Protein Estimated Needs: 90-110 grams Total Fluid Estimated Needs: >/= 2.0 L  Current Nutrition:  NPO  TPN to start 9/30  Plan: **Patient pulled out NGT overnight. Has now been replaced** **Fever overnight of  102.5. CTM for signs/symptoms of infection**   Continue TPN  to goal rate of 73mL/hr at 1800 which will provide 71g AA and 1449 kcal Electrolytes in TPN: Na 60mEq/L, Increase K 91mEq/L (~80 mEq/day), Ca 37mEq/L, Increase Mg 67mEq/L, and Phos 76mmol/L (consider adjustment tomorrow-repeat phos ordered). Cl:Ac 1:1 Kcl IV 4m x4 today Add standard MVI and trace elements to TPN Add thiamine 100mg  x 7 days for refeeding risk per RD (ends 10/6) Continue Sensitive q4h SSI and adjust as needed  Continue MIVF at 65 mL/hr based on high NG output ~3956ml/24hr Monitor TPN labs on Mon/Thurs  Thank you for allowing pharmacy to be a part of this patient's care.  , PharmD Clinical Pharmacist  Please check AMION for all [facility] Pharmacy numbers After 10:00 PM, call Main Pharmacy [phone]

## 2021-07-17 NOTE — Progress Notes (Signed)
CC surgery MD on call notified that patient had a temp of 102.5 HR 113 and RED MEWS. New orders received awaiting pharmacy to verify. Removed the multiple blankets off the bed and placed ice packs on patient. Informed on coming RN that medication is waiting to be verified

## 2021-07-17 NOTE — Progress Notes (Signed)
NT attempted 5x to get BS no success, Notified CCS MD

## 2021-07-17 NOTE — Progress Notes (Addendum)
5 Days Post-Op   Chief Complaint/Subjective: Cc fever. Denies chills or night sweats. Reports dysuria. Denies SOB.  Does endorse flatus and one BM yesterday. States he has only been out of been once since OR, to have a BM. Denies using chloraseptic spray. Denies drinking any water/liquids. Pt requesting that I update his niece on his status.   Objective: Vital signs in last 24 hours: Temp:  [98.4 F (36.9 C)-102.5 F (39.2 C)] 98.5 F (36.9 C) (10/03 0805) Pulse Rate:  [95-113] 108 (10/03 0805) Resp:  [14-18] 14 (10/03 0805) BP: (111-126)/(69-87) 111/69 (10/03 0805) SpO2:  [96 %-100 %] 98 % (10/03 0805) Last BM Date: 07/16/21 Intake/Output from previous day: 10/02 0701 - 10/03 0700 In: 2848.2 [I.V.:2548.2; IV Piggyback:300] Out: 3900 [Emesis/NG output:3900] Intake/Output this shift: No intake/output data recorded.  PE: Gen: NAD Resp: nonlabored, CTAB anterior chest wall Card: RRR Abd: soft, appropriately tender, distended, midline incision with staples c/d/I (honeycomb removed), hypoactive BS  NG - 3900 cc/24h, dark and bilious  Lab Results:  Recent Labs    07/15/21 0235  WBC 6.7  HGB 11.7*  HCT 35.8*  PLT 253   BMET Recent Labs    07/16/21 0314 07/17/21 0333  NA 136 136  K 3.4* 3.4*  CL 102 98  CO2 27 28  GLUCOSE 129* 144*  BUN 13 14  CREATININE 0.68 0.75  CALCIUM 7.9* 8.0*   PT/INR No results for input(s): LABPROT, INR in the last 72 hours. CMP     Component Value Date/Time   NA 136 07/17/2021 0333   K 3.4 (L) 07/17/2021 0333   CL 98 07/17/2021 0333   CO2 28 07/17/2021 0333   GLUCOSE 144 (H) 07/17/2021 0333   BUN 14 07/17/2021 0333   CREATININE 0.75 07/17/2021 0333   CALCIUM 8.0 (L) 07/17/2021 0333   PROT 6.0 (L) 07/17/2021 0333   ALBUMIN 2.0 (L) 07/17/2021 0333   AST 15 07/17/2021 0333   ALT 13 07/17/2021 0333   ALKPHOS 49 07/17/2021 0333   BILITOT 0.3 07/17/2021 0333   GFRNONAA >60 07/17/2021 0333   GFRAA >60 09/05/2019 2005   Lipase      Component Value Date/Time   LIPASE 40 07/10/2021 1008    Studies/Results: DG Abd Portable 1V  Result Date: 07/16/2021 CLINICAL DATA:  Check gastric catheter placement EXAM: PORTABLE ABDOMEN - 1 VIEW COMPARISON:  07/12/2021 FINDINGS: Gastric catheter is noted within the stomach. Scattered large and small bowel gas is noted. Small-bowel dilatation is seen consistent with the known small bowel obstruction. No free air is noted. IMPRESSION: Gastric catheter within the stomach. Persistent small bowel dilatation is noted. Electronically Signed   By: Alcide Clever M.D.   On: 07/16/2021 22:35    Anti-infectives: Anti-infectives (From admission, onward)    Start     Dose/Rate Route Frequency Ordered Stop   07/12/21 1200  ceFAZolin (ANCEF) IVPB 2g/100 mL premix        2 g 200 mL/hr over 30 Minutes Intravenous To Short Stay 07/12/21 0843 07/12/21 1100       Assessment/Plan POD 5, S/P : EXPLORATORY LAPAROTOMY; LYSIS OF ADHESIONS; SMALL INTESTINE REPAIR X 2 07/12/21 Dr. Sheliah Hatch for SBO - post-operative ileus, await return of bowel function - picc and TNA started 9/30 -multi-modal pain control working much better for patient - OOB, mobilize  - incentive spirometry q 1h  Fever - 102.5 @ 0645 10/3. Check UA (pt reported dysuria) and CXR. Monitor fever curve. May need repeat CT abdomen/pelvis in  next 48 hours for persistent ileus/recurrent fever.  FEN - NPO, NG to LIWS, PICC/TNA VTE - lovenox ID - ancef x1 dose 9/28 perioperatively Foley -  DC 9/30 Disposition - med-surg, post-op ileus, await return of bowel function  AKI - resolved  Bipolar disorder Homelessness Polysubstance abuse  Low UOP - resolved   LOS: 7 days   [facility] 07/17/2021, 8:38 AM Please see Amion for pager number during day hours 7:00am-4:30pm or 7:00am -11:30am on weekends

## 2021-07-17 NOTE — Progress Notes (Signed)
At the beginning of shift when assessing the patient he was holding and tugging on the NGT I asked him to stop because we would have to replace it. Later he called and said the tube fell out his nose I told him there is no way this tube just fell out of his nose since it's all the way in the stomach and you were pulling at the beginning of the shift and I asked you to stop and you're holding the tube in your hand now. He got upset and turned his head away from me, I asked Celso to help replace the and abdominal x-ray was done to confirm placement, Morphine 2 mg IV and Zofran 4 mg IV was given

## 2021-07-18 ENCOUNTER — Encounter (HOSPITAL_COMMUNITY): Payer: Self-pay

## 2021-07-18 ENCOUNTER — Inpatient Hospital Stay (HOSPITAL_COMMUNITY): Payer: Self-pay

## 2021-07-18 LAB — CBC
HCT: 37 % — ABNORMAL LOW (ref 39.0–52.0)
Hemoglobin: 12.1 g/dL — ABNORMAL LOW (ref 13.0–17.0)
MCH: 29.5 pg (ref 26.0–34.0)
MCHC: 32.7 g/dL (ref 30.0–36.0)
MCV: 90.2 fL (ref 80.0–100.0)
Platelets: 359 10*3/uL (ref 150–400)
RBC: 4.1 MIL/uL — ABNORMAL LOW (ref 4.22–5.81)
RDW: 13.2 % (ref 11.5–15.5)
WBC: 25.2 10*3/uL — ABNORMAL HIGH (ref 4.0–10.5)
nRBC: 0 % (ref 0.0–0.2)

## 2021-07-18 LAB — GLUCOSE, CAPILLARY
Glucose-Capillary: 119 mg/dL — ABNORMAL HIGH (ref 70–99)
Glucose-Capillary: 119 mg/dL — ABNORMAL HIGH (ref 70–99)
Glucose-Capillary: 124 mg/dL — ABNORMAL HIGH (ref 70–99)
Glucose-Capillary: 136 mg/dL — ABNORMAL HIGH (ref 70–99)
Glucose-Capillary: 137 mg/dL — ABNORMAL HIGH (ref 70–99)
Glucose-Capillary: 98 mg/dL (ref 70–99)

## 2021-07-18 LAB — PHOSPHORUS: Phosphorus: 3 mg/dL (ref 2.5–4.6)

## 2021-07-18 LAB — PROTIME-INR
INR: 1.1 (ref 0.8–1.2)
Prothrombin Time: 14.1 seconds (ref 11.4–15.2)

## 2021-07-18 MED ORDER — SODIUM CHLORIDE 0.9% FLUSH
5.0000 mL | Freq: Three times a day (TID) | INTRAVENOUS | Status: DC
  Administered 2021-07-18 – 2021-07-19 (×3): 5 mL

## 2021-07-18 MED ORDER — LIDOCAINE HCL 1 % IJ SOLN
INTRAMUSCULAR | Status: AC
  Filled 2021-07-18: qty 10

## 2021-07-18 MED ORDER — ENOXAPARIN SODIUM 40 MG/0.4ML IJ SOSY
40.0000 mg | PREFILLED_SYRINGE | INTRAMUSCULAR | Status: DC
  Administered 2021-07-18: 40 mg via SUBCUTANEOUS
  Filled 2021-07-18: qty 0.4

## 2021-07-18 MED ORDER — HYDROCODONE-ACETAMINOPHEN 5-325 MG PO TABS: 1.0000 | ORAL_TABLET | ORAL | Status: DC | PRN

## 2021-07-18 MED ORDER — KETOROLAC TROMETHAMINE 15 MG/ML IJ SOLN
15.0000 mg | Freq: Four times a day (QID) | INTRAMUSCULAR | Status: AC
  Administered 2021-07-18 – 2021-07-21 (×11): 15 mg via INTRAVENOUS
  Filled 2021-07-18 (×11): qty 1

## 2021-07-18 MED ORDER — MIDAZOLAM HCL 2 MG/2ML IJ SOLN
INTRAMUSCULAR | Status: AC
  Filled 2021-07-18: qty 4

## 2021-07-18 MED ORDER — MIDAZOLAM HCL 2 MG/2ML IJ SOLN
INTRAMUSCULAR | Status: DC | PRN
  Administered 2021-07-18: 1 mg via INTRAVENOUS

## 2021-07-18 MED ORDER — FENTANYL CITRATE (PF) 100 MCG/2ML IJ SOLN
INTRAMUSCULAR | Status: AC
  Filled 2021-07-18: qty 4

## 2021-07-18 MED ORDER — THIAMINE HCL 100 MG/ML IJ SOLN
INTRAMUSCULAR | Status: AC
  Filled 2021-07-18: qty 999.6

## 2021-07-18 MED ORDER — FENTANYL CITRATE (PF) 100 MCG/2ML IJ SOLN
INTRAMUSCULAR | Status: DC | PRN
  Administered 2021-07-18: 50 ug via INTRAVENOUS

## 2021-07-18 MED ORDER — PIPERACILLIN-TAZOBACTAM 3.375 G IVPB
3.3750 g | Freq: Three times a day (TID) | INTRAVENOUS | Status: DC
  Administered 2021-07-18 – 2021-08-03 (×48): 3.375 g via INTRAVENOUS
  Filled 2021-07-18 (×49): qty 50

## 2021-07-18 NOTE — Plan of Care (Addendum)
  Problem: Education: Goal: Knowledge of General Education information will improve Description: Including pain rating scale, medication(s)/side effects and non-pharmacologic comfort measures Outcome: Progressing   Problem: Health Behavior/Discharge Planning: Goal: Ability to manage health-related needs will improve Outcome: Progressing   Problem: Clinical Measurements: Goal: Ability to maintain clinical measurements within normal limits will improve Outcome: Progressing Goal: Will remain free from infection Outcome: Not Progressing Goal: Diagnostic test results will improve Outcome: Not Progressing   Problem: Activity: Goal: Risk for activity intolerance will decrease Outcome: Progressing   Problem: Nutrition: Goal: Adequate nutrition will be maintained Outcome: Not Progressing   Problem: Coping: Goal: Level of anxiety will decrease Outcome: Progressing   Problem: Elimination: Goal: Will not experience complications related to bowel motility Outcome: Not Progressing Goal: Will not experience complications related to urinary retention Outcome: Progressing   Problem: Pain Managment: Goal: General experience of comfort will improve Outcome: Progressing   Problem: Safety: Goal: Ability to remain free from injury will improve Outcome: Progressing   Problem: Skin Integrity: Goal: Risk for impaired skin integrity will decrease Outcome: Progressing   Report received and care assumed from previous shift RN. Progressing towards goals as outlined above. VS obtained, shift assessments completed - see flowsheets. PRN and scheduled pain medications given as needed for c/o mid/lower abdominal pain - see MAR; patient denies need for further intervention. Midline abdominal incision OTA with staples intact, edges well approximated, no drainage noted. NGT to L nare patent and intact to LIWS as ordered, Dr. Gaynelle Adu notified at 2310 about increased drainage from NGT, no new orders at  this time. Maintained NPO at all times this shift. Patient with several loose, diarrhea stools this shift - peri care provided. External urinary catheter placed for skin breakdown prevention and episode of urge incontinence. Patient currently resting in bed, bed in lowest position. Denies needs. Call bell within reach.

## 2021-07-18 NOTE — Progress Notes (Addendum)
PHARMACY - TOTAL PARENTERAL NUTRITION CONSULT NOTE   Indication: Prolonged ileus  Patient Measurements: Height: 5\' 9"  (175.3 cm) Weight: 59 kg (130 lb) IBW/kg (Calculated) : 70.7 TPN AdjBW (KG): 59 Body mass index is 19.2 kg/m. Usual Weight: 140 lbs  Assessment: 50 yo male presented on 07/10/2021 with abdominal pain, nausea, and vomiting for 3 days prior. PMH significant for polysubstance abuse, homelessness, bipolar disorder, SBO resulting in exlap, LOA and appendectomy at [facility] in May 2020. Patient reported one SBO since that was treated with NGT and improved. Pharmacy consulted to start TPN for SBO.   Glucose / Insulin: no hx DM. A1C 5.3 in April 2020. Bmet glucose previously controlled off TPN. CBGs 110-150. 4 units insulin pver the past 24hr Electrolytes: Na 136, K 3.4, Phos 3.7>>3.0 (s/p Kphos 20 mmol on 10/2), Mg 2.0. Other electrolytes wnl.  - previous hyperkalemia with MIVF w/ Kcl (~30 mEq/day) Renal: Scr 0.75, no documented UOP, BUN wnl Hepatic: LFTs wnl, t bili wnl. Albumin 2.0. TG 63 Intake / Output; MIVF: NS @65 >>100 ml/hr. NG 3900 ml. UOP none documented GI Imaging: - 9/26 xray abd: concerning for SBO - 9/26 CT abd: high grade partial SBO, suspected internal hernia vs. Complex adhesions in RLQ - 9/27 xray abd: continued SBO - 9/28 xray abd: multiple gas filled dilated loops similar to CT -10/4 CT abdomen/pelvis: large dumbbell-shaped pelvic abscess GI Surgeries / Procedures: - 9/28 exlap, LOA, small intestine repair x2  Central access: PICC placed 9/30 TPN start date: 9/30  Nutritional Goals: Goal TPN rate is 85 mL/hr (provides 100 g of protein and 2052 kcals per day)  RD Assessment:  Estimated Needs Total Energy Estimated Needs: 2000-2200 Total Protein Estimated Needs: 90-110 grams Total Fluid Estimated Needs: >/= 2.0 L  Current Nutrition:  NPO  TPN to start 9/30  Plan: **fever has resolved, but 10/4 CT abdomen/pelvis showed large dumbbell-shaped  pelvic abscess>>patient potentially going for drain today**  Continue TPN  to goal rate of 73mL/hr at 1800 which will provide 71g AA and 1449 kcal Electrolytes in TPN: Na 4mEq/L, K 41mEq/L (~80 mEq/day), Ca 61mEq/L, Mg 71mEq/L, and Phos 18mmol/L. Cl:Ac 1:1 Add standard MVI and trace elements to TPN Add thiamine 100mg  x 7 days for refeeding risk per RD (ends 10/6) Continue Sensitive q4h SSI and adjust as needed  MIVF increase from 65 to 100 mL/hr based on high NG output ~3985ml/24hr per primary team Monitor TPN labs on Mon/Thurs  Thank you for allowing pharmacy to be a part of this patient's care.  12m, PharmD Clinical Pharmacist  Please check AMION for all [facility] Pharmacy numbers After 10:00 PM, call Main Pharmacy [phone]

## 2021-07-18 NOTE — Progress Notes (Signed)
6 Days Post-Op   Chief Complaint/Subjective: Cc incontinence of stool Having frequent liquid BMs. No flatus. High output NGT. Pain overall controlled. Not really getting out of bed.   Afebrile, HR 115 bpm  Objective: Vital signs in last 24 hours: Temp:  [97.6 F (36.4 C)-99.4 F (37.4 C)] 98.4 F (36.9 C) (10/04 0805) Pulse Rate:  [78-117] 78 (10/04 0805) Resp:  [16-20] 19 (10/04 0805) BP: (113-129)/(74-87) 118/80 (10/04 0805) SpO2:  [96 %-99 %] 98 % (10/04 0805) Last BM Date: 07/16/21 Intake/Output from previous day: 10/03 0701 - 10/04 0700 In: 5156.9 [I.V.:4296.9; NG/GT:160; IV Piggyback:700] Out: 3300 [Urine:300; Emesis/NG output:3000] Intake/Output this shift: No intake/output data recorded.  PE: Gen: NAD Resp: nonlabored, CTAB anterior chest wall Card: RRR Abd: soft, appropriately tender, distended, midline incision with staples c/d/I, hypoactive BS  NG - 3000 cc/24h, dark and bilious  Lab Results:  Recent Labs    07/17/21 1119 07/18/21 0823  WBC 22.1* 25.2*  HGB 12.1* 12.1*  HCT 37.4* 37.0*  PLT 341 359   BMET Recent Labs    07/16/21 0314 07/17/21 0333  NA 136 136  K 3.4* 3.4*  CL 102 98  CO2 27 28  GLUCOSE 129* 144*  BUN 13 14  CREATININE 0.68 0.75  CALCIUM 7.9* 8.0*   PT/INR Recent Labs    07/18/21 0823  LABPROT 14.1  INR 1.1   CMP     Component Value Date/Time   NA 136 07/17/2021 0333   K 3.4 (L) 07/17/2021 0333   CL 98 07/17/2021 0333   CO2 28 07/17/2021 0333   GLUCOSE 144 (H) 07/17/2021 0333   BUN 14 07/17/2021 0333   CREATININE 0.75 07/17/2021 0333   CALCIUM 8.0 (L) 07/17/2021 0333   PROT 6.0 (L) 07/17/2021 0333   ALBUMIN 2.0 (L) 07/17/2021 0333   AST 15 07/17/2021 0333   ALT 13 07/17/2021 0333   ALKPHOS 49 07/17/2021 0333   BILITOT 0.3 07/17/2021 0333   GFRNONAA >60 07/17/2021 0333   GFRAA >60 09/05/2019 2005   Lipase     Component Value Date/Time   LIPASE 40 07/10/2021 1008    Studies/Results: CT ABDOMEN PELVIS  W CONTRAST  Result Date: 07/17/2021 CLINICAL DATA:  Laparotomy 07/12/2021 for small-bowel obstruction and lysis of adhesions. Abdominal pain. EXAM: CT ABDOMEN AND PELVIS WITH CONTRAST TECHNIQUE: Multidetector CT imaging of the abdomen and pelvis was performed using the standard protocol following bolus administration of intravenous contrast. CONTRAST:  40mL OMNIPAQUE IOHEXOL 350 MG/ML SOLN COMPARISON:  CT 07/10/2021 (preop) FINDINGS: Lower chest: Atelectasis in the RIGHT lower lobe.  No pleural fluid. Hepatobiliary: Saw intraperitoneal free air anterior to the RIGHT hepatic lobe beneath the RIGHT hemidiaphragm related to recent open surgery. No focal hepatic lesion. Gallbladder normal. Pancreas: The head and body of the pancreas appear normal. The tail is not well defined (image 31/3. Spleen: Normal spleen Adrenals/urinary tract: Adrenal glands normal. The kidneys enhance symmetrically. Ureters bladder normal Stomach/Bowel: NG tube in the stomach. Large volume of oral contrast remains in the stomach. Versus second portion the duodenum are dilated (image 31/3. There is decreased distention of the mid small bowel compared to prior. The appendix is not identified. Terminal ileum appears normal. There is fluid stool in the ascending and transverse colon. Descending colon is collapsed. There is a large fluid collection in the pelvis anterior to the rectum and extending superior to the bladder and anteriorly to the ventral peritoneal space. This fluid collection is dumbbell-shaped and has a thin enhancing  rim. There is also gas within the collection. The dumbbell shaped collection measures 12.5 x 7.3 cm (image 79/3.) An "ear" of the abscess extends toward the RIGHT iliac vessels (image 77/3). Vascular/Lymphatic: Abdominal aorta is normal caliber. No periportal or retroperitoneal adenopathy. No pelvic adenopathy. Reproductive: Prostate unremarkable. Other: Large pelvic and lower abdominal abscess described in the GI  section. Musculoskeletal: No aggressive osseous lesion. IMPRESSION: 1. Large dumbbell-shaped pelvic abscess. 2. Improvement in bowel obstruction pattern seen on comparison exam; however, there stasis of enteric contents and bowel dilatation suggesting ileus. Fluid stool throughout the colon. Mildly dilated small bowel. Multiple air-fluid levels. 3. NG tube in stomach. 4. Tail of the pancreas is poorly evaluated however pancreatitis is not favored. These results will be called to the ordering clinician or representative by the Radiologist Assistant, and communication documented in the PACS or [employer]. Electronically Signed   By: Genevive Bi M.D.   On: 07/17/2021 20:44   DG CHEST PORT 1 VIEW  Result Date: 07/17/2021 CLINICAL DATA:  Fever EXAM: PORTABLE CHEST 1 VIEW COMPARISON:  09/05/2019 FINDINGS: Nasogastric tube enters the stomach. Right arm PICC tip in the proximal right atrium. The left lung is clear. There is volume loss at the right lung base. IMPRESSION: Right lower lobe volume loss. Electronically Signed   By: Paulina Fusi M.D.   On: 07/17/2021 11:58   DG Abd Portable 1V  Result Date: 07/16/2021 CLINICAL DATA:  Check gastric catheter placement EXAM: PORTABLE ABDOMEN - 1 VIEW COMPARISON:  07/12/2021 FINDINGS: Gastric catheter is noted within the stomach. Scattered large and small bowel gas is noted. Small-bowel dilatation is seen consistent with the known small bowel obstruction. No free air is noted. IMPRESSION: Gastric catheter within the stomach. Persistent small bowel dilatation is noted. Electronically Signed   By: Alcide Clever M.D.   On: 07/16/2021 22:35    Anti-infectives: Anti-infectives (From admission, onward)    Start     Dose/Rate Route Frequency Ordered Stop   07/18/21 1000  piperacillin-tazobactam (ZOSYN) IVPB 3.375 g        3.375 g 12.5 mL/hr over 240 Minutes Intravenous Every 8 hours 07/18/21 0839     07/12/21 1200  ceFAZolin (ANCEF) IVPB 2g/100 mL premix         2 g 200 mL/hr over 30 Minutes Intravenous To Short Stay 07/12/21 0843 07/12/21 1100       Assessment/Plan POD 6, S/P : EXPLORATORY LAPAROTOMY; LYSIS OF ADHESIONS; SMALL INTESTINE REPAIR X 2 07/12/21 Dr. Sheliah Hatch for SBO - afebrile, sinus tach 101-117 bpm, normotensive, WBC yesterday 22, CBC in AM pending - CT A/P 10/3 with large pelvic fluid collection, consult IR for drainage and start empiric zosyn - post-operative ileus, await return of bowel function - picc and TNA started 9/30 -multi-modal pain control working much better for patient - OOB, mobilize  - incentive spirometry q 1h  Fever- last  fever was 10/3. CXR and UA negative 10/3. CT A/P 10/3 with pelvic fluid collection.   FEN - NPO, NG to LIWS, PICC/TNA VTE - lovenox ID - ancef x1 dose 9/28 perioperatively, Zosyn 10/4 >> Foley -  DC 9/30 Disposition - med-surg, IV abx, IR consult  AKI - resolved  Bipolar disorder Homelessness Polysubstance abuse  Low UOP - resolved   LOS: 8 days   Francine Graven [facility] Surgery 07/18/2021, 10:33 AM Please see Amion for pager number during day hours 7:00am-4:30pm or 7:00am -11:30am on weekends

## 2021-07-18 NOTE — Progress Notes (Signed)
Bailey Mech, PA notified of NGT output overnight and tachycardia. No new orders at this time.

## 2021-07-18 NOTE — Procedures (Signed)
  Procedure: CT LLQ pelvic abscess drain catheter placement 50f EBL:   minimal Complications:  none immediate  See full dictation in [employer].  Thora Lance MD Main # [phone] Pager  [phone]

## 2021-07-18 NOTE — Sedation Documentation (Signed)
Vital signs stable. Procedure started 
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
## 2021-07-18 NOTE — Sedation Documentation (Signed)
Vital signs stable. 

## 2021-07-18 NOTE — Sedation Documentation (Signed)
Patient is resting comfortably. 

## 2021-07-18 NOTE — Sedation Documentation (Signed)
Report called to floor RN. Pt vitals stable.  Totals: Time 16 mins Fentanyl Versed 1 mg

## 2021-07-19 ENCOUNTER — Inpatient Hospital Stay (HOSPITAL_COMMUNITY): Payer: Self-pay | Admitting: Anesthesiology

## 2021-07-19 ENCOUNTER — Encounter (HOSPITAL_COMMUNITY): Payer: Self-pay

## 2021-07-19 ENCOUNTER — Encounter (HOSPITAL_COMMUNITY): Admission: EM | Disposition: A | Discharge status: Home / Self Care | Payer: Self-pay | Source: Home / Self Care

## 2021-07-19 HISTORY — PX: LAPAROTOMY: SHX154

## 2021-07-19 LAB — GLUCOSE, CAPILLARY
Glucose-Capillary: 106 mg/dL — ABNORMAL HIGH (ref 70–99)
Glucose-Capillary: 129 mg/dL — ABNORMAL HIGH (ref 70–99)
Glucose-Capillary: 132 mg/dL — ABNORMAL HIGH (ref 70–99)
Glucose-Capillary: 132 mg/dL — ABNORMAL HIGH (ref 70–99)
Glucose-Capillary: 146 mg/dL — ABNORMAL HIGH (ref 70–99)
Glucose-Capillary: 148 mg/dL — ABNORMAL HIGH (ref 70–99)

## 2021-07-19 LAB — CBC
HCT: 33.7 % — ABNORMAL LOW (ref 39.0–52.0)
Hemoglobin: 10.9 g/dL — ABNORMAL LOW (ref 13.0–17.0)
MCH: 29.4 pg (ref 26.0–34.0)
MCHC: 32.3 g/dL (ref 30.0–36.0)
MCV: 90.8 fL (ref 80.0–100.0)
Platelets: 419 10*3/uL — ABNORMAL HIGH (ref 150–400)
RBC: 3.71 MIL/uL — ABNORMAL LOW (ref 4.22–5.81)
RDW: 13.2 % (ref 11.5–15.5)
WBC: 17 10*3/uL — ABNORMAL HIGH (ref 4.0–10.5)
nRBC: 0 % (ref 0.0–0.2)

## 2021-07-19 LAB — BASIC METABOLIC PANEL
Anion gap: 7 (ref 5–15)
BUN: 20 mg/dL (ref 6–20)
CO2: 30 mmol/L (ref 22–32)
Calcium: 8 mg/dL — ABNORMAL LOW (ref 8.9–10.3)
Chloride: 101 mmol/L (ref 98–111)
Creatinine, Ser: 0.72 mg/dL (ref 0.61–1.24)
GFR, Estimated: >60 mL/min (ref >60)
Glucose, Bld: 131 mg/dL — ABNORMAL HIGH (ref 70–99)
Potassium: 3.4 mmol/L — ABNORMAL LOW (ref 3.5–5.1)
Sodium: 138 mmol/L (ref 135–145)

## 2021-07-19 LAB — TYPE AND SCREEN
ABO/RH(D): A POS
Antibody Screen: NEGATIVE

## 2021-07-19 LAB — PHOSPHORUS: Phosphorus: 4.1 mg/dL (ref 2.5–4.6)

## 2021-07-19 LAB — MAGNESIUM: Magnesium: 2.3 mg/dL (ref 1.7–2.4)

## 2021-07-19 SURGERY — LAPAROTOMY, EXPLORATORY
Anesthesia: General | Site: Abdomen

## 2021-07-19 MED ORDER — ENOXAPARIN SODIUM 40 MG/0.4ML IJ SOSY
40.0000 mg | PREFILLED_SYRINGE | INTRAMUSCULAR | Status: DC
  Administered 2021-07-20 – 2021-07-28 (×7): 40 mg via SUBCUTANEOUS
  Filled 2021-07-19 (×11): qty 0.4

## 2021-07-19 MED ORDER — 0.9 % SODIUM CHLORIDE (POUR BTL) OPTIME
TOPICAL | Status: DC | PRN
  Administered 2021-07-19: 3000 mL

## 2021-07-19 MED ORDER — FENTANYL CITRATE (PF) 100 MCG/2ML IJ SOLN
25.0000 ug | INTRAMUSCULAR | Status: DC | PRN
  Administered 2021-07-19 (×3): 50 ug via INTRAVENOUS

## 2021-07-19 MED ORDER — POTASSIUM CHLORIDE 10 MEQ/50ML IV SOLN
10.0000 meq | INTRAVENOUS | Status: AC
  Filled 2021-07-19 (×5): qty 50

## 2021-07-19 MED ORDER — SUGAMMADEX SODIUM 200 MG/2ML IV SOLN
INTRAVENOUS | Status: DC | PRN
  Administered 2021-07-19 (×2): 100 mg via INTRAVENOUS

## 2021-07-19 MED ORDER — SUCCINYLCHOLINE CHLORIDE 200 MG/10ML IV SOSY
PREFILLED_SYRINGE | INTRAVENOUS | Status: DC | PRN
  Administered 2021-07-19: 120 mg via INTRAVENOUS

## 2021-07-19 MED ORDER — ROCURONIUM BROMIDE 10 MG/ML (PF) SYRINGE
PREFILLED_SYRINGE | INTRAVENOUS | Status: DC | PRN
  Administered 2021-07-19: 50 mg via INTRAVENOUS
  Administered 2021-07-19: 10 mg via INTRAVENOUS

## 2021-07-19 MED ORDER — LACTATED RINGERS IV SOLN: INTRAVENOUS | Status: DC

## 2021-07-19 MED ORDER — FENTANYL CITRATE (PF) 250 MCG/5ML IJ SOLN
INTRAMUSCULAR | Status: AC
  Filled 2021-07-19: qty 5

## 2021-07-19 MED ORDER — FENTANYL CITRATE (PF) 100 MCG/2ML IJ SOLN
INTRAMUSCULAR | Status: AC
  Filled 2021-07-19: qty 2

## 2021-07-19 MED ORDER — MIDAZOLAM HCL 2 MG/2ML IJ SOLN
INTRAMUSCULAR | Status: AC
  Filled 2021-07-19: qty 2

## 2021-07-19 MED ORDER — OXYCODONE HCL 5 MG/5ML PO SOLN: 5.0000 mg | Freq: Once | ORAL | Status: DC | PRN

## 2021-07-19 MED ORDER — FENTANYL CITRATE (PF) 250 MCG/5ML IJ SOLN
INTRAMUSCULAR | Status: DC | PRN
Start: 2021-07-19 — End: 2021-07-19
  Administered 2021-07-19 (×2): 50 ug via INTRAVENOUS
  Administered 2021-07-19: 100 ug via INTRAVENOUS
  Administered 2021-07-19: 50 ug via INTRAVENOUS

## 2021-07-19 MED ORDER — LIDOCAINE 2% (20 MG/ML) 5 ML SYRINGE
INTRAMUSCULAR | Status: DC | PRN
  Administered 2021-07-19: 60 mg via INTRAVENOUS

## 2021-07-19 MED ORDER — DEXAMETHASONE SODIUM PHOSPHATE 10 MG/ML IJ SOLN
INTRAMUSCULAR | Status: DC | PRN
  Administered 2021-07-19: 5 mg via INTRAVENOUS

## 2021-07-19 MED ORDER — ONDANSETRON HCL 4 MG/2ML IJ SOLN
INTRAMUSCULAR | Status: DC | PRN
  Administered 2021-07-19: 4 mg via INTRAVENOUS

## 2021-07-19 MED ORDER — MIDAZOLAM HCL 2 MG/2ML IJ SOLN
INTRAMUSCULAR | Status: DC | PRN
  Administered 2021-07-19: 2 mg via INTRAVENOUS

## 2021-07-19 MED ORDER — PROPOFOL 10 MG/ML IV BOLUS
INTRAVENOUS | Status: AC
  Filled 2021-07-19: qty 20

## 2021-07-19 MED ORDER — PROMETHAZINE HCL 25 MG/ML IJ SOLN: 6.2500 mg | INTRAMUSCULAR | Status: DC | PRN

## 2021-07-19 MED ORDER — ACETAMINOPHEN 10 MG/ML IV SOLN
1000.0000 mg | Freq: Four times a day (QID) | INTRAVENOUS | Status: AC
  Administered 2021-07-20 (×4): 1000 mg via INTRAVENOUS
  Filled 2021-07-19 (×4): qty 100

## 2021-07-19 MED ORDER — CHLORHEXIDINE GLUCONATE 0.12 % MT SOLN
15.0000 mL | Freq: Once | OROMUCOSAL | Status: AC
  Administered 2021-07-19: 15 mL via OROMUCOSAL

## 2021-07-19 MED ORDER — ORAL CARE MOUTH RINSE: 15.0000 mL | Freq: Once | OROMUCOSAL | Status: AC

## 2021-07-19 MED ORDER — OXYCODONE HCL 5 MG PO TABS: 5.0000 mg | ORAL_TABLET | Freq: Once | ORAL | Status: DC | PRN

## 2021-07-19 MED ORDER — DEXMEDETOMIDINE (PRECEDEX) IN NS 20 MCG/5ML (4 MCG/ML) IV SYRINGE
PREFILLED_SYRINGE | INTRAVENOUS | Status: DC | PRN
  Administered 2021-07-19: 8 ug via INTRAVENOUS

## 2021-07-19 MED ORDER — PHENYLEPHRINE 40 MCG/ML (10ML) SYRINGE FOR IV PUSH (FOR BLOOD PRESSURE SUPPORT)
PREFILLED_SYRINGE | INTRAVENOUS | Status: DC | PRN
  Administered 2021-07-19: 80 ug via INTRAVENOUS

## 2021-07-19 MED ORDER — PANTOPRAZOLE SODIUM 40 MG IV SOLR
40.0000 mg | Freq: Every day | INTRAVENOUS | Status: DC
  Administered 2021-07-19 – 2021-08-16 (×29): 40 mg via INTRAVENOUS
  Filled 2021-07-19 (×29): qty 40

## 2021-07-19 MED ORDER — PROPOFOL 10 MG/ML IV BOLUS
INTRAVENOUS | Status: DC | PRN
  Administered 2021-07-19: 160 mg via INTRAVENOUS

## 2021-07-19 MED ORDER — STERILE WATER FOR INJECTION IV SOLN
INTRAVENOUS | Status: AC
Start: 2021-07-19 — End: 2021-07-20
  Filled 2021-07-19: qty 999.6

## 2021-07-19 SURGICAL SUPPLY — 47 items
APL PRP STRL LF DISP 70% ISPRP (MISCELLANEOUS) ×1
BAG COUNTER SPONGE SURGICOUNT (BAG) ×2 IMPLANT
BAG SPNG CNTER NS LX DISP (BAG) ×1
BIOPATCH RED 1 DISK 7.0 (GAUZE/BANDAGES/DRESSINGS) ×1 IMPLANT
BLADE CLIPPER SURG (BLADE) IMPLANT
CANISTER SUCT 3000ML PPV (MISCELLANEOUS) ×2 IMPLANT
CHLORAPREP W/TINT 26 (MISCELLANEOUS) ×2 IMPLANT
COVER SURGICAL LIGHT HANDLE (MISCELLANEOUS) ×2 IMPLANT
DRAIN CHANNEL 19F RND (DRAIN) ×1 IMPLANT
DRAPE LAPAROSCOPIC ABDOMINAL (DRAPES) ×2 IMPLANT
DRAPE WARM FLUID 44X44 (DRAPES) ×2 IMPLANT
DRSG OPSITE POSTOP 4X10 (GAUZE/BANDAGES/DRESSINGS) IMPLANT
DRSG OPSITE POSTOP 4X8 (GAUZE/BANDAGES/DRESSINGS) IMPLANT
DRSG PAD ABDOMINAL 8X10 ST (GAUZE/BANDAGES/DRESSINGS) ×1 IMPLANT
DRSG TEGADERM 4X4.75 (GAUZE/BANDAGES/DRESSINGS) ×1 IMPLANT
ELECT BLADE 6.5 EXT (BLADE) IMPLANT
ELECT CAUTERY BLADE 6.4 (BLADE) ×2 IMPLANT
ELECT REM PT RETURN 9FT ADLT (ELECTROSURGICAL) ×2
ELECTRODE REM PT RTRN 9FT ADLT (ELECTROSURGICAL) ×1 IMPLANT
EVACUATOR SILICONE 100CC (DRAIN) ×1 IMPLANT
GAUZE SPONGE 4X4 12PLY STRL (GAUZE/BANDAGES/DRESSINGS) ×1 IMPLANT
GLOVE SURG ENC MOIS LTX SZ7.5 (GLOVE) ×2 IMPLANT
GOWN STRL REUS W/ TWL LRG LVL3 (GOWN DISPOSABLE) ×2 IMPLANT
GOWN STRL REUS W/TWL LRG LVL3 (GOWN DISPOSABLE) ×4
HANDLE SUCTION POOLE (INSTRUMENTS) ×1 IMPLANT
KIT BASIN OR (CUSTOM PROCEDURE TRAY) ×2 IMPLANT
KIT TURNOVER KIT B (KITS) ×2 IMPLANT
LIGASURE IMPACT 36 18CM CVD LR (INSTRUMENTS) IMPLANT
NS IRRIG 1000ML POUR BTL (IV SOLUTION) ×4 IMPLANT
PACK GENERAL/GYN (CUSTOM PROCEDURE TRAY) ×2 IMPLANT
PAD ARMBOARD 7.5X6 YLW CONV (MISCELLANEOUS) ×2 IMPLANT
PENCIL SMOKE EVACUATOR (MISCELLANEOUS) ×2 IMPLANT
SPECIMEN JAR LARGE (MISCELLANEOUS) IMPLANT
SPONGE T-LAP 18X18 ~~LOC~~+RFID (SPONGE) ×2 IMPLANT
STAPLER VISISTAT 35W (STAPLE) ×2 IMPLANT
SUCTION POOLE HANDLE (INSTRUMENTS) ×2
SUT ETHILON 2 0 FS 18 (SUTURE) ×1 IMPLANT
SUT PDS AB 1 TP1 96 (SUTURE) ×4 IMPLANT
SUT SILK 2 0 SH CR/8 (SUTURE) ×4 IMPLANT
SUT SILK 2 0 TIES 10X30 (SUTURE) ×2 IMPLANT
SUT SILK 3 0 SH CR/8 (SUTURE) ×2 IMPLANT
SUT SILK 3 0 TIES 10X30 (SUTURE) ×2 IMPLANT
SUT VIC AB 3-0 SH 18 (SUTURE) IMPLANT
TAPE CLOTH SURG 4X10 WHT LF (GAUZE/BANDAGES/DRESSINGS) ×1 IMPLANT
TOWEL GREEN STERILE (TOWEL DISPOSABLE) ×2 IMPLANT
TRAY FOLEY MTR SLVR 16FR STAT (SET/KITS/TRAYS/PACK) IMPLANT
YANKAUER SUCT BULB TIP NO VENT (SUCTIONS) IMPLANT

## 2021-07-19 NOTE — Transfer of Care (Signed)
Immediate Anesthesia Transfer of Care Note  Patient: Thomas [name]  Procedure(s) Performed: EXPLORATORY LAPAROTOMY SMALL BOWEL REPAIR OF PERFORATION AND DRAINAGE OF INTERABDOMINAL CYST (Abdomen)  Patient Location: PACU  Anesthesia Type:General  Level of Consciousness: awake, alert  and oriented  Airway & Oxygen Therapy: Patient Spontanous Breathing  Post-op Assessment: Report given to RN and Post -op Vital signs reviewed and stable  Post vital signs: Reviewed and stable  Last Vitals:  Vitals Value Taken Time  BP 129/81 07/19/21 1419  Temp    Pulse 110 07/19/21 1423  Resp 11 07/19/21 1423  SpO2 93 % 07/19/21 1423  Vitals shown include unvalidated device data.  Last Pain:  Vitals:   07/19/21 1155  TempSrc: Oral  PainSc: 10-Worst pain ever      Patients Stated Pain Goal: 0 (07/15/21 2050)  Complications: No notable events documented.

## 2021-07-19 NOTE — Progress Notes (Signed)
Referring Physician(s): * No referring provider recorded for this case *  Supervising Physician: Mir, Mauri Reading  Patient Status:  Thomas [name] - In-pt  Chief Complaint:  Small bowel obstruction and pelvic abscess  Subjective:  Resting in bed.  Desiring to eat/drink.  Pain controlled. Asking questions about expected hospital course.  Allergies: Patient has no known allergies.  Medications: Prior to Admission medications   Medication Sig Start Date End Date Taking? Authorizing Provider  benztropine (COGENTIN) 1 MG tablet Take 1 tablet (1 mg total) by mouth 2 (two) times daily. Patient not taking: Reported on 07/11/2021 02/05/19   Malvin Johns, MD  divalproex (DEPAKOTE ER) 500 MG 24 hr tablet Take 1 tablet (500 mg total) by mouth 2 (two) times daily. Patient not taking: Reported on 07/11/2021 02/05/19   Malvin Johns, MD  fluPHENAZine (PROLIXIN) 5 MG tablet Take 1 tablet (5 mg total) by mouth 3 (three) times daily. Patient not taking: Reported on 07/11/2021 02/05/19   Malvin Johns, MD  gabapentin (NEURONTIN) 300 MG capsule Take 1 capsule (300 mg total) by mouth 3 (three) times daily. Patient not taking: Reported on 07/11/2021 02/05/19   Malvin Johns, MD  lidocaine (LIDODERM) 5 % Place 1 patch onto the skin daily. Remove & Discard patch within 12 hours or as directed by MD Patient not taking: Reported on 07/11/2021 05/04/19   Caccavale, Sophia, PA-C  naproxen (NAPROSYN) 500 MG tablet Take 1 tablet (500 mg total) by mouth 2 (two) times daily with a meal. Patient not taking: Reported on 07/11/2021 05/04/19   Caccavale, Sophia, PA-C     Vital Signs: BP 115/74 (BP Location: Left Arm)   Pulse 94   Temp 99.5 F (37.5 C) (Oral)   Resp 16   Ht 5\' 9"  (1.753 m)   Wt 130 lb (59 kg)   SpO2 99%   BMI 19.20 kg/m   Drain Location: suprapubic Size: Fr size: 12 Fr Date of placement: 07/18/21  Currently to: Drain collection device: gravity 24 hour output:  Output by Drain (mL) 07/17/21 0700 - 07/17/21  1459 07/17/21 1500 - 07/17/21 2259 07/17/21 2300 - 07/18/21 0659 07/18/21 0700 - 07/18/21 1459 07/18/21 1500 - 07/18/21 2259 07/18/21 2300 - 07/19/21 0659 07/19/21 0700 - 07/19/21 0922  Closed System Drain Left Abdomen Autotranfusion system (Autovac) 12 Fr.    350  550     Current examination: Flushes/aspirates easily.  Insertion site unremarkable. Suture and stat lock in place. Dressed appropriately.     Imaging: CT ABDOMEN PELVIS W CONTRAST  Result Date: 07/17/2021 CLINICAL DATA:  Laparotomy 07/12/2021 for small-bowel obstruction and lysis of adhesions. Abdominal pain. EXAM: CT ABDOMEN AND PELVIS WITH CONTRAST TECHNIQUE: Multidetector CT imaging of the abdomen and pelvis was performed using the standard protocol following bolus administration of intravenous contrast. CONTRAST:  53mL OMNIPAQUE IOHEXOL 350 MG/ML SOLN COMPARISON:  CT 07/10/2021 (preop) FINDINGS: Lower chest: Atelectasis in the RIGHT lower lobe.  No pleural fluid. Hepatobiliary: Saw intraperitoneal free air anterior to the RIGHT hepatic lobe beneath the RIGHT hemidiaphragm related to recent open surgery. No focal hepatic lesion. Gallbladder normal. Pancreas: The head and body of the pancreas appear normal. The tail is not well defined (image 31/3. Spleen: Normal spleen Adrenals/urinary tract: Adrenal glands normal. The kidneys enhance symmetrically. Ureters bladder normal Stomach/Bowel: NG tube in the stomach. Large volume of oral contrast remains in the stomach. Versus second portion the duodenum are dilated (image 31/3. There is decreased distention of the mid small bowel compared to prior.  The appendix is not identified. Terminal ileum appears normal. There is fluid stool in the ascending and transverse colon. Descending colon is collapsed. There is a large fluid collection in the pelvis anterior to the rectum and extending superior to the bladder and anteriorly to the ventral peritoneal space. This fluid collection is dumbbell-shaped  and has a thin enhancing rim. There is also gas within the collection. The dumbbell shaped collection measures 12.5 x 7.3 cm (image 79/3.) An "ear" of the abscess extends toward the RIGHT iliac vessels (image 77/3). Vascular/Lymphatic: Abdominal aorta is normal caliber. No periportal or retroperitoneal adenopathy. No pelvic adenopathy. Reproductive: Prostate unremarkable. Other: Large pelvic and lower abdominal abscess described in the GI section. Musculoskeletal: No aggressive osseous lesion. IMPRESSION: 1. Large dumbbell-shaped pelvic abscess. 2. Improvement in bowel obstruction pattern seen on comparison exam; however, there stasis of enteric contents and bowel dilatation suggesting ileus. Fluid stool throughout the colon. Mildly dilated small bowel. Multiple air-fluid levels. 3. NG tube in stomach. 4. Tail of the pancreas is poorly evaluated however pancreatitis is not favored. These results will be called to the ordering clinician or representative by the Radiologist Assistant, and communication documented in the PACS or [employer]. Electronically Signed   By: Genevive Bi M.D.   On: 07/17/2021 20:44   DG CHEST PORT 1 VIEW  Result Date: 07/17/2021 CLINICAL DATA:  Fever EXAM: PORTABLE CHEST 1 VIEW COMPARISON:  09/05/2019 FINDINGS: Nasogastric tube enters the stomach. Right arm PICC tip in the proximal right atrium. The left lung is clear. There is volume loss at the right lung base. IMPRESSION: Right lower lobe volume loss. Electronically Signed   By: Paulina Fusi M.D.   On: 07/17/2021 11:58   DG Abd Portable 1V  Result Date: 07/16/2021 CLINICAL DATA:  Check gastric catheter placement EXAM: PORTABLE ABDOMEN - 1 VIEW COMPARISON:  07/12/2021 FINDINGS: Gastric catheter is noted within the stomach. Scattered large and small bowel gas is noted. Small-bowel dilatation is seen consistent with the known small bowel obstruction. No free air is noted. IMPRESSION: Gastric catheter within the stomach.  Persistent small bowel dilatation is noted. Electronically Signed   By: Alcide Clever M.D.   On: 07/16/2021 22:35   CT IMAGE GUIDED DRAINAGE BY PERCUTANEOUS CATHETER  Result Date: 07/18/2021 Oley Balm, MD     07/18/2021  1:12 PM Procedure: CT LLQ pelvic abscess drain catheter placement 82f EBL:   minimal Complications:  none immediate See full dictation in [employer]. Thora Lance MD Main # [phone] Pager  [phone]    Labs:  CBC: Recent Labs    07/15/21 0235 07/17/21 1119 07/18/21 0823 07/19/21 0901  WBC 6.7 22.1* 25.2* 17.0*  HGB 11.7* 12.1* 12.1* 10.9*  HCT 35.8* 37.4* 37.0* 33.7*  PLT 253 341 359 419*    COAGS: Recent Labs    07/18/21 0823  INR 1.1    BMP: Recent Labs    07/14/21 0148 07/15/21 0235 07/16/21 0314 07/17/21 0333  NA 131* 135 136 136  K 5.3* 3.6 3.4* 3.4*  CL 102 100 102 98  CO2 21* 28 27 28   GLUCOSE 102* 107* 129* 144*  BUN 23* 19 13 14   CALCIUM 7.5* 8.0* 7.9* 8.0*  CREATININE 1.04 0.97 0.68 0.75  GFRNONAA >60 >60 >60 >60    LIVER FUNCTION TESTS: Recent Labs    07/10/21 1008 07/13/21 0346 07/15/21 0235 07/17/21 0333  BILITOT 1.1 0.5 0.6 0.3  AST 28 22 20 15   ALT  17 13 10 13   ALKPHOS 83 57 58 49  PROT 8.7* 5.8* 5.5* 6.0*  ALBUMIN 4.1 2.4* 2.0* 2.0*    Assessment and Plan:  Plan: Continue TID flushes with 5 cc NS. Record output Q shift. Dressing changes QD or PRN if soiled.  Call IR APP or on call IR MD if difficulty flushing or sudden change in drain output.  Repeat imaging/possible drain injection once output < 10 mL/QD (excluding flush material.)  Discharge planning: Please contact IR APP or on call IR MD prior to patient d/c to ensure appropriate follow up plans are in place. Typically patient will follow up with IR clinic 10-14 days post d/c for repeat imaging/possible drain injection. IR scheduler will contact patient with date/time of appointment. Patient will need to flush drain QD with 5 cc NS, record  output QD, dressing changes every 2-3 days or earlier if soiled.   IR will continue to follow - please call with questions or concerns.  Electronically Signed: , PA 07/19/2021, 9:21 AM   I spent a total of 15 Minutes at the the patient's bedside AND on the patient's hospital floor or unit, greater than 50% of which was counseling/coordinating care for pelvic abscess.

## 2021-07-19 NOTE — Interval H&P Note (Signed)
History and Physical Interval Note:  07/19/2021 11:35 AM  Thomas [name]  has presented today for surgery, with the diagnosis of post-operative bowel leak.  The various methods of treatment have been discussed with the patient and family. After consideration of risks, benefits and other options for treatment, the patient has consented to  Procedure(s): EXPLORATORY LAPAROTOMY POSSIBLE BOWEL RESECTION (N/A) as a surgical intervention.  The patient's history has been reviewed, patient examined, no change in status, stable for surgery.  I have reviewed the patient's chart and labs.  Questions were answered to the patient's satisfaction.     [name]

## 2021-07-19 NOTE — Progress Notes (Signed)
Pt back on unit post-op.  A/o x4.  Midline incision with ABD pad and tape. JP drain--drainage serosanguinous.

## 2021-07-19 NOTE — Anesthesia Procedure Notes (Signed)
Procedure Name: Intubation Date/Time: 07/19/2021 12:45 PM Performed by: Trinna Post., CRNA Pre-anesthesia Checklist: Patient identified, Emergency Drugs available, Suction available, Patient being monitored and Timeout performed Patient Re-evaluated:Patient Re-evaluated prior to induction Oxygen Delivery Method: Circle system utilized Preoxygenation: Pre-oxygenation with 100% oxygen Induction Type: IV induction Ventilation: Mask ventilation without difficulty Laryngoscope Size: Mac and 4 Grade View: Grade I Tube type: Oral Tube size: 7.5 mm Number of attempts: 1 Airway Equipment and Method: Stylet Placement Confirmation: ETT inserted through vocal cords under direct vision, positive ETCO2 and breath sounds checked- equal and bilateral Secured at: 22 cm Tube secured with: Tape Dental Injury: Teeth and Oropharynx as per pre-operative assessment

## 2021-07-19 NOTE — Progress Notes (Addendum)
PHARMACY - TOTAL PARENTERAL NUTRITION CONSULT NOTE   Indication: Prolonged ileus  Patient Measurements: Height: 5\' 9"  (175.3 cm) Weight: 59 kg (130 lb) IBW/kg (Calculated) : 70.7 TPN AdjBW (KG): 59 Body mass index is 19.2 kg/m. Usual Weight: 140 lbs  Assessment: 50 yo male presented on 07/10/2021 with abdominal pain, nausea, and vomiting for 3 days prior. PMH significant for polysubstance abuse, homelessness, bipolar disorder, SBO resulting in exlap, LOA and appendectomy at [facility] in May 2020. Patient reported one SBO since that was treated with NGT and improved. Pharmacy consulted to start TPN for SBO, ileus.  Glucose / Insulin: no hx DM. A1C 5.3 in April 2020. CBGs 98-137. 4 units SSI given/24h.  Electrolytes: Na 138, K 3.4 remains low, Phos 4.1 replaced,  Mg 2.3.  Renal: Scr 0.72, UOP 0.7.  BUN wnl Hepatic: LFTs wnl, t bili wnl. Albumin 2.0. TG 63 Intake / Output; MIVF: NS @100  ml/hr. UOP May 2020. NG 1850 out. LBM 10/3.  GI Imaging: - 9/26 xray abd: concerning for SBO - 9/26 CT abd: high grade partial SBO, suspected internal hernia vs. Complex adhesions in RLQ - 9/27 xray abd: continued SBO - 9/28 xray abd: multiple gas filled dilated loops similar to CT -10/4 CT abdomen/pelvis: large dumbbell-shaped pelvic abscess  GI Surgeries / Procedures: - 9/28 exlap, LOA, small intestine repair x2 - 10/4: LLQ drain catheter placement.  Central access: PICC placed 9/30 TPN start date: 9/30  Nutritional Goals: Goal TPN rate is 85 mL/hr (provides 100 g of protein and 2052 kcals per day)  RD Assessment:  Estimated Needs [employer] Energy Estimated Needs: 2000-2200 Total Protein Estimated Needs: 90-110 grams Total Fluid Estimated Needs: >/= 2.0 L  Current Nutrition:  NPO  TPN to start 9/30  Plan: **fever has resolved, but 10/4 CT abdomen/pelvis showed large dumbbell-shaped pelvic abscess>>patient potentially going for drain today**  Continue TPN  to goal rate of 48mL/hr at 1800  which will provide 100g AA, 61.2g lipids (29.8%), 306g dextrose (GIR 3.6), and 2052 kcal. Electrolytes in TPN: Na 45mEq/L, incr K 55mEq/L (~80>100 mEq/day), Ca 18mEq/L, decr Mg 44mEq/L, and decr Phos 66mmol/L. Cl:Ac 1:1 Add standard MVI and trace elements to TPN Add thiamine 100mg  x 7 days for refeeding risk per RD (ends 10/6) Continue Sensitive q4h SSI and adjust as needed  MIVF 100 mL/hr based on high NG output ~3954ml/24hr per primary team Monitor TPN labs on Mon/Thurs and prn IV Kcl runs x 5 this AM.  Azayla Polo S. 9m, PharmD, BCPS Clinical Staff Pharmacist [email or web address]

## 2021-07-19 NOTE — Progress Notes (Signed)
7 Days Post-Op   Chief Complaint/Subjective: Cc  Asking for ice chips. Asking for me to update his niece. Pain about the same. No new complaints.  Afebrile, vitals improved.    Objective: Vital signs in last 24 hours: Temp:  [97.6 F (36.4 C)-99.5 F (37.5 C)] 99.5 F (37.5 C) (10/05 0753) Pulse Rate:  [80-110] 94 (10/05 0753) Resp:  [13-22] 16 (10/05 0753) BP: (103-125)/(71-86) 115/74 (10/05 0753) SpO2:  [96 %-99 %] 99 % (10/05 0753) Last BM Date: 07/17/21 Intake/Output from previous day: 10/04 0701 - 10/05 0700 In: 4218.8 [I.V.:3719; NG/GT:75; IV Piggyback:424.8] Out: 3800 [Urine:1050; Emesis/NG output:1850; Drains:900] Intake/Output this shift: No intake/output data recorded.  PE: Gen: NAD Resp: nonlabored, CTAB anterior chest wall Card: RRR Abd: soft, appropriately tender, distended, midline incision with staples c/d/I, hypoactive BS  NG - 1900 cc/24h, dark and bilious, ?blood tinged  IR suprapubic drain - 900 cc in <24h, foul smelling, looks like enteric contents  Lab Results:  Recent Labs    07/18/21 0823 07/19/21 0901  WBC 25.2* 17.0*  HGB 12.1* 10.9*  HCT 37.0* 33.7*  PLT 359 419*   BMET Recent Labs    07/17/21 0333 07/19/21 0901  NA 136 138  K 3.4* 3.4*  CL 98 101  CO2 28 30  GLUCOSE 144* 131*  BUN 14 20  CREATININE 0.75 0.72  CALCIUM 8.0* 8.0*   PT/INR Recent Labs    07/18/21 0823  LABPROT 14.1  INR 1.1   CMP     Component Value Date/Time   NA 138 07/19/2021 0901   K 3.4 (L) 07/19/2021 0901   CL 101 07/19/2021 0901   CO2 30 07/19/2021 0901   GLUCOSE 131 (H) 07/19/2021 0901   BUN 20 07/19/2021 0901   CREATININE 0.72 07/19/2021 0901   CALCIUM 8.0 (L) 07/19/2021 0901   PROT 6.0 (L) 07/17/2021 0333   ALBUMIN 2.0 (L) 07/17/2021 0333   AST 15 07/17/2021 0333   ALT 13 07/17/2021 0333   ALKPHOS 49 07/17/2021 0333   BILITOT 0.3 07/17/2021 0333   GFRNONAA >60 07/19/2021 0901   GFRAA >60 09/05/2019 2005   Lipase     Component  Value Date/Time   LIPASE 40 07/10/2021 1008    Studies/Results: CT ABDOMEN PELVIS W CONTRAST  Result Date: 07/17/2021 CLINICAL DATA:  Laparotomy 07/12/2021 for small-bowel obstruction and lysis of adhesions. Abdominal pain. EXAM: CT ABDOMEN AND PELVIS WITH CONTRAST TECHNIQUE: Multidetector CT imaging of the abdomen and pelvis was performed using the standard protocol following bolus administration of intravenous contrast. CONTRAST:  68mL OMNIPAQUE IOHEXOL 350 MG/ML SOLN COMPARISON:  CT 07/10/2021 (preop) FINDINGS: Lower chest: Atelectasis in the RIGHT lower lobe.  No pleural fluid. Hepatobiliary: Saw intraperitoneal free air anterior to the RIGHT hepatic lobe beneath the RIGHT hemidiaphragm related to recent open surgery. No focal hepatic lesion. Gallbladder normal. Pancreas: The head and body of the pancreas appear normal. The tail is not well defined (image 31/3. Spleen: Normal spleen Adrenals/urinary tract: Adrenal glands normal. The kidneys enhance symmetrically. Ureters bladder normal Stomach/Bowel: NG tube in the stomach. Large volume of oral contrast remains in the stomach. Versus second portion the duodenum are dilated (image 31/3. There is decreased distention of the mid small bowel compared to prior. The appendix is not identified. Terminal ileum appears normal. There is fluid stool in the ascending and transverse colon. Descending colon is collapsed. There is a large fluid collection in the pelvis anterior to the rectum and extending superior to the bladder and  anteriorly to the ventral peritoneal space. This fluid collection is dumbbell-shaped and has a thin enhancing rim. There is also gas within the collection. The dumbbell shaped collection measures 12.5 x 7.3 cm (image 79/3.) An "ear" of the abscess extends toward the RIGHT iliac vessels (image 77/3). Vascular/Lymphatic: Abdominal aorta is normal caliber. No periportal or retroperitoneal adenopathy. No pelvic adenopathy. Reproductive: Prostate  unremarkable. Other: Large pelvic and lower abdominal abscess described in the GI section. Musculoskeletal: No aggressive osseous lesion. IMPRESSION: 1. Large dumbbell-shaped pelvic abscess. 2. Improvement in bowel obstruction pattern seen on comparison exam; however, there stasis of enteric contents and bowel dilatation suggesting ileus. Fluid stool throughout the colon. Mildly dilated small bowel. Multiple air-fluid levels. 3. NG tube in stomach. 4. Tail of the pancreas is poorly evaluated however pancreatitis is not favored. These results will be called to the ordering clinician or representative by the Radiologist Assistant, and communication documented in the PACS or [employer]. Electronically Signed   By: Genevive Bi M.D.   On: 07/17/2021 20:44   CT IMAGE GUIDED DRAINAGE BY PERCUTANEOUS CATHETER  Result Date: 07/18/2021 Oley Balm, MD     07/18/2021  1:12 PM Procedure: CT LLQ pelvic abscess drain catheter placement 59f EBL:   minimal Complications:  none immediate See full dictation in [employer]. Thora Lance MD Main # [phone] Pager  [phone]    Anti-infectives: Anti-infectives (From admission, onward)    Start     Dose/Rate Route Frequency Ordered Stop   07/18/21 1000  piperacillin-tazobactam (ZOSYN) IVPB 3.375 g        3.375 g 12.5 mL/hr over 240 Minutes Intravenous Every 8 hours 07/18/21 0839     07/12/21 1200  ceFAZolin (ANCEF) IVPB 2g/100 mL premix        2 g 200 mL/hr over 30 Minutes Intravenous To Short Stay 07/12/21 0843 07/12/21 1100       Assessment/Plan POD 7, S/P : EXPLORATORY LAPAROTOMY; LYSIS OF ADHESIONS; SMALL INTESTINE REPAIR X 2 07/12/21 Dr. Sheliah Hatch for SBO - afebrile, VSS WBC 22 >17  - picc and TNA started 9/30 - IR placed a drain in a pelvic fluid collection 10/4 -- drainage looks like enteric contents. Suspect small bowel leak. Recommend exploratory laparotomy, possible bowel resection to identify and fix the leak.  - OOB, mobilize   - incentive spirometry q 1h  Fever- last  fever was 10/3. CXR and UA negative 10/3. CT A/P 10/3 with pelvic fluid collection.   FEN - NPO, NG to LIWS, PICC/TNA VTE - lovenox ID - ancef x1 dose 9/28 perioperatively, Zosyn 10/4 >> Foley -  DC 9/30 Disposition - to OR for ex lap, add IV PPI  AKI - resolved  Bipolar disorder Homelessness Polysubstance abuse  Low UOP - resolved   LOS: 9 days   Francine Graven [facility] Surgery 07/19/2021, 9:49 AM Please see [employer] for pager number during day hours 7:00am-4:30pm or 7:00am -11:30am on weekends

## 2021-07-19 NOTE — Op Note (Signed)
07/19/2021  2:08 PM  PATIENT:  Thomas [name]  50 y.o. male  PRE-OPERATIVE DIAGNOSIS:  post-operative bowel leak  POST-OPERATIVE DIAGNOSIS:  post-operative bowel leak  PROCEDURE:  Procedure(s): EXPLORATORY LAPAROTOMY  REPAIR SMALL BOWEL PERFORATION  SURGEON:  Surgeon(s) and Role:    * Griselda Miner, MD - Primary  PHYSICIAN ASSISTANT:   ASSISTANTS: Bailey Mech, PA   ANESTHESIA:   general  EBL:  50 mL   BLOOD ADMINISTERED:none  DRAINS: (1) Blake drain(s) in the pelvis    LOCAL MEDICATIONS USED:  NONE  SPECIMEN:  No Specimen  DISPOSITION OF SPECIMEN:  N/A  COUNTS:  YES  TOURNIQUET:  * No tourniquets in log *  DICTATION: .Dragon Dictation  After informed consent was obtained the patient was brought to the operating room and placed in the supine position on the operating table.  After adequate induction of general anesthesia the patient's abdomen was prepped with Betadine and draped in usual sterile manner.  An appropriate timeout was performed.  The previously placed staples and the fascial stitches were removed and the fascia was gently separated by blunt finger dissection.  Upon entering the abdominal cavity it was observed that the small bowel was extremely densely stuck to the abdominal wall and to itself and the appearance of a bowel brain.  I was able to gently free up some loops of small bowel from the pelvis by blunt finger dissection but the slightest touch to the small intestine would cause some element of serosal tear.  I was able to identify the abscess cavity and the perforation of a distal loop of small bowel down in the pelvis.  I was not able to safely free the loop that had the leak from nearby loops.  In order to try to minimize any damage to the small bowel we elected to repair the perforation with interrupted 2-0 silk stitches.  The abdomen was then irrigated with copious amounts of saline.  The percutaneous drain was replaced with a 19 [place] round Blake  drain in the pelvis.  I was unable to explore any more of the abdominal cavity for fear of causing more injury to the small bowel.  At this point the fascia of the anterior abdominal wall was closed with 2 running #1 double-stranded looped PDS sutures.  The subcutaneous tissue was packed with 4 x 4 gauze.  Sterile dressings were applied.  The drain was anchored to the skin with a 3-0 nylon stitch and placed to bulb suction.  The patient tolerated the procedure well.  At the end of the case all needle sponge and instrument counts were correct.  The patient is then awakened and taken to recovery in stable condition.  PLAN OF CARE: Admit to inpatient   PATIENT DISPOSITION:  PACU - hemodynamically stable.   Delay start of Pharmacological VTE agent (>24hrs) due to surgical blood loss or risk of bleeding: no

## 2021-07-19 NOTE — Anesthesia Preprocedure Evaluation (Addendum)
Anesthesia Evaluation  Patient identified by MRN, date of birth, ID band Patient awake    Reviewed: Allergy & Precautions, NPO status , Patient's Chart, lab work & pertinent test results  History of Anesthesia Complications Negative for: history of anesthetic complications  Airway Mallampati: III  TM Distance: >3 FB Neck ROM: Full    Dental  (+) Dental Advisory Given   Pulmonary Current Smoker and Patient abstained from smoking.,    Pulmonary exam normal        Cardiovascular negative cardio ROS   Rhythm:Regular Rate:Tachycardia     Neuro/Psych PSYCHIATRIC DISORDERS Depression Bipolar Disorder  Hx suicide attempt Hx homicidal ideation negative neurological ROS     GI/Hepatic (+)     substance abuse  alcohol use and cocaine use,  SBO s/p bowel resection with postop bowel leak    Endo/Other  negative endocrine ROS  Renal/GU negative Renal ROS     Musculoskeletal negative musculoskeletal ROS (+)   Abdominal   Peds  Hematology  (+) anemia ,   Anesthesia Other Findings   Reproductive/Obstetrics                            Anesthesia Physical Anesthesia Plan  ASA: 3  Anesthesia Plan: General   Post-op Pain Management:    Induction: Intravenous and Rapid sequence  PONV Risk Score and Plan: 3 and Treatment may vary due to age or medical condition, Ondansetron, Midazolam and Dexamethasone  Airway Management Planned: Oral ETT  Additional Equipment: None  Intra-op Plan:   Post-operative Plan: Extubation in OR  Informed Consent: I have reviewed the patients History and Physical, chart, labs and discussed the procedure including the risks, benefits and alternatives for the proposed anesthesia with the patient or authorized representative who has indicated his/her understanding and acceptance.     Dental advisory given  Plan Discussed with: CRNA and  Anesthesiologist  Anesthesia Plan Comments:        Anesthesia Quick Evaluation

## 2021-07-19 NOTE — Progress Notes (Signed)
Pt refusing CHG bath prior to surgery. Pt stated to RN that he didn;'t know anything about the surgery. When OR transport came up to get pt, he said he was ready to go to surgery.  Pt needs to talk with MD and sign consent down in OR.

## 2021-07-19 NOTE — H&P (View-Only) (Signed)
7 Days Post-Op   Chief Complaint/Subjective: Cc  Asking for ice chips. Asking for me to update his niece. Pain about the same. No new complaints.  Afebrile, vitals improved.    Objective: Vital signs in last 24 hours: Temp:  [97.6 F (36.4 C)-99.5 F (37.5 C)] 99.5 F (37.5 C) (10/05 0753) Pulse Rate:  [80-110] 94 (10/05 0753) Resp:  [13-22] 16 (10/05 0753) BP: (103-125)/(71-86) 115/74 (10/05 0753) SpO2:  [96 %-99 %] 99 % (10/05 0753) Last BM Date: 07/17/21 Intake/Output from previous day: 10/04 0701 - 10/05 0700 In: 4218.8 [I.V.:3719; NG/GT:75; IV Piggyback:424.8] Out: 3800 [Urine:1050; Emesis/NG output:1850; Drains:900] Intake/Output this shift: No intake/output data recorded.  PE: Gen: NAD Resp: nonlabored, CTAB anterior chest wall Card: RRR Abd: soft, appropriately tender, distended, midline incision with staples c/d/I, hypoactive BS  NG - 1900 cc/24h, dark and bilious, ?blood tinged  IR suprapubic drain - 900 cc in <24h, foul smelling, looks like enteric contents  Lab Results:  Recent Labs    07/18/21 0823 07/19/21 0901  WBC 25.2* 17.0*  HGB 12.1* 10.9*  HCT 37.0* 33.7*  PLT 359 419*   BMET Recent Labs    07/17/21 0333 07/19/21 0901  NA 136 138  K 3.4* 3.4*  CL 98 101  CO2 28 30  GLUCOSE 144* 131*  BUN 14 20  CREATININE 0.75 0.72  CALCIUM 8.0* 8.0*   PT/INR Recent Labs    07/18/21 0823  LABPROT 14.1  INR 1.1   CMP     Component Value Date/Time   NA 138 07/19/2021 0901   K 3.4 (L) 07/19/2021 0901   CL 101 07/19/2021 0901   CO2 30 07/19/2021 0901   GLUCOSE 131 (H) 07/19/2021 0901   BUN 20 07/19/2021 0901   CREATININE 0.72 07/19/2021 0901   CALCIUM 8.0 (L) 07/19/2021 0901   PROT 6.0 (L) 07/17/2021 0333   ALBUMIN 2.0 (L) 07/17/2021 0333   AST 15 07/17/2021 0333   ALT 13 07/17/2021 0333   ALKPHOS 49 07/17/2021 0333   BILITOT 0.3 07/17/2021 0333   GFRNONAA >60 07/19/2021 0901   GFRAA >60 09/05/2019 2005   Lipase     Component  Value Date/Time   LIPASE 40 07/10/2021 1008    Studies/Results: CT ABDOMEN PELVIS W CONTRAST  Result Date: 07/17/2021 CLINICAL DATA:  Laparotomy 07/12/2021 for small-bowel obstruction and lysis of adhesions. Abdominal pain. EXAM: CT ABDOMEN AND PELVIS WITH CONTRAST TECHNIQUE: Multidetector CT imaging of the abdomen and pelvis was performed using the standard protocol following bolus administration of intravenous contrast. CONTRAST:  68mL OMNIPAQUE IOHEXOL 350 MG/ML SOLN COMPARISON:  CT 07/10/2021 (preop) FINDINGS: Lower chest: Atelectasis in the RIGHT lower lobe.  No pleural fluid. Hepatobiliary: Saw intraperitoneal free air anterior to the RIGHT hepatic lobe beneath the RIGHT hemidiaphragm related to recent open surgery. No focal hepatic lesion. Gallbladder normal. Pancreas: The head and body of the pancreas appear normal. The tail is not well defined (image 31/3. Spleen: Normal spleen Adrenals/urinary tract: Adrenal glands normal. The kidneys enhance symmetrically. Ureters bladder normal Stomach/Bowel: NG tube in the stomach. Large volume of oral contrast remains in the stomach. Versus second portion the duodenum are dilated (image 31/3. There is decreased distention of the mid small bowel compared to prior. The appendix is not identified. Terminal ileum appears normal. There is fluid stool in the ascending and transverse colon. Descending colon is collapsed. There is a large fluid collection in the pelvis anterior to the rectum and extending superior to the bladder and  anteriorly to the ventral peritoneal space. This fluid collection is dumbbell-shaped and has a thin enhancing rim. There is also gas within the collection. The dumbbell shaped collection measures 12.5 x 7.3 cm (image 79/3.) An "ear" of the abscess extends toward the RIGHT iliac vessels (image 77/3). Vascular/Lymphatic: Abdominal aorta is normal caliber. No periportal or retroperitoneal adenopathy. No pelvic adenopathy. Reproductive: Prostate  unremarkable. Other: Large pelvic and lower abdominal abscess described in the GI section. Musculoskeletal: No aggressive osseous lesion. IMPRESSION: 1. Large dumbbell-shaped pelvic abscess. 2. Improvement in bowel obstruction pattern seen on comparison exam; however, there stasis of enteric contents and bowel dilatation suggesting ileus. Fluid stool throughout the colon. Mildly dilated small bowel. Multiple air-fluid levels. 3. NG tube in stomach. 4. Tail of the pancreas is poorly evaluated however pancreatitis is not favored. These results will be called to the ordering clinician or representative by the Radiologist Assistant, and communication documented in the PACS or [employer]. Electronically Signed   By: Genevive Bi M.D.   On: 07/17/2021 20:44   CT IMAGE GUIDED DRAINAGE BY PERCUTANEOUS CATHETER  Result Date: 07/18/2021 Oley Balm, MD     07/18/2021  1:12 PM Procedure: CT LLQ pelvic abscess drain catheter placement 80f EBL:   minimal Complications:  none immediate See full dictation in [employer]. Thora Lance MD Main # [phone] Pager  [phone]    Anti-infectives: Anti-infectives (From admission, onward)    Start     Dose/Rate Route Frequency Ordered Stop   07/18/21 1000  piperacillin-tazobactam (ZOSYN) IVPB 3.375 g        3.375 g 12.5 mL/hr over 240 Minutes Intravenous Every 8 hours 07/18/21 0839     07/12/21 1200  ceFAZolin (ANCEF) IVPB 2g/100 mL premix        2 g 200 mL/hr over 30 Minutes Intravenous To Short Stay 07/12/21 0843 07/12/21 1100       Assessment/Plan POD 7, S/P : EXPLORATORY LAPAROTOMY; LYSIS OF ADHESIONS; SMALL INTESTINE REPAIR X 2 07/12/21 Dr. Sheliah Hatch for SBO - afebrile, VSS WBC 22 >17  - picc and TNA started 9/30 - IR placed a drain in a pelvic fluid collection 10/4 -- drainage looks like enteric contents. Suspect small bowel leak. Recommend exploratory laparotomy, possible bowel resection to identify and fix the leak.  - OOB, mobilize   - incentive spirometry q 1h  Fever- last  fever was 10/3. CXR and UA negative 10/3. CT A/P 10/3 with pelvic fluid collection.   FEN - NPO, NG to LIWS, PICC/TNA VTE - lovenox ID - ancef x1 dose 9/28 perioperatively, Zosyn 10/4 >> Foley -  DC 9/30 Disposition - to OR for ex lap, add IV PPI  AKI - resolved  Bipolar disorder Homelessness Polysubstance abuse  Low UOP - resolved   LOS: 9 days   Francine Graven [facility] Surgery 07/19/2021, 9:49 AM Please see Amion for pager number during day hours 7:00am-4:30pm or 7:00am -11:30am on weekends

## 2021-07-20 ENCOUNTER — Encounter (HOSPITAL_COMMUNITY): Payer: Self-pay | Admitting: General Surgery

## 2021-07-20 LAB — CBC
HCT: 35.7 % — ABNORMAL LOW (ref 39.0–52.0)
Hemoglobin: 11.7 g/dL — ABNORMAL LOW (ref 13.0–17.0)
MCH: 29.8 pg (ref 26.0–34.0)
MCHC: 32.8 g/dL (ref 30.0–36.0)
MCV: 91.1 fL (ref 80.0–100.0)
Platelets: 525 10*3/uL — ABNORMAL HIGH (ref 150–400)
RBC: 3.92 MIL/uL — ABNORMAL LOW (ref 4.22–5.81)
RDW: 13.5 % (ref 11.5–15.5)
WBC: 29.7 10*3/uL — ABNORMAL HIGH (ref 4.0–10.5)
nRBC: 0 % (ref 0.0–0.2)

## 2021-07-20 LAB — COMPREHENSIVE METABOLIC PANEL
ALT: 13 U/L (ref 0–44)
AST: 18 U/L (ref 15–41)
Albumin: 1.6 g/dL — ABNORMAL LOW (ref 3.5–5.0)
Alkaline Phosphatase: 67 U/L (ref 38–126)
Anion gap: 8 (ref 5–15)
BUN: 17 mg/dL (ref 6–20)
CO2: 26 mmol/L (ref 22–32)
Calcium: 8.1 mg/dL — ABNORMAL LOW (ref 8.9–10.3)
Chloride: 104 mmol/L (ref 98–111)
Creatinine, Ser: 0.7 mg/dL (ref 0.61–1.24)
GFR, Estimated: >60 mL/min (ref >60)
Glucose, Bld: 144 mg/dL — ABNORMAL HIGH (ref 70–99)
Potassium: 4.2 mmol/L (ref 3.5–5.1)
Sodium: 138 mmol/L (ref 135–145)
Total Bilirubin: 0.4 mg/dL (ref 0.3–1.2)
Total Protein: 6 g/dL — ABNORMAL LOW (ref 6.5–8.1)

## 2021-07-20 LAB — MAGNESIUM: Magnesium: 1.9 mg/dL (ref 1.7–2.4)

## 2021-07-20 LAB — GLUCOSE, CAPILLARY
Glucose-Capillary: 128 mg/dL — ABNORMAL HIGH (ref 70–99)
Glucose-Capillary: 134 mg/dL — ABNORMAL HIGH (ref 70–99)
Glucose-Capillary: 133 mg/dL — ABNORMAL HIGH (ref 70–99)
Glucose-Capillary: 145 mg/dL — ABNORMAL HIGH (ref 70–99)
Glucose-Capillary: 127 mg/dL — ABNORMAL HIGH (ref 70–99)
Glucose-Capillary: 96 mg/dL (ref 70–99)

## 2021-07-20 LAB — PHOSPHORUS: Phosphorus: 3.6 mg/dL (ref 2.5–4.6)

## 2021-07-20 MED ORDER — WHITE PETROLATUM EX OINT
TOPICAL_OINTMENT | CUTANEOUS | Status: AC
  Filled 2021-07-20: qty 28.35

## 2021-07-20 MED ORDER — HYDROMORPHONE HCL 1 MG/ML IJ SOLN
0.5000 mg | INTRAMUSCULAR | Status: DC | PRN
  Administered 2021-07-20 – 2021-07-24 (×36): 1 mg via INTRAVENOUS
  Filled 2021-07-20 (×36): qty 1

## 2021-07-20 MED ORDER — POTASSIUM ACETATE 2 MEQ/ML IV SOLN
INTRAVENOUS | Status: AC
Start: 2021-07-20 — End: 2021-07-21
  Filled 2021-07-20: qty 999.6

## 2021-07-20 NOTE — Progress Notes (Signed)
1 Day Post-Op   Chief Complaint/Subjective: Cc  C/o uncontrolled abdominal pain. States he had one episode flatus last night. Denies BM.   Afebrile, HR 105 Objective: Vital signs in last 24 hours: Temp:  [98.4 F (36.9 C)-101.1 F (38.4 C)] 98.4 F (36.9 C) (10/06 0426) Pulse Rate:  [104-112] 105 (10/06 0426) Resp:  [11-20] 18 (10/06 0426) BP: (117-134)/(65-87) 117/77 (10/06 0426) SpO2:  [92 %-98 %] 96 % (10/06 0426) Last BM Date: 07/17/21 Intake/Output from previous day: 10/05 0701 - 10/06 0700 In: 3726.8 [I.V.:3138; IV Piggyback:588.8] Out: 2865 [Urine:1550; Emesis/NG output:400; Drains:865; Blood:50] Intake/Output this shift: No intake/output data recorded.  PE: Gen: NAD Resp: nonlabored, CTAB anterior chest wall Card: RRR Abd: soft, appropriately tender, distended, midline incision with staples c/d/I, hypoactive BS  NG - 400 cc/24h, dark and bilious  JP (placed 10/5) - 640 cc/24h, cloudy SS   Lab Results:  Recent Labs    07/19/21 0901 07/20/21 0406  WBC 17.0* 29.7*  HGB 10.9* 11.7*  HCT 33.7* 35.7*  PLT 419* 525*   BMET Recent Labs    07/19/21 0901 07/20/21 0406  NA 138 138  K 3.4* 4.2  CL 101 104  CO2 30 26  GLUCOSE 131* 144*  BUN 20 17  CREATININE 0.72 0.70  CALCIUM 8.0* 8.1*   PT/INR Recent Labs    07/18/21 0823  LABPROT 14.1  INR 1.1   CMP     Component Value Date/Time   NA 138 07/20/2021 0406   K 4.2 07/20/2021 0406   CL 104 07/20/2021 0406   CO2 26 07/20/2021 0406   GLUCOSE 144 (H) 07/20/2021 0406   BUN 17 07/20/2021 0406   CREATININE 0.70 07/20/2021 0406   CALCIUM 8.1 (L) 07/20/2021 0406   PROT 6.0 (L) 07/20/2021 0406   ALBUMIN 1.6 (L) 07/20/2021 0406   AST 18 07/20/2021 0406   ALT 13 07/20/2021 0406   ALKPHOS 67 07/20/2021 0406   BILITOT 0.4 07/20/2021 0406   GFRNONAA >60 07/20/2021 0406   GFRAA >60 09/05/2019 2005   Lipase     Component Value Date/Time   LIPASE 40 07/10/2021 1008    Studies/Results: CT IMAGE  GUIDED DRAINAGE BY PERCUTANEOUS CATHETER  Result Date: 07/18/2021 Oley Balm, MD     07/18/2021  1:12 PM Procedure: CT LLQ pelvic abscess drain catheter placement 19f EBL:   minimal Complications:  none immediate See full dictation in [employer]. Thora Lance MD Main # [phone] Pager  [phone]    Anti-infectives: Anti-infectives (From admission, onward)    Start     Dose/Rate Route Frequency Ordered Stop   07/18/21 1000  piperacillin-tazobactam (ZOSYN) IVPB 3.375 g        3.375 g 12.5 mL/hr over 240 Minutes Intravenous Every 8 hours 07/18/21 0839     07/12/21 1200  ceFAZolin (ANCEF) IVPB 2g/100 mL premix        2 g 200 mL/hr over 30 Minutes Intravenous To Short Stay 07/12/21 0843 07/12/21 1100       Assessment/Plan POD 8, S/P : EXPLORATORY LAPAROTOMY; LYSIS OF ADHESIONS; SMALL INTESTINE REPAIR X 2 07/12/21 Dr. Sheliah Hatch for SBO - post-op ileus, fever, leukocytosis, tachycardia >> CT A/P 10/3 showed large pelvic fluid collection - IR placed a drain 10/4 and drain efflux concerning for enteric contents POD 1, S/P LAPAROTOMY, REPAIR OF SMALL BOWEL PERFORATION, PLACEMENT 19F BLAKE DRAIN 10/5 Dr Carolynne Edouard - continue NG, PICC/TPN - continue IV abx - monitor drain output, at risk  for recurrent small bowel leak - OOB, mobilize  - incentive spirometry q 1h - PPI for suspected gastritis, scant blood in NG cannister 10/5  FEN - NPO, NG to LIWS, PICC/TNA 9/30 >> VTE - lovenox ID - ancef x1 dose 9/28 perioperatively, Zosyn 10/4 >> Foley -  DC 9/30 Disposition - med-surg, post-op ileus, IV abx  AKI - resolved  Bipolar disorder Homelessness Polysubstance abuse  Low UOP - resolved   LOS: 10 days   Francine Graven [facility] Surgery 07/20/2021, 8:00 AM Please see Amion for pager number during day hours 7:00am-4:30pm or 7:00am -11:30am on weekends

## 2021-07-20 NOTE — Progress Notes (Signed)
PHARMACY - TOTAL PARENTERAL NUTRITION CONSULT NOTE   Indication: Prolonged ileus  Patient Measurements: Height: 5\' 9"  (175.3 cm) Weight: 59 kg (130 lb) IBW/kg (Calculated) : 70.7 TPN AdjBW (KG): 59 Body mass index is 19.2 kg/m. Usual Weight: 140 lbs  Assessment: 50 yo male presented on 07/10/2021 with abdominal pain, nausea, and vomiting for 3 days prior. PMH significant for polysubstance abuse, homelessness, bipolar disorder, SBO resulting in exlap, LOA and appendectomy at [facility] in May 2020. Patient reported one SBO since that was treated with NGT and improved. Pharmacy consulted to start TPN for SBO, ileus.  Glucose / Insulin: no hx DM. A1C 5.3 in April 2020. CBGs 106-148. 4 units SSI given/24h.  Electrolytes: Na 138, K 4.2, Phos 3.6,  Mg 1.9  - (goal K>=4, Mg >=2 for ileus)  Renal: Scr 0.7, BUN wnl Hepatic: LFTs wnl, t bili wnl. Albumin 1.6. TG 63 Intake / Output; MIVF: NS @100  ml/hr. UOP 156ml=1.1. NG 400 out. LBM 10/3.  ID: WBC up to 29.7 on Zosyn 10/4>> GI Imaging: - 9/26 xray abd: concerning for SBO - 9/26 CT abd: high grade partial SBO, suspected internal hernia vs. Complex adhesions in RLQ - 9/27 xray abd: continued SBO - 9/28 xray abd: multiple gas filled dilated loops similar to CT -10/4 CT abdomen/pelvis: large dumbbell-shaped pelvic abscess  GI Surgeries / Procedures: - 9/28 exlap, LOA, small intestine repair x2 - 10/4: LLQ drain catheter placement. - 10/5: ex lap with SBR for post-op bowel leak  Central access: PICC placed 9/30 TPN start date: 9/30  Nutritional Goals: Goal TPN rate is 85 mL/hr (provides 100 g of protein and 2052 kcals per day)  RD Assessment:  Estimated Needs Total Energy Estimated Needs: 2000-2200 Total Protein Estimated Needs: 90-110 grams Total Fluid Estimated Needs: >/= 2.0 L  Current Nutrition:  NPO  TPN to start 9/30  Plan:  Continue TPN  to goal rate of 62mL/hr at 1800 which will provide 100g AA, 61.2g lipids (29.8%),  306g dextrose (GIR 3.6), and 2052 kcal. Electrolytes in TPN: Na 28mEq/L, incr K 5mEq/L (~80>100 mEq/day), Ca 76mEq/L, decr Mg 72mEq/L, and decr Phos 41mmol/L. Cl:Ac 1:1 Add standard MVI and trace elements to TPN Add thiamine 100mg  x 7 days for refeeding risk per RD (ends 10/6) Continue Sensitive q4h SSI and adjust as needed  MIVF 100 mL/hr based on high NG output ~3970ml/24hr per primary team Monitor TPN labs on Mon/Thurs and prn  Arraya Buck S. 9m, PharmD, BCPS Clinical Staff Pharmacist [email or web address]

## 2021-07-20 NOTE — Progress Notes (Signed)
Dressing change--midline. Moist to dry--1 moist 4x4, 6 dry gauze 4x4, ABD, tape dressing.  Changed out transparent dressing on JP drain.  Pt tolerated well.

## 2021-07-21 ENCOUNTER — Inpatient Hospital Stay (HOSPITAL_COMMUNITY): Payer: Self-pay

## 2021-07-21 LAB — CBC
HCT: 31.3 % — ABNORMAL LOW (ref 39.0–52.0)
Hemoglobin: 10.3 g/dL — ABNORMAL LOW (ref 13.0–17.0)
MCH: 30 pg (ref 26.0–34.0)
MCHC: 32.9 g/dL (ref 30.0–36.0)
MCV: 91.3 fL (ref 80.0–100.0)
Platelets: 569 10*3/uL — ABNORMAL HIGH (ref 150–400)
RBC: 3.43 MIL/uL — ABNORMAL LOW (ref 4.22–5.81)
RDW: 13.4 % (ref 11.5–15.5)
WBC: 31.5 10*3/uL — ABNORMAL HIGH (ref 4.0–10.5)
nRBC: 0 % (ref 0.0–0.2)

## 2021-07-21 LAB — AEROBIC/ANAEROBIC CULTURE W GRAM STAIN (SURGICAL/DEEP WOUND)

## 2021-07-21 LAB — GLUCOSE, CAPILLARY
Glucose-Capillary: 133 mg/dL — ABNORMAL HIGH (ref 70–99)
Glucose-Capillary: 118 mg/dL — ABNORMAL HIGH (ref 70–99)
Glucose-Capillary: 113 mg/dL — ABNORMAL HIGH (ref 70–99)
Glucose-Capillary: 93 mg/dL (ref 70–99)
Glucose-Capillary: 112 mg/dL — ABNORMAL HIGH (ref 70–99)
Glucose-Capillary: 117 mg/dL — ABNORMAL HIGH (ref 70–99)

## 2021-07-21 MED ORDER — TRAVASOL 10 % IV SOLN
INTRAVENOUS | Status: AC
  Filled 2021-07-21: qty 999.6

## 2021-07-21 NOTE — Progress Notes (Signed)
12 fr. Pigtail abscess drain placed in IR 07/18/21; patient taken to the OR 07/19/21 and drain was exchanged for a 19 fr. Blake drain to suction bulb.   IR received request to exchange JP bulb for gravity bag due to high volume output. IR does not have the supplies to connect the surgical drain to an IR gravity bag. Marijean Niemann, RN made aware.  Alwyn Ren, [place] [phone] 07/21/2021, 11:55 AM

## 2021-07-21 NOTE — Evaluation (Signed)
Physical Therapy Evaluation Patient Details Name: Thomas [name] MRN: [id number] DOB: 1971-09-08 Today's Date: 07/21/2021  History of Present Illness  Pt is a 50 y/o male admitted 9/26 secondary to nausea/vomiting, and abdominal pain. Found to have SBO and had NG tube placed. Pt is s/p lysis of adhesions and small intestine repaire on 9/28. Pt with LLQ abscess and had percutaneous drain placement on 10/4. Pt found to have post op bowel leak and is s/p repair of small bowel perforation on 10/5. PMH includes bipolar disorder, tobacco use and substance abuse.  Clinical Impression  Pt admitted secondary to problem above with deficits below. Pt very slow to move secondary to pain and required max encouragement for participation. Required mod A for bed mobility and min to mod A +2 for transfers using RW. Pt was previously independent with mobility tasks. Feel pt would benefit from SNF level therapies at d/c to address current deficits. Will continue to follow acutely.        Recommendations for follow up therapy are one component of a multi-disciplinary discharge planning process, led by the attending physician.  Recommendations may be updated based on patient status, additional functional criteria and insurance authorization.  Follow Up Recommendations SNF;Supervision for mobility/OOB    Equipment Recommendations  Other (comment) (TBD)    Recommendations for Other Services       Precautions / Restrictions Precautions Precautions: Other (comment);Fall Precaution Comments: JP drain, NG tube Restrictions Weight Bearing Restrictions: No      Mobility  Bed Mobility Overal bed mobility: Needs Assistance Bed Mobility: Rolling;Supine to Sit Rolling: Mod assist   Supine to sit: Mod assist     General bed mobility comments: Required increased time to complete secondary to pain. Required assist for trunk and LE assist.    Transfers Overall transfer level: Needs assistance Equipment used:  Rolling walker (2 wheeled) Transfers: Sit to/from [employer] Sit to Stand: Mod assist;+2 physical assistance Stand pivot transfers: Min assist;+2 physical assistance       General transfer comment: Mod A +2 for lift assist and steadying to stand. Min A +2 for steadying to get to chair. Very slow, labored steps to chair and required increased time.  Ambulation/Gait                Stairs            Wheelchair Mobility    Modified Rankin (Stroke Patients Only)       Balance Overall balance assessment: Needs assistance Sitting-balance support: Bilateral upper extremity supported;Feet supported Sitting balance-Leahy Scale: Poor Sitting balance - Comments: Reliant on UE support   Standing balance support: Bilateral upper extremity supported Standing balance-Leahy Scale: Poor Standing balance comment: Reliant on BUE support and external support                             Pertinent Vitals/Pain Pain Assessment: Faces Faces Pain Scale: Hurts whole lot Pain Location: abdomen Pain Descriptors / Indicators: Grimacing;Guarding Pain Intervention(s): Limited activity within patient's tolerance;Monitored during session;Repositioned    Home Living Family/patient expects to be discharged to:: Private residence Living Arrangements: Other relatives (niece) Available Help at Discharge: Family;Available PRN/intermittently Type of Home: House Home Access: Stairs to enter Entrance Stairs-Rails:  (not sure if he has a rail) Secretary/administrator of Steps: 3-4 Home Layout: One level Home Equipment: None      Prior Function Level of Independence: Independent  Hand Dominance        Extremity/Trunk Assessment   Upper Extremity Assessment Upper Extremity Assessment: Defer to OT evaluation    Lower Extremity Assessment Lower Extremity Assessment: Generalized weakness    Cervical / Trunk Assessment Cervical / Trunk  Assessment: Other exceptions Cervical / Trunk Exceptions: s/p abdominal surgery  Communication   Communication: No difficulties  Cognition Arousal/Alertness: Awake/alert Behavior During Therapy: Flat affect Overall Cognitive Status: No family/caregiver present to determine baseline cognitive functioning                                 General Comments: Somewhat annoyed with PT presence initially, but eventually agreeable.      General Comments      Exercises     Assessment/Plan    PT Assessment Patient needs continued PT services  PT Problem List Decreased strength;Decreased activity tolerance;Decreased range of motion;Decreased balance;Decreased mobility;Decreased knowledge of use of DME;Decreased knowledge of precautions;Cardiopulmonary status limiting activity;Pain       PT Treatment Interventions DME instruction;Gait training;Therapeutic activities;Functional mobility training;Balance training;Therapeutic exercise;Patient/family education;Stair training    PT Goals (Current goals can be found in the Care Plan section)  Acute Rehab PT Goals Patient Stated Goal: to decrease pain PT Goal Formulation: With patient Time For Goal Achievement: 08/04/21 Potential to Achieve Goals: Good    Frequency Min 2X/week   Barriers to discharge        Co-evaluation               AM-PAC PT "6 Clicks" Mobility  Outcome Measure Help needed turning from your back to your side while in a flat bed without using bedrails?: A Lot Help needed moving from lying on your back to sitting on the side of a flat bed without using bedrails?: A Lot Help needed moving to and from a bed to a chair (including a wheelchair)?: A Lot Help needed standing up from a chair using your arms (e.g., wheelchair or bedside chair)?: Total Help needed to walk in hospital room?: Total Help needed climbing 3-5 steps with a railing? : Total 6 Click Score: 9    End of Session   Activity  Tolerance: Patient limited by pain Patient left: in bed;with call bell/phone within reach;with nursing/sitter in room Nurse Communication: Mobility status PT Visit Diagnosis: Unsteadiness on feet (R26.81);Muscle weakness (generalized) (M62.81);Difficulty in walking, not elsewhere classified (R26.2)    Time: 8315-1761 PT Time Calculation (min) (ACUTE ONLY): 24 min   Charges:   PT Evaluation $PT Eval Moderate Complexity: 1 Mod PT Treatments $Therapeutic Activity: 8-22 mins        Cindee Salt, DPT  Acute Rehabilitation Services  Pager: [phone] Office: [phone]   Lehman Prom 07/21/2021, 4:24 PM

## 2021-07-21 NOTE — Progress Notes (Addendum)
PHARMACY - TOTAL PARENTERAL NUTRITION CONSULT NOTE   Indication: Prolonged ileus  Patient Measurements: Height: 5\' 9"  (175.3 cm) Weight: 59 kg (130 lb) IBW/kg (Calculated) : 70.7 TPN AdjBW (KG): 59 Body mass index is 19.2 kg/m. Usual Weight: 140 lbs  Assessment: 50 yo male presented on 07/10/2021 with abdominal pain, nausea, and vomiting for 3 days prior. PMH significant for polysubstance abuse, homelessness, bipolar disorder, SBO resulting in exlap, LOA and appendectomy at [facility] in May 2020. Patient reported one SBO since that was treated with NGT and improved. Pharmacy consulted to start TPN for SBO, ileus.  Glucose / Insulin: no hx DM. A1C 5.3 in April 2020. CBGs 96-145. 3 units SSI given/24h.  Electrolytes: Na 138, K 4.2, Phos 3.6,  Mg 1.9  - (goal K>=4, Mg >=2 for ileus)  Renal: Scr 0.7, BUN wnl Hepatic: LFTs wnl, t bili wnl. Albumin 1.6. TG 63 Intake / Output; MIVF: NS @100  ml/hr. UOP 1370ml=0.9. NG 50 out (down). Drain 530. LBM 10/3.  ID: WBC up to 29.7 on Zosyn 10/4>> GI Meds: PPI IV for suspected gastritis, scant blood in NG cannister 10/5 (on NSAID ketorolac 10.4-10/7) ID: Tmax 101.5. WBC 29.7 large jump , 10/4: wound cultures mixed. - Zosyn 10/4>>  GI Imaging: - 9/26 xray abd: concerning for SBO - 9/26 CT abd: high grade partial SBO, suspected internal hernia vs. Complex adhesions in RLQ - 9/27 xray abd: continued SBO - 9/28 xray abd: multiple gas filled dilated loops similar to CT -10/4 CT abdomen/pelvis: large dumbbell-shaped pelvic abscess  GI Surgeries / Procedures: - 9/28 exlap, LOA, small intestine repair x2 - 10/4: LLQ drain catheter placement. - 10/5: ex lap with SBR for post-op bowel leak, drain palced  Central access: PICC placed 9/30 TPN start date: 9/30  Nutritional Goals: Goal TPN rate is 85 mL/hr (provides 100 g of protein and 2052 kcals per day)  RD Assessment:  Estimated Needs Total Energy Estimated Needs: 2000-2200 Total Protein  Estimated Needs: 90-110 grams Total Fluid Estimated Needs: >/= 2.0 L  Current Nutrition:  NPO  TPN to start 9/30  Plan: No change to TPN formula today except d/c thiamine Continue TPN  at goal rate of 33mL/hr at 1800 which will provide 100g AA, 61.2g lipids (29.8%), 306g dextrose (GIR 3.6), and 2052 kcal. Electrolytes in TPN: Na 38mEq/L, K 52mEq/L, Ca 40mEq/L, Mg 5mEq/L, and decr Phos 13mmol/L. Cl:Ac 1:1 Add standard MVI and trace elements to TPN D/c thiamine today Continue Sensitive q4h SSI and adjust as needed  MIVF 100 mL/hr based on high NG output ~3917ml/24hr per primary team Monitor TPN labs on Mon/Thurs and prn  Thomas [name] S. 9m, PharmD, BCPS Clinical Staff Pharmacist [email or web address]

## 2021-07-21 NOTE — Progress Notes (Signed)
   07/21/21 1731  Assess: MEWS Score  Temp (!) 100.7 F (38.2 C)  BP 121/81  Pulse Rate (!) 110  Resp 18  SpO2 98 %  O2 Device Room Air  Assess: MEWS Score  MEWS Temp 1  MEWS Systolic 0  MEWS Pulse 1  MEWS RR 0  MEWS LOC 0  MEWS Score 2  MEWS Score Color Yellow  Assess: if the MEWS score is Yellow or Red  Were vital signs taken at a resting state? Yes  Focused Assessment No change from prior assessment  Early Detection of Sepsis Score *See Row Information* Low  MEWS guidelines implemented *See Row Information* Yes  Take Vital Signs  Increase Vital Sign Frequency  Yellow: Q 2hr X 2 then Q 4hr X 2, if remains yellow, continue Q 4hrs  Escalate  MEWS: Escalate Yellow: discuss with charge nurse/RN and consider discussing with provider and RRT  Notify: Charge Nurse/RN  Name of Charge Nurse/RN Notified Park Meo, RN  Date Charge Nurse/RN Notified 07/21/21  Time Charge Nurse/RN Notified 1745  Notify: Provider  Provider Name/Title  (Paged on-call MD through [facility])  Date Provider Notified 07/21/21  Time Provider Notified 1920  Notification Type Page  Notification Reason Change in status  Provider response Other (Comment) (no response by end of shift change report)

## 2021-07-21 NOTE — Progress Notes (Signed)
Contacted PA at [phone]  Pt had pain med every time he could have it last night. Fever overnight 101 as well. He is hiccuping a lot this morning. Something isn't right. Had him up in the chair for little over an hour yesterday. Walking isn't going to be much of an option yet, he is REALLY weak, +2 assist with walker, very unsteady, most of his weight is on the 2 assist to keep him upright.  PA bedside to see pt.  See new orders.   JP drain was unable to be changed to gravity bag after IR brought up bag and assessed. Notified PA. PA talked with MD. MD wanted to try hooking JP to [facility]. RN unable to get supplies to do this.  JP drain is draining stool today.

## 2021-07-21 NOTE — Progress Notes (Signed)
2 Days Post-Op   Chief Complaint/Subjective: Cc  Called to the bedside this AM by RN due to patient with increased pain, fever, belching.  Patient endorses 10/10 pain no better, no worse compared to yesterday.   Afebrile, HR 105 Objective: Vital signs in last 24 hours: Temp:  [98 F (36.7 C)-101.6 F (38.7 C)] 98.4 F (36.9 C) (10/07 0753) Pulse Rate:  [88-110] 100 (10/07 0753) Resp:  [16-19] 16 (10/07 0753) BP: (98-134)/(69-81) 118/78 (10/07 0753) SpO2:  [96 %-100 %] 99 % (10/07 0753) Last BM Date: 07/17/21 Intake/Output from previous day: 10/06 0701 - 10/07 0700 In: 314.5 [IV Piggyback:314.5] Out: 1880 [Urine:1300; Emesis/NG output:50; Drains:530] Intake/Output this shift: Total I/O In: -  Out: 100 [Drains:100]  PE: Gen: NAD Resp: nonlabored, CTAB anterior chest wall Card: RRR Abd: soft, appropriately tender, distended, midline incision with staples c/d/I, hypoactive BS  NG - 50 cc/24h - during my exam the lopez valve was turned to OFF and NG was not functioning. Fixed, with help of RN jaimie during my exam.  JP (placed 10/5) - 500-600 cc/24h, feculent. Consistent with recurrent post-op leak. Lab Results:  Recent Labs    07/19/21 0901 07/20/21 0406  WBC 17.0* 29.7*  HGB 10.9* 11.7*  HCT 33.7* 35.7*  PLT 419* 525*   BMET Recent Labs    07/19/21 0901 07/20/21 0406  NA 138 138  K 3.4* 4.2  CL 101 104  CO2 30 26  GLUCOSE 131* 144*  BUN 20 17  CREATININE 0.72 0.70  CALCIUM 8.0* 8.1*   PT/INR No results for input(s): LABPROT, INR in the last 72 hours.  CMP     Component Value Date/Time   NA 138 07/20/2021 0406   K 4.2 07/20/2021 0406   CL 104 07/20/2021 0406   CO2 26 07/20/2021 0406   GLUCOSE 144 (H) 07/20/2021 0406   BUN 17 07/20/2021 0406   CREATININE 0.70 07/20/2021 0406   CALCIUM 8.1 (L) 07/20/2021 0406   PROT 6.0 (L) 07/20/2021 0406   ALBUMIN 1.6 (L) 07/20/2021 0406   AST 18 07/20/2021 0406   ALT 13 07/20/2021 0406   ALKPHOS 67  07/20/2021 0406   BILITOT 0.4 07/20/2021 0406   GFRNONAA >60 07/20/2021 0406   GFRAA >60 09/05/2019 2005   Lipase     Component Value Date/Time   LIPASE 40 07/10/2021 1008    Studies/Results: No results found.  Anti-infectives: Anti-infectives (From admission, onward)    Start     Dose/Rate Route Frequency Ordered Stop   07/18/21 1000  piperacillin-tazobactam (ZOSYN) IVPB 3.375 g        3.375 g 12.5 mL/hr over 240 Minutes Intravenous Every 8 hours 07/18/21 0839     07/12/21 1200  ceFAZolin (ANCEF) IVPB 2g/100 mL premix        2 g 200 mL/hr over 30 Minutes Intravenous To Short Stay 07/12/21 0843 07/12/21 1100       Assessment/Plan POD 9, S/P : EXPLORATORY LAPAROTOMY; LYSIS OF ADHESIONS; SMALL INTESTINE REPAIR X 2 07/12/21 Dr. Sheliah Hatch for SBO - post-op ileus, fever, leukocytosis, tachycardia >> CT A/P 10/3 showed large pelvic fluid collection - IR placed a drain 10/4 and drain efflux concerning for enteric contents POD 2, S/P LAPAROTOMY, REPAIR OF SMALL BOWEL PERFORATION, PLACEMENT 36F BLAKE DRAIN 10/5 Dr Carolynne Edouard - blake drain with feculent drainage, consistent with recurrent post-op leak. Switch blake drain from bulb suction to gravity bag. Suspect patient will need more drains in order to obtain source control - CT abdomen  and pelvis ordered to assess. - continue NG to LIWS, PICC/TPN - continue IV abx - OOB, mobilize  - incentive spirometry q 1h -continue  PPI for suspected gastritis, scant blood in NG cannister 10/5  Fever - 101.6 due to post-op leak. Continue IV abx. CT A/P as above   FEN - NPO, NG to LIWS, PICC/TNA 9/30 >> VTE - lovenox ID - ancef x1 dose 9/28 perioperatively, Zosyn 10/4 >> Foley -  DC 9/30 Disposition - med-surg, post-op intestinal leak, CT abdomen and pelvis and possible consult to IR for additional perc drains  AKI - resolved  Bipolar disorder Homelessness Polysubstance abuse  Low UOP - resolved   LOS: 11 days   Francine Graven [facility] 07/21/2021, 8:44 AM Please see Amion for pager number during day hours 7:00am-4:30pm or 7:00am -11:30am on weekends
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
## 2021-07-22 LAB — GLUCOSE, CAPILLARY
Glucose-Capillary: 104 mg/dL — ABNORMAL HIGH (ref 70–99)
Glucose-Capillary: 118 mg/dL — ABNORMAL HIGH (ref 70–99)
Glucose-Capillary: 131 mg/dL — ABNORMAL HIGH (ref 70–99)
Glucose-Capillary: 133 mg/dL — ABNORMAL HIGH (ref 70–99)
Glucose-Capillary: 137 mg/dL — ABNORMAL HIGH (ref 70–99)
Glucose-Capillary: 143 mg/dL — ABNORMAL HIGH (ref 70–99)

## 2021-07-22 LAB — BASIC METABOLIC PANEL
Anion gap: 7 (ref 5–15)
BUN: 15 mg/dL (ref 6–20)
CO2: 22 mmol/L (ref 22–32)
Calcium: 8.2 mg/dL — ABNORMAL LOW (ref 8.9–10.3)
Chloride: 103 mmol/L (ref 98–111)
Creatinine, Ser: 0.69 mg/dL (ref 0.61–1.24)
GFR, Estimated: >60 mL/min (ref >60)
Glucose, Bld: 135 mg/dL — ABNORMAL HIGH (ref 70–99)
Potassium: 4.2 mmol/L (ref 3.5–5.1)
Sodium: 132 mmol/L — ABNORMAL LOW (ref 135–145)

## 2021-07-22 LAB — CBC
HCT: 31.8 % — ABNORMAL LOW (ref 39.0–52.0)
Hemoglobin: 10.3 g/dL — ABNORMAL LOW (ref 13.0–17.0)
MCH: 29.6 pg (ref 26.0–34.0)
MCHC: 32.4 g/dL (ref 30.0–36.0)
MCV: 91.4 fL (ref 80.0–100.0)
Platelets: 626 10*3/uL — ABNORMAL HIGH (ref 150–400)
RBC: 3.48 MIL/uL — ABNORMAL LOW (ref 4.22–5.81)
RDW: 13.4 % (ref 11.5–15.5)
WBC: 23.9 10*3/uL — ABNORMAL HIGH (ref 4.0–10.5)
nRBC: 0 % (ref 0.0–0.2)

## 2021-07-22 LAB — MAGNESIUM: Magnesium: 2 mg/dL (ref 1.7–2.4)

## 2021-07-22 MED ORDER — DEXTROSE 70 % IV SOLN
INTRAVENOUS | Status: AC
Start: 2021-07-22 — End: 2021-07-23
  Filled 2021-07-22: qty 999.6

## 2021-07-22 NOTE — Progress Notes (Signed)
OT Cancellation Note  Patient Details Name: MAISON KESTENBAUM MRN: [id number] DOB: 1971/02/24   Cancelled Treatment:    Reason Eval/Treat Not Completed: Patient declined, no reason specified. Will follow.   Alphia Moh OTR/L  Acute Rehab Services  [phone] office number [phone] pager number   Alphia Moh 07/22/2021, 11:56 AM

## 2021-07-22 NOTE — Progress Notes (Signed)
Assessment & Plan: POD#10 - S/P EXPLORATORY LAPAROTOMY; LYSIS OF ADHESIONS; SMALL INTESTINE REPAIR - 07/12/21 Dr. Sheliah Hatch for SBO POD#3 - S/P LAPAROTOMY, REPAIR OF SMALL BOWEL PERFORATION, PLACEMENT 62F BLAKE DRAIN - 07/19/21 Dr Carolynne Edouard - blake drain to gravity bag - continue NG to LIWS, PICC/TPN - continue IV abx - OOB, mobilize  - incentive spirometry q 1h - continue PPI for suspected gastritis, scant blood in NG cannister 10/5 - WBC improved - 23.9 this AM   Fever - Continue IV abx   FEN - NPO, NG to LIWS, PICC/TNA 9/30 >> VTE - lovenox ID - ancef x1 dose 9/28 perioperatively, Zosyn 10/4 >> Foley -  in place  Disposition - med-surg, post-op intestinal leak, CT abdomen and pelvis and possible consult to IR for additional perc drains   AKI - resolved  Bipolar disorder Homelessness Polysubstance abuse         Darnell Level, MD       [facility]       Office: [phone]   Chief Complaint: SBO, post op leak  Subjective: Patient in bed, mild pain.  NPO.  Dressing changed this AM.  Nursing at bedside.  Objective: Vital signs in last 24 hours: Temp:  [98.5 F (36.9 C)-100.7 F (38.2 C)] 98.5 F (36.9 C) (10/08 0809) Pulse Rate:  [109-115] 115 (10/08 0809) Resp:  [17-19] 19 (10/08 0809) BP: (114-133)/(77-81) 114/78 (10/08 0809) SpO2:  [94 %-100 %] 96 % (10/08 0809) Weight:  [57.6 kg] 57.6 kg (10/07 1416) Last BM Date: 07/17/21  Intake/Output from previous day: 10/07 0701 - 10/08 0700 In: 0  Out: 4400 [Urine:2800; Emesis/NG output:1200; Drains:400] Intake/Output this shift: Total I/O In: -  Out: 1340 [Emesis/NG output:1200; Drains:140]  Physical Exam: HEENT - sclerae clear, mucous membranes moist; diaphoretic Neck - soft Chest - clear bilaterally Cor - RRR Abdomen - soft, mild distension; dressing dry and intact; JP with small thin brownish output Ext - no edema, non-tender Neuro - alert & oriented, no focal deficits  Lab Results:   Recent Labs    07/21/21 1353 07/22/21 0413  WBC 31.5* 23.9*  HGB 10.3* 10.3*  HCT 31.3* 31.8*  PLT 569* 626*   BMET Recent Labs    07/20/21 0406  NA 138  K 4.2  CL 104  CO2 26  GLUCOSE 144*  BUN 17  CREATININE 0.70  CALCIUM 8.1*   PT/INR No results for input(s): LABPROT, INR in the last 72 hours. Comprehensive Metabolic Panel:    Component Value Date/Time   NA 138 07/20/2021 0406   NA 138 07/19/2021 0901   K 4.2 07/20/2021 0406   K 3.4 (L) 07/19/2021 0901   CL 104 07/20/2021 0406   CL 101 07/19/2021 0901   CO2 26 07/20/2021 0406   CO2 30 07/19/2021 0901   BUN 17 07/20/2021 0406   BUN 20 07/19/2021 0901   CREATININE 0.70 07/20/2021 0406   CREATININE 0.72 07/19/2021 0901   GLUCOSE 144 (H) 07/20/2021 0406   GLUCOSE 131 (H) 07/19/2021 0901   CALCIUM 8.1 (L) 07/20/2021 0406   CALCIUM 8.0 (L) 07/19/2021 0901   AST 18 07/20/2021 0406   AST 15 07/17/2021 0333   ALT 13 07/20/2021 0406   ALT 13 07/17/2021 0333   ALKPHOS 67 07/20/2021 0406   ALKPHOS 49 07/17/2021 0333   BILITOT 0.4 07/20/2021 0406   BILITOT 0.3 07/17/2021 0333   PROT 6.0 (L) 07/20/2021 0406   PROT 6.0 (L) 07/17/2021 5093  ALBUMIN 1.6 (L) 07/20/2021 0406   ALBUMIN 2.0 (L) 07/17/2021 0333    Studies/Results: CT ABDOMEN PELVIS WO CONTRAST  Result Date: 07/21/2021 CLINICAL DATA:  Postoperative bowel leak, intra-abdominal abscess status post drainage EXAM: CT ABDOMEN AND PELVIS WITHOUT CONTRAST TECHNIQUE: Multidetector CT imaging of the abdomen and pelvis was performed following the standard protocol without IV contrast. Unenhanced CT was performed per clinician order. Lack of IV contrast limits sensitivity and specificity, especially for evaluation of abdominal/pelvic solid viscera. COMPARISON:  07/17/2021 FINDINGS: Lower chest: Dependent hypoventilatory changes. Trace right pleural effusion. Hepatobiliary: Unenhanced imaging of the liver and gallbladder demonstrates no focal abnormalities. Pancreas:  Evaluation severely limited without contrast. No gross pancreatic abnormalities. Spleen: Evaluation limited by streak artifact and lack of contrast. No gross abnormalities. Adrenals/Urinary Tract: No urinary tract calculi or obstructive uropathy. Bladder is decompressed with a Foley catheter. The adrenals are not well visualized. Stomach/Bowel: Enteric catheter tip within the gastric lumen. Evaluation of the bowel is limited without intravenous and oral contrast in this patient with minimal intraperitoneal fat. There is moderate gas and stool within the colon. No evidence of high-grade small bowel obstruction. Assessment for bowel leak is not possible without oral contrast. Vascular/Lymphatic: Stable atherosclerosis. No gross adenopathy on this limited unenhanced exam. Reproductive: No gross abnormalities. Other: The dumbbell-shaped pelvic abscess seen previously is again identified, now measuring approximately 12.2 x 7.6 cm. There is a percutaneous drainage catheter identified coiled within this abscess, which demonstrates minimal change since previous exam. There is free intra-abdominal gas and fluid seen throughout the upper abdomen surrounding the liver, without appreciable change since prior study. Musculoskeletal: No acute or destructive bony lesions. Reconstructed images demonstrate no additional findings. IMPRESSION: 1. No significant change in the size of the known bilobed pelvic abscess, with interval percutaneous drainage catheter placement as above. 2. Stable free fluid and free gas within the upper abdomen. 3. No evidence of bowel obstruction. Assessment for bowel leak cannot be performed without oral contrast. 4. Dependent lower lobe atelectasis and trace right pleural effusion. 5. Otherwise unremarkable limited evaluation without intravenous and oral contrast. 6.  Aortic Atherosclerosis (ICD10-I70.0). Electronically Signed   By: Sharlet Salina M.D.   On: 07/21/2021 19:05      Darnell Level 07/22/2021   Patient ID: Thomas [name], male   DOB: 1971/08/27, 50 y.o.   MRN: [id number]

## 2021-07-22 NOTE — Progress Notes (Signed)
Large amount of light brown fecal smelling drainage from JP . Bulb filing up so fast so it was connected to a very low intermittent wall  suction to manage output. MD aware .
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
## 2021-07-23 LAB — CBC
HCT: 31.7 % — ABNORMAL LOW (ref 39.0–52.0)
Hemoglobin: 10.1 g/dL — ABNORMAL LOW (ref 13.0–17.0)
MCH: 29.2 pg (ref 26.0–34.0)
MCHC: 31.9 g/dL (ref 30.0–36.0)
MCV: 91.6 fL (ref 80.0–100.0)
Platelets: 631 10*3/uL — ABNORMAL HIGH (ref 150–400)
RBC: 3.46 MIL/uL — ABNORMAL LOW (ref 4.22–5.81)
RDW: 13 % (ref 11.5–15.5)
WBC: 14.4 10*3/uL — ABNORMAL HIGH (ref 4.0–10.5)
nRBC: 0 % (ref 0.0–0.2)

## 2021-07-23 LAB — MAGNESIUM: Magnesium: 1.8 mg/dL (ref 1.7–2.4)

## 2021-07-23 LAB — BASIC METABOLIC PANEL
Anion gap: 8 (ref 5–15)
BUN: 14 mg/dL (ref 6–20)
CO2: 24 mmol/L (ref 22–32)
Calcium: 8.2 mg/dL — ABNORMAL LOW (ref 8.9–10.3)
Chloride: 104 mmol/L (ref 98–111)
Creatinine, Ser: 0.67 mg/dL (ref 0.61–1.24)
GFR, Estimated: >60 mL/min (ref >60)
Glucose, Bld: 131 mg/dL — ABNORMAL HIGH (ref 70–99)
Potassium: 3.9 mmol/L (ref 3.5–5.1)
Sodium: 136 mmol/L (ref 135–145)

## 2021-07-23 LAB — GLUCOSE, CAPILLARY
Glucose-Capillary: 116 mg/dL — ABNORMAL HIGH (ref 70–99)
Glucose-Capillary: 127 mg/dL — ABNORMAL HIGH (ref 70–99)
Glucose-Capillary: 129 mg/dL — ABNORMAL HIGH (ref 70–99)

## 2021-07-23 MED ORDER — POTASSIUM CHLORIDE 10 MEQ/50ML IV SOLN
10.0000 meq | INTRAVENOUS | Status: AC
  Administered 2021-07-23 (×2): 10 meq via INTRAVENOUS
  Filled 2021-07-23 (×2): qty 50

## 2021-07-23 MED ORDER — MAGNESIUM SULFATE 2 GM/50ML IV SOLN
2.0000 g | Freq: Once | INTRAVENOUS | Status: AC
  Administered 2021-07-23: 2 g via INTRAVENOUS
  Filled 2021-07-23: qty 50

## 2021-07-23 MED ORDER — TRAVASOL 10 % IV SOLN
INTRAVENOUS | Status: AC
  Filled 2021-07-23: qty 999.6
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
## 2021-07-23 NOTE — Progress Notes (Addendum)
OT Cancellation Note  Patient Details Name: Thomas [name] MRN: [id number] DOB: 06-22-1971   Cancelled Treatment:    Reason Eval/Treat Not Completed: Patient declined, no reason specified;Other (comment) (Pt resting upon arrival. Obtained brief history and pt declined further assessment agitated and stating "you all don't think I have a brain," "I am not stupid," "I know what I need to do." Despite encouragement and attempt at rapport, pt declined.) OT evaluation to f/u as pt allows    History: does not drive, manages his own medication, "crawls" for mobility, last he worked was 1.5 months ago, indep in all ADLs.   Golden Gilreath A Senetra Dillin 07/23/2021, 9:45 AM

## 2021-07-23 NOTE — Progress Notes (Signed)
Assessment & Plan: POD#11 - S/P EXPLORATORY LAPAROTOMY; LYSIS OF ADHESIONS; SMALL INTESTINE REPAIR - 07/12/21 Dr. Sheliah Hatch for SBO POD#4 - S/P LAPAROTOMY, REPAIR OF SMALL BOWEL PERFORATION, PLACEMENT 4F BLAKE DRAIN - 07/19/21 Dr Carolynne Edouard - blake drain to intermittent wall suction - high output, appears to be succus - continue NG to LIWS - PICC/TPN - continue IV abx - OOB, mobilize  - incentive spirometry q 1h - continue PPI for suspected gastritis - WBC improved - 14.4 this AM   FEN - NPO, NG to LIWS, PICC/TNA 9/30 >> VTE - lovenox ID - ancef x1 dose 9/28 perioperatively, Zosyn 10/4 >> Foley -  in place  Bipolar disorder Homelessness Polysubstance abuse  Will need to monitor volume of output from surgical drain.  May need imaging (CT) and possible additional drains or "upsizing"of surgical drain to control fistulae.  Discuss with IR on Monday.        Darnell Level, MD       [facility]       Office: [phone]   Chief Complaint: SBO, small bowel fistula  Subjective: Patient in bed, appears comfortable.  Waiting to watch football.  Objective: Vital signs in last 24 hours: Temp:  [98 F (36.7 C)-98.7 F (37.1 C)] 98 F (36.7 C) (10/09 0416) Pulse Rate:  [89-110] 96 (10/09 0416) Resp:  [17] 17 (10/08 1959) BP: (110-116)/(69-79) 116/79 (10/09 0416) SpO2:  [97 %-100 %] 97 % (10/09 0416) Last BM Date: 08/27/21  Intake/Output from previous day: 10/08 0701 - 10/09 0700 In: 2370 [I.V.:2220; IV Piggyback:150] Out: 4790 [Urine:1025; Emesis/NG output:2650; Drains:1115] Intake/Output this shift: No intake/output data recorded.  Physical Exam: HEENT - sclerae clear, mucous membranes moist Neck - soft Abdomen - soft without distension; dressing intact; midline wound packed; drain with large volume succus in cannister Ext - no edema, non-tender Neuro - alert & oriented, no focal deficits  Lab Results:  Recent Labs    07/22/21 0413 07/23/21 0328   WBC 23.9* 14.4*  HGB 10.3* 10.1*  HCT 31.8* 31.7*  PLT 626* 631*   BMET Recent Labs    07/22/21 0921 07/23/21 0328  NA 132* 136  K 4.2 3.9  CL 103 104  CO2 22 24  GLUCOSE 135* 131*  BUN 15 14  CREATININE 0.69 0.67  CALCIUM 8.2* 8.2*   PT/INR No results for input(s): LABPROT, INR in the last 72 hours. Comprehensive Metabolic Panel:    Component Value Date/Time   NA 136 07/23/2021 0328   NA 132 (L) 07/22/2021 0921   K 3.9 07/23/2021 0328   K 4.2 07/22/2021 0921   CL 104 07/23/2021 0328   CL 103 07/22/2021 0921   CO2 24 07/23/2021 0328   CO2 22 07/22/2021 0921   BUN 14 07/23/2021 0328   BUN 15 07/22/2021 0921   CREATININE 0.67 07/23/2021 0328   CREATININE 0.69 07/22/2021 0921   GLUCOSE 131 (H) 07/23/2021 0328   GLUCOSE 135 (H) 07/22/2021 0921   CALCIUM 8.2 (L) 07/23/2021 0328   CALCIUM 8.2 (L) 07/22/2021 0921   AST 18 07/20/2021 0406   AST 15 07/17/2021 0333   ALT 13 07/20/2021 0406   ALT 13 07/17/2021 0333   ALKPHOS 67 07/20/2021 0406   ALKPHOS 49 07/17/2021 0333   BILITOT 0.4 07/20/2021 0406   BILITOT 0.3 07/17/2021 0333   PROT 6.0 (L) 07/20/2021 0406   PROT 6.0 (L) 07/17/2021 0333   ALBUMIN 1.6 (L) 07/20/2021 0406   ALBUMIN 2.0 (L) 07/17/2021  9563    Studies/Results: CT ABDOMEN PELVIS WO CONTRAST  Result Date: 07/21/2021 CLINICAL DATA:  Postoperative bowel leak, intra-abdominal abscess status post drainage EXAM: CT ABDOMEN AND PELVIS WITHOUT CONTRAST TECHNIQUE: Multidetector CT imaging of the abdomen and pelvis was performed following the standard protocol without IV contrast. Unenhanced CT was performed per clinician order. Lack of IV contrast limits sensitivity and specificity, especially for evaluation of abdominal/pelvic solid viscera. COMPARISON:  07/17/2021 FINDINGS: Lower chest: Dependent hypoventilatory changes. Trace right pleural effusion. Hepatobiliary: Unenhanced imaging of the liver and gallbladder demonstrates no focal abnormalities. Pancreas:  Evaluation severely limited without contrast. No gross pancreatic abnormalities. Spleen: Evaluation limited by streak artifact and lack of contrast. No gross abnormalities. Adrenals/Urinary Tract: No urinary tract calculi or obstructive uropathy. Bladder is decompressed with a Foley catheter. The adrenals are not well visualized. Stomach/Bowel: Enteric catheter tip within the gastric lumen. Evaluation of the bowel is limited without intravenous and oral contrast in this patient with minimal intraperitoneal fat. There is moderate gas and stool within the colon. No evidence of high-grade small bowel obstruction. Assessment for bowel leak is not possible without oral contrast. Vascular/Lymphatic: Stable atherosclerosis. No gross adenopathy on this limited unenhanced exam. Reproductive: No gross abnormalities. Other: The dumbbell-shaped pelvic abscess seen previously is again identified, now measuring approximately 12.2 x 7.6 cm. There is a percutaneous drainage catheter identified coiled within this abscess, which demonstrates minimal change since previous exam. There is free intra-abdominal gas and fluid seen throughout the upper abdomen surrounding the liver, without appreciable change since prior study. Musculoskeletal: No acute or destructive bony lesions. Reconstructed images demonstrate no additional findings. IMPRESSION: 1. No significant change in the size of the known bilobed pelvic abscess, with interval percutaneous drainage catheter placement as above. 2. Stable free fluid and free gas within the upper abdomen. 3. No evidence of bowel obstruction. Assessment for bowel leak cannot be performed without oral contrast. 4. Dependent lower lobe atelectasis and trace right pleural effusion. 5. Otherwise unremarkable limited evaluation without intravenous and oral contrast. 6.  Aortic Atherosclerosis (ICD10-I70.0). Electronically Signed   By: Sharlet Salina M.D.   On: 07/21/2021 19:05      Darnell Level 07/23/2021   Patient ID: Thomas [name], male   DOB: 1971/10/03, 50 y.o.   MRN: [id number]

## 2021-07-24 LAB — COMPREHENSIVE METABOLIC PANEL
ALT: 21 U/L (ref 0–44)
AST: 18 U/L (ref 15–41)
Albumin: 1.7 g/dL — ABNORMAL LOW (ref 3.5–5.0)
Alkaline Phosphatase: 101 U/L (ref 38–126)
Anion gap: 7 (ref 5–15)
BUN: 12 mg/dL (ref 6–20)
CO2: 25 mmol/L (ref 22–32)
Calcium: 8.5 mg/dL — ABNORMAL LOW (ref 8.9–10.3)
Chloride: 102 mmol/L (ref 98–111)
Creatinine, Ser: 0.64 mg/dL (ref 0.61–1.24)
GFR, Estimated: >60 mL/min (ref >60)
Glucose, Bld: 124 mg/dL — ABNORMAL HIGH (ref 70–99)
Potassium: 4.4 mmol/L (ref 3.5–5.1)
Sodium: 134 mmol/L — ABNORMAL LOW (ref 135–145)
Total Bilirubin: 0.3 mg/dL (ref 0.3–1.2)
Total Protein: 7.4 g/dL (ref 6.5–8.1)

## 2021-07-24 LAB — GLUCOSE, CAPILLARY
Glucose-Capillary: 108 mg/dL — ABNORMAL HIGH (ref 70–99)
Glucose-Capillary: 120 mg/dL — ABNORMAL HIGH (ref 70–99)
Glucose-Capillary: 124 mg/dL — ABNORMAL HIGH (ref 70–99)
Glucose-Capillary: 133 mg/dL — ABNORMAL HIGH (ref 70–99)
Glucose-Capillary: 134 mg/dL — ABNORMAL HIGH (ref 70–99)
Glucose-Capillary: 147 mg/dL — ABNORMAL HIGH (ref 70–99)

## 2021-07-24 LAB — MAGNESIUM: Magnesium: 2 mg/dL (ref 1.7–2.4)

## 2021-07-24 LAB — TRIGLYCERIDES: Triglycerides: 38 mg/dL (ref <150)

## 2021-07-24 LAB — PHOSPHORUS: Phosphorus: 3.3 mg/dL (ref 2.5–4.6)

## 2021-07-24 MED ORDER — MORPHINE SULFATE (PF) 2 MG/ML IV SOLN
2.0000 mg | INTRAVENOUS | Status: DC | PRN
  Administered 2021-07-24 – 2021-07-26 (×12): 4 mg via INTRAVENOUS
  Administered 2021-07-26: 2 mg via INTRAVENOUS
  Administered 2021-07-26 (×6): 4 mg via INTRAVENOUS
  Administered 2021-07-27 – 2021-07-28 (×10): 2 mg via INTRAVENOUS
  Filled 2021-07-24 (×3): qty 2
  Filled 2021-07-24: qty 1
  Filled 2021-07-24 (×3): qty 2
  Filled 2021-07-24 (×5): qty 1
  Filled 2021-07-24 (×2): qty 2
  Filled 2021-07-24 (×3): qty 1
  Filled 2021-07-24 (×2): qty 2
  Filled 2021-07-24 (×2): qty 1
  Filled 2021-07-24 (×9): qty 2

## 2021-07-24 MED ORDER — TRAVASOL 10 % IV SOLN
INTRAVENOUS | Status: AC
  Filled 2021-07-24: qty 999.6

## 2021-07-24 NOTE — Progress Notes (Signed)
Pt refused getting out of bed today and walking. States he is in too much pain. Pain medication given and pt rechecked to see if he would like to walk and pt refused again.

## 2021-07-24 NOTE — Anesthesia Postprocedure Evaluation (Signed)
Anesthesia Post Note  Patient: Thomas [name]  Procedure(s) Performed: EXPLORATORY LAPAROTOMY SMALL BOWEL REPAIR OF PERFORATION AND DRAINAGE OF INTERABDOMINAL CYST (Abdomen)     Patient location during evaluation: PACU Anesthesia Type: General Level of consciousness: awake and alert Pain management: pain level controlled Vital Signs Assessment: post-procedure vital signs reviewed and stable Respiratory status: spontaneous breathing, nonlabored ventilation, respiratory function stable and patient connected to nasal cannula oxygen Cardiovascular status: blood pressure returned to baseline, stable and tachycardic Postop Assessment: no apparent nausea or vomiting Anesthetic complications: no   No notable events documented.  Last Vitals:  Vitals:   07/24/21 0913 07/24/21 0915  BP: 120/79   Pulse: (!) 111 (!) 109  Resp: 18   Temp: 36.9 C   SpO2: 95%      Pain Goal: Patients Stated Pain Goal: 7 (07/23/21 1923)                 Beryle Lathe

## 2021-07-24 NOTE — Progress Notes (Addendum)
PHARMACY - TOTAL PARENTERAL NUTRITION CONSULT NOTE  Indication: SBO/Prolonged ileus post-op  Patient Measurements: Height: 5\' 9"  (175.3 cm) Weight: 57.6 kg (126 lb 15.8 oz) IBW/kg (Calculated) : 70.7 TPN AdjBW (KG): 59 Body mass index is 18.75 kg/m. Usual Weight: 140 lbs  Assessment:  50 YOM presented on 07/10/2021 with abdominal pain, nausea, and vomiting for 3 days. PMH significant for polysubstance abuse, homelessness, bipolar disorder, SBO resulting in exlap, LOA and appendectomy at [facility] in May 2020. Patient reported one SBO since that was treated with NGT and improved. Pharmacy consulted to manage TPN for SBO, ileus.  Glucose / Insulin: no hx DM, A1c 5.3%. CBGs well controlled.   SSI/CBG checks D/C'ed 07/22/21. Electrolytes: Coca 10.3, others WNL  Renal: SCr 0.6, BUN WNL Hepatic: LFTs / tbili / TG WNL, albumin 1.7 Intake / Output; MIVF: UOP 1.3 ml/kg/hr, JP drain 09/21/21 (feculent output on LIWS), NS at 100 ml/hr, LBM 10/8, NG output not documented but dark and bilious per surgery note, net -5.4L ID: Zosyn (10/4 >> ) for IAI - now afebrile WBC down to 14.4 GI Imaging: - 9/26 xray abd: concerning for SBO - 9/26 CT: high grade pSBO, suspected internal hernia vs. complex adhesions in RLQ - 9/27 xray abd: continued SBO - 9/28 xray abd: multiple gas filled dilated loops similar to CT - 10/4 CT abdomen/pelvis: large dumbbell-shaped pelvic abscess - 10/7 CT - no change pelvic abscess, no evidence of bowel obstruction GI Surgeries / Procedures: - 9/28 exlap, LOA, small intestine repair x2 - 10/4: LLQ drain catheter placement. - 10/5: ex lap with SBR for post-op bowel leak, drain placed  Central access: PICC placed 07/14/21 TPN start date: 07/14/21  Nutritional Goals:  RD Estimated Needs Total Energy Estimated Needs: 2000-2200 Total Protein Estimated Needs: 90-110 grams Total Fluid Estimated Needs: >/= 2.0 L  Current Nutrition:  TPN  Plan: Continue TPN at goal rate of  63mL/hr at 1800 to provide 100g AA, 61g lLE and 306g CHO for a total of 2052 kCal, meeting 100% of patient needs Electrolytes in TPN: Increase Na to 125 mEq/L, Mg 3 mEq/L, Phos 13 mmol/L; Decrease K 40 mEq/L, Ca 3 mEq/L; Continue Cl:Ac 1:1 Add standard MVI and trace elements to TPN.  Off thiamine 07/21/21. NS at 100 ml/hr to keep up with drain output per MD Standard TPN labs 10/11 and on Mon and Thurs   Sun, PharmD, BCPS, [facility] Clinical Pharmacist  Please check AMION for all [facility] Pharm[facility] phone numbers After 10:00 PM, call Main Pharmacy [phone]

## 2021-07-24 NOTE — Evaluation (Addendum)
Occupational Therapy Evaluation Patient Details Name: Thomas [name] MRN: [id number] DOB: 1970-12-29 Today's Date: 07/24/2021   History of Present Illness 50 y/o male admitted 9/26 secondary to nausea/vomiting, and abdominal pain. Found to have SBO and had NG tube placed. Pt is s/p lysis of adhesions and small intestine repaire on 9/28. Pt with LLQ abscess and had percutaneous drain placement on 10/4. Pt found to have post op bowel leak and is s/p repair of small bowel perforation on 10/5. PMH includes bipolar disorder, tobacco use and substance abuse.   Clinical Impression   PTA,  pt was living with his niece and was independent per his report.  Pt currently requiring Min A for UB ADLs, Max A for LB ADLs, Min A for bed mobility and Min A for sit<>stand from EOB. Pt limited by pain. Pt also presenting with decreased safety awareness and insight; fluctuating quickly in moods. Pt would benefit from further acute OT to facilitate safe dc. Recommend dc to SNF for further OT to optimize safety, independence with ADLs, and return to PLOF.      Recommendations for follow up therapy are one component of a multi-disciplinary discharge planning process, led by the attending physician.  Recommendations may be updated based on patient status, additional functional criteria and insurance authorization.   Follow Up Recommendations  SNF    Equipment Recommendations  None recommended by OT    Recommendations for Other Services PT consult     Precautions / Restrictions Precautions Precautions: Other (comment);Fall Precaution Comments: JP drain, NG tube      Mobility Bed Mobility Overal bed mobility: Needs Assistance Bed Mobility: Supine to Sit;Sit to Supine     Supine to sit: Min assist;HOB elevated Sit to supine: Min guard;HOB elevated   General bed mobility comments: Min A for elevating trunk. Min Guard A for safety while pt returned to bed    Transfers Overall transfer level: Needs  assistance Equipment used: 1 person hand held assist Transfers: Sit to/from [employer] Sit to Stand: Min assist         General transfer comment: Min A for power up    Balance Overall balance assessment: Needs assistance Sitting-balance support: Bilateral upper extremity supported;Feet supported Sitting balance-Leahy Scale: Poor Sitting balance - Comments: Reliant on UE support   Standing balance support: Bilateral upper extremity supported Standing balance-Leahy Scale: Poor Standing balance comment: Reliant on BUE support and external support                           ADL either performed or assessed with clinical judgement   ADL Overall ADL's : Needs assistance/impaired Eating/Feeding: NPO   Grooming: Set up;Bed level   Upper Body Bathing: Minimal assistance;Bed level   Lower Body Bathing: Maximal assistance;Sit to/from stand;Bed level   Upper Body Dressing : Minimal assistance;Sitting   Lower Body Dressing: Maximal assistance;Bed level;Sit to/from stand                 General ADL Comments: Pt performing sit<>stand at EOB and holding while NT changed sheets. Pt then taking side steps to [facility]     Vision         Perception     Praxis      Pertinent Vitals/Pain Pain Assessment: Faces Faces Pain Scale: Hurts whole lot Pain Location: abdomen Pain Descriptors / Indicators: Grimacing;Guarding Pain Intervention(s): Monitored during session;Repositioned     Hand Dominance     Extremity/Trunk Assessment  Upper Extremity Assessment Upper Extremity Assessment: Generalized weakness   Lower Extremity Assessment Lower Extremity Assessment: Defer to PT evaluation   Cervical / Trunk Assessment Cervical / Trunk Assessment: Other exceptions Cervical / Trunk Exceptions: s/p abdominal surgery   Communication Communication Communication: No difficulties   Cognition Arousal/Alertness: Awake/alert Behavior During Therapy: Flat  affect Overall Cognitive Status: No family/caregiver present to determine baseline cognitive functioning                                 General Comments: baseline bipolar   General Comments       Exercises     Shoulder Instructions      Home Living Family/patient expects to be discharged to:: Private residence Living Arrangements: Other relatives (niece) Available Help at Discharge: Family;Available PRN/intermittently Type of Home: House Home Access: Stairs to enter [employer] of Steps: 3-4 Entrance Stairs-Rails:  (not sure if he has a rail) Home Layout: One level     Bathroom Shower/Tub: Producer, television/film/video: Handicapped height     Home Equipment: None   Additional Comments: information from chart review      Prior Functioning/Environment Level of Independence: Independent                 OT Problem List: Decreased strength;Decreased range of motion;Decreased activity tolerance;Impaired balance (sitting and/or standing);Decreased knowledge of use of DME or AE;Decreased knowledge of precautions;Decreased cognition;Pain      OT Treatment/Interventions: Self-care/ADL training;Therapeutic exercise;Energy conservation;DME and/or AE instruction;Therapeutic activities;Patient/family education    OT Goals(Current goals can be found in the care plan section) Acute Rehab OT Goals Patient Stated Goal: to decrease pain OT Goal Formulation: With patient Time For Goal Achievement: 08/07/21 Potential to Achieve Goals: Good  OT Frequency: Min 2X/week   Barriers to D/C:            Co-evaluation              AM-PAC OT "6 Clicks" Daily Activity     Outcome Measure Help from another person eating meals?: Total Help from another person taking care of personal grooming?: A Little Help from another person toileting, which includes using toliet, bedpan, or urinal?: A Lot Help from another person bathing (including washing,  rinsing, drying)?: A Lot Help from another person to put on and taking off regular upper body clothing?: A Little Help from another person to put on and taking off regular lower body clothing?: A Lot 6 Click Score: 13   End of Session Nurse Communication: Mobility status  Activity Tolerance: Patient limited by pain Patient left: in bed;with call bell/phone within reach  OT Visit Diagnosis: Unsteadiness on feet (R26.81);Other abnormalities of gait and mobility (R26.89);Muscle weakness (generalized) (M62.81);Pain Pain - part of body:  (abdomen)                Time: 6712-4580 OT Time Calculation (min): 34 min Charges:  OT General Charges $OT Visit: 1 Visit OT Evaluation $OT Eval Moderate Complexity: 1 Mod OT Treatments $Self Care/Home Management : 8-22 mins  Angelina Neece MSOT, OTR/L Acute Rehab Pager: [phone] Office: [phone]  Theodoro Grist Angelicia Lessner 07/24/2021, 4:48 PM

## 2021-07-24 NOTE — Progress Notes (Addendum)
Progress Note  5 Days Post-Op  Subjective:  He complains of his abdomen feeling very tight and painful but this is stable from the last few days. He walked yesterday but refuses to walk today. He denies nausea or emesis. He is passing a lot of flatus but no BM  Objective: Vital signs in last 24 hours: Temp:  [98.1 F (36.7 C)-98.9 F (37.2 C)] 98.7 F (37.1 C) (10/10 0402) Pulse Rate:  [97-110] 110 (10/10 0402) Resp:  [16-18] 17 (10/10 0402) BP: (111-120)/(76-83) 116/76 (10/10 0402) SpO2:  [96 %-99 %] 96 % (10/10 0402) Last BM Date: 08/27/21  Intake/Output from previous day: 10/09 0701 - 10/10 0700 In: 3978 [I.V.:3678; IV Piggyback:300] Out: 5200 [Urine:1850; Drains:3350] Intake/Output this shift: No intake/output data recorded.  PE: General: WD, male who is laying in bed in NAD HEENT: head is normocephalic, atraumatic.  Mouth is pink and moist Heart: regular, rate, and rhythm.  Palpable radial pulses bilaterally Lungs: Respiratory effort nonlabored Abd: soft, appropriate diffuse TTP, dressing c/d/I, BS normal. NG with dark bilious output. Drain with with feculent output and connected to LIWS MSK: all 4 extremities are symmetrical with no cyanosis, clubbing, or edema. No calf TTP bilaterally Skin: warm and dry Psych: A&Ox3 with an appropriate affect.    Lab Results:  Recent Labs    07/22/21 0413 07/23/21 0328  WBC 23.9* 14.4*  HGB 10.3* 10.1*  HCT 31.8* 31.7*  PLT 626* 631*   BMET Recent Labs    07/23/21 0328 07/24/21 0346  NA 136 134*  K 3.9 4.4  CL 104 102  CO2 24 25  GLUCOSE 131* 124*  BUN 14 12  CREATININE 0.67 0.64  CALCIUM 8.2* 8.5*   PT/INR No results for input(s): LABPROT, INR in the last 72 hours. CMP     Component Value Date/Time   NA 134 (L) 07/24/2021 0346   K 4.4 07/24/2021 0346   CL 102 07/24/2021 0346   CO2 25 07/24/2021 0346   GLUCOSE 124 (H) 07/24/2021 0346   BUN 12 07/24/2021 0346   CREATININE 0.64 07/24/2021 0346    CALCIUM 8.5 (L) 07/24/2021 0346   PROT 7.4 07/24/2021 0346   ALBUMIN 1.7 (L) 07/24/2021 0346   AST 18 07/24/2021 0346   ALT 21 07/24/2021 0346   ALKPHOS 101 07/24/2021 0346   BILITOT 0.3 07/24/2021 0346   GFRNONAA >60 07/24/2021 0346   GFRAA >60 09/05/2019 2005   Lipase     Component Value Date/Time   LIPASE 40 07/10/2021 1008       Studies/Results: No results found.  Anti-infectives: Anti-infectives (From admission, onward)    Start     Dose/Rate Route Frequency Ordered Stop   07/18/21 1000  piperacillin-tazobactam (ZOSYN) IVPB 3.375 g        3.375 g 12.5 mL/hr over 240 Minutes Intravenous Every 8 hours 07/18/21 0839     07/12/21 1200  ceFAZolin (ANCEF) IVPB 2g/100 mL premix        2 g 200 mL/hr over 30 Minutes Intravenous To Short Stay 07/12/21 0843 07/12/21 1100        Assessment/Plan  POD#12 - S/P EXPLORATORY LAPAROTOMY; LYSIS OF ADHESIONS; SMALL INTESTINE REPAIR - 07/12/21 Dr. Sheliah Hatch for SBO POD#5 - S/P LAPAROTOMY, REPAIR OF SMALL BOWEL PERFORATION, PLACEMENT 17F BLAKE DRAIN - 07/19/21 Dr Carolynne Edouard - blake drain to intermittent wall suction - high output, appears to be succus - 3350 ml - continue NG to LIWS - PICC/TPN - continue IV abx - OOB, mobilize  -  incentive spirometry q 1h - continue PPI for suspected gastritis - WBC improved yesterday. Repeat tomorrow am   FEN - NPO, NG to LIWS, PICC/TNA 9/30 >> VTE - lovenox ID - ancef x1 dose 9/28 perioperatively, Zosyn 10/4 >> Foley -  in place   Bipolar disorder Homelessness Polysubstance abuse   Will discuss with IR today if able to upsize drain. He may need additional imaging to assess prior   LOS: 14 days    Eric Form, [facility] 07/24/2021, 8:07 AM Please see Amion for pager number during day hours 7:00am-4:30pm

## 2021-07-24 NOTE — Progress Notes (Signed)
MEWS yellow I informed patient that I need to recheck vitals according to the MEWS q2hrs due to increase heart rate he stated that he didn't want me to call MD and that he doesn't want to be bothered with the frequent vital signs he states that his HR will calm down on his own and nothing else is needed. Ilean Skill LPN

## 2021-07-25 ENCOUNTER — Inpatient Hospital Stay (HOSPITAL_COMMUNITY): Payer: Self-pay

## 2021-07-25 HISTORY — PX: IR CATHETER TUBE CHANGE: IMG717

## 2021-07-25 LAB — BASIC METABOLIC PANEL
Anion gap: 5 (ref 5–15)
BUN: 13 mg/dL (ref 6–20)
CO2: 26 mmol/L (ref 22–32)
Calcium: 8.2 mg/dL — ABNORMAL LOW (ref 8.9–10.3)
Chloride: 105 mmol/L (ref 98–111)
Creatinine, Ser: 0.72 mg/dL (ref 0.61–1.24)
GFR, Estimated: >60 mL/min (ref >60)
Glucose, Bld: 122 mg/dL — ABNORMAL HIGH (ref 70–99)
Potassium: 4.3 mmol/L (ref 3.5–5.1)
Sodium: 136 mmol/L (ref 135–145)

## 2021-07-25 LAB — CBC
HCT: 30.8 % — ABNORMAL LOW (ref 39.0–52.0)
Hemoglobin: 9.8 g/dL — ABNORMAL LOW (ref 13.0–17.0)
MCH: 29.1 pg (ref 26.0–34.0)
MCHC: 31.8 g/dL (ref 30.0–36.0)
MCV: 91.4 fL (ref 80.0–100.0)
Platelets: 616 10*3/uL — ABNORMAL HIGH (ref 150–400)
RBC: 3.37 MIL/uL — ABNORMAL LOW (ref 4.22–5.81)
RDW: 12.9 % (ref 11.5–15.5)
WBC: 18.5 10*3/uL — ABNORMAL HIGH (ref 4.0–10.5)
nRBC: 0 % (ref 0.0–0.2)

## 2021-07-25 LAB — GLUCOSE, CAPILLARY
Glucose-Capillary: 115 mg/dL — ABNORMAL HIGH (ref 70–99)
Glucose-Capillary: 121 mg/dL — ABNORMAL HIGH (ref 70–99)
Glucose-Capillary: 126 mg/dL — ABNORMAL HIGH (ref 70–99)
Glucose-Capillary: 127 mg/dL — ABNORMAL HIGH (ref 70–99)

## 2021-07-25 LAB — PHOSPHORUS: Phosphorus: 3.3 mg/dL (ref 2.5–4.6)

## 2021-07-25 LAB — MAGNESIUM: Magnesium: 1.9 mg/dL (ref 1.7–2.4)

## 2021-07-25 MED ORDER — LIDOCAINE HCL 1 % IJ SOLN
INTRAMUSCULAR | Status: AC
  Administered 2021-07-25: 10 mL via SUBCUTANEOUS
  Filled 2021-07-25: qty 20

## 2021-07-25 MED ORDER — TRAVASOL 10 % IV SOLN
INTRAVENOUS | Status: AC
  Filled 2021-07-25: qty 999.6

## 2021-07-25 MED ORDER — IOHEXOL 300 MG/ML  SOLN
25.0000 mL | Freq: Once | INTRAMUSCULAR | Status: AC | PRN
  Administered 2021-07-25: 10 mL

## 2021-07-25 NOTE — Procedures (Signed)
Interventional Radiology Procedure Note  Procedure: Exchange of surgical drain for 65F percutaneous drain, .  Complications: None Recommendations:  - routine wound care - Do not submerge    Signed,  Yvone Neu. Loreta Ave, DO

## 2021-07-25 NOTE — Progress Notes (Signed)
PT Cancellation Note  Patient Details Name: Thomas [name] MRN: [id number] DOB: Nov 11, 1970   Cancelled Treatment:    Reason Eval/Treat Not Completed: Patient declined, no reason specified Pt declined participating in mobility today despite encouragement and coaxing. Will follow.  Blake Divine A Aleese Kamps 07/25/2021, 4:16 PM [place], PT, DPT Acute Rehabilitation Services Pager [phone] Office [phone]

## 2021-07-25 NOTE — Progress Notes (Signed)
   07/25/21 0734  Assess: MEWS Score  Temp 99.8 F (37.7 C)  BP 109/68  Pulse Rate (!) 117  Resp 19  SpO2 98 %  O2 Device Room Air  Assess: MEWS Score  MEWS Temp 0  MEWS Systolic 0  MEWS Pulse 2  MEWS RR 0  MEWS LOC 0  MEWS Score 2  MEWS Score Color Yellow  Assess: if the MEWS score is Yellow or Red  Were vital signs taken at a resting state? Yes  Focused Assessment No change from prior assessment  Early Detection of Sepsis Score *See Row Information* Low  MEWS guidelines implemented *See Row Information* No, previously yellow, continue vital signs every 4 hours  Treat  MEWS Interventions Administered prn meds/treatments  Pain Scale 0-10  Pain Score 5  Pain Type Surgical pain

## 2021-07-25 NOTE — Plan of Care (Signed)
  Problem: Education: Goal: Knowledge of General Education information will improve Description: Including pain rating scale, medication(s)/side effects and non-pharmacologic comfort measures Outcome: Progressing   Problem: Health Behavior/Discharge Planning: Goal: Ability to manage health-related needs will improve Outcome: Progressing   Problem: Clinical Measurements: Goal: Ability to maintain clinical measurements within normal limits will improve Outcome: Progressing Goal: Will remain free from infection Outcome: Progressing Goal: Diagnostic test results will improve Outcome: Progressing   Problem: Activity: Goal: Risk for activity intolerance will decrease Outcome: Not Progressing   Problem: Nutrition: Goal: Adequate nutrition will be maintained Outcome: Progressing   Problem: Coping: Goal: Level of anxiety will decrease Outcome: Progressing   Problem: Elimination: Goal: Will not experience complications related to bowel motility Outcome: Progressing Goal: Will not experience complications related to urinary retention Outcome: Progressing   Problem: Pain Managment: Goal: General experience of comfort will improve Outcome: Not Progressing   Problem: Skin Integrity: Goal: Risk for impaired skin integrity will decrease Outcome: Progressing

## 2021-07-25 NOTE — Progress Notes (Signed)
Progress Note  6 Days Post-Op  Subjective: Abdominal pain is stable. Still passing flatus but no BM. No nausea or emesis. States he feels dizzy. He felt flushed with dilaudid yesterday and feels better with the morphine. States he did get out of bed yesterday   Objective: Vital signs in last 24 hours: Temp:  [98.4 F (36.9 C)-99.8 F (37.7 C)] 99.8 F (37.7 C) (10/11 0734) Pulse Rate:  [109-117] 117 (10/11 0734) Resp:  [18-19] 19 (10/11 0734) BP: (102-120)/(68-86) 109/68 (10/11 0734) SpO2:  [95 %-98 %] 98 % (10/11 0734) Last BM Date: 07/24/21  Intake/Output from previous day: 10/10 0701 - 10/11 0700 In: 2747.9 [I.V.:2208.6; IV Piggyback:539.4] Out: 3650 [Urine:2350; Emesis/NG output:1100; Drains:200] Intake/Output this shift: No intake/output data recorded.  PE: General: WD, male who is laying in bed in NAD HEENT: head is normocephalic, atraumatic.  Mouth is pink and moist Heart: regular, rate, and rhythm.  Palpable radial pulses bilaterally Lungs: Respiratory effort nonlabored Abd: soft, appropriate diffuse TTP, dressing c/d/I - wound bed with red granulation tissue and appropriate bleeding, BS hypoactive. NG with dark bilious output. Drain with with feculent output and connected to LIWS MSK: all 4 extremities are symmetrical with no cyanosis, clubbing, or edema. No calf TTP bilaterally Skin: warm and dry Psych: A&Ox3 with an appropriate affect.    Lab Results:  Recent Labs    07/23/21 0328 07/25/21 0307  WBC 14.4* 18.5*  HGB 10.1* 9.8*  HCT 31.7* 30.8*  PLT 631* 616*    BMET Recent Labs    07/24/21 0346 07/25/21 0307  NA 134* 136  K 4.4 4.3  CL 102 105  CO2 25 26  GLUCOSE 124* 122*  BUN 12 13  CREATININE 0.64 0.72  CALCIUM 8.5* 8.2*    PT/INR No results for input(s): LABPROT, INR in the last 72 hours. CMP     Component Value Date/Time   NA 136 07/25/2021 0307   K 4.3 07/25/2021 0307   CL 105 07/25/2021 0307   CO2 26 07/25/2021 0307    GLUCOSE 122 (H) 07/25/2021 0307   BUN 13 07/25/2021 0307   CREATININE 0.72 07/25/2021 0307   CALCIUM 8.2 (L) 07/25/2021 0307   PROT 7.4 07/24/2021 0346   ALBUMIN 1.7 (L) 07/24/2021 0346   AST 18 07/24/2021 0346   ALT 21 07/24/2021 0346   ALKPHOS 101 07/24/2021 0346   BILITOT 0.3 07/24/2021 0346   GFRNONAA >60 07/25/2021 0307   GFRAA >60 09/05/2019 2005   Lipase     Component Value Date/Time   LIPASE 40 07/10/2021 1008       Studies/Results: No results found.  Anti-infectives: Anti-infectives (From admission, onward)    Start     Dose/Rate Route Frequency Ordered Stop   07/18/21 1000  piperacillin-tazobactam (ZOSYN) IVPB 3.375 g        3.375 g 12.5 mL/hr over 240 Minutes Intravenous Every 8 hours 07/18/21 0839     07/12/21 1200  ceFAZolin (ANCEF) IVPB 2g/100 mL premix        2 g 200 mL/hr over 30 Minutes Intravenous To Short Stay 07/12/21 0843 07/12/21 1100        Assessment/Plan  POD#13 - S/P EXPLORATORY LAPAROTOMY; LYSIS OF ADHESIONS; SMALL INTESTINE REPAIR - 07/12/21 Dr. Sheliah Hatch for SBO POD#6 - S/P LAPAROTOMY, REPAIR OF SMALL BOWEL PERFORATION, PLACEMENT 21F BLAKE DRAIN - 07/19/21 Dr Carolynne Edouard - blake drain to intermittent wall suction - high output, appears to be succus - 200 ml yesterday according to chart -  continue NG to LIWS (1100 ml) - + flatus but high output - PICC/TPN - continue IV abx - OOB, mobilize  - incentive spirometry q 1h - continue PPI for suspected gastritis - WBC 18.5 (14.4), afebrile   FEN - NPO, NG to LIWS, PICC/TNA 9/30 >> VTE - lovenox ID - ancef x1 dose 9/28 perioperatively, Zosyn 10/4 >> Foley -  void trial today   Bipolar disorder Homelessness Polysubstance abuse   Will touch base with IR today if able to upsize drain. He may need additional imaging to assess prior   LOS: 15 days    Eric Form, [facility] 07/25/2021, 7:54 AM Please see Amion for pager number during day hours 7:00am-4:30pm
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
## 2021-07-26 LAB — GLUCOSE, CAPILLARY
Glucose-Capillary: 103 mg/dL — ABNORMAL HIGH (ref 70–99)
Glucose-Capillary: 115 mg/dL — ABNORMAL HIGH (ref 70–99)
Glucose-Capillary: 121 mg/dL — ABNORMAL HIGH (ref 70–99)
Glucose-Capillary: 124 mg/dL — ABNORMAL HIGH (ref 70–99)
Glucose-Capillary: 130 mg/dL — ABNORMAL HIGH (ref 70–99)
Glucose-Capillary: 97 mg/dL (ref 70–99)

## 2021-07-26 MED ORDER — TRAVASOL 10 % IV SOLN
INTRAVENOUS | Status: AC
  Filled 2021-07-26: qty 999.6

## 2021-07-26 NOTE — Progress Notes (Signed)
   07/25/21 2017  Assess: MEWS Score  Temp 98.1 F (36.7 C)  BP 109/70  Pulse Rate (!) 111  Resp 20  SpO2 98 %  Assess: MEWS Score  MEWS Temp 0  MEWS Systolic 0  MEWS Pulse 2  MEWS RR 0  MEWS LOC 0  MEWS Score 2  MEWS Score Color Yellow  Assess: if the MEWS score is Yellow or Red  Were vital signs taken at a resting state? Yes  Focused Assessment No change from prior assessment  Early Detection of Sepsis Score *See Row Information* Low  MEWS guidelines implemented *See Row Information* No, previously yellow, continue vital signs every 4 hours

## 2021-07-26 NOTE — Progress Notes (Signed)
Physical Therapy Treatment Patient Details Name: Thomas [name] MRN: [id number] DOB: Mar 31, 1971 Today's Date: 07/26/2021   History of Present Illness 50 y/o male admitted 9/26 secondary to nausea/vomiting, and abdominal pain. Found to have SBO and had NG tube placed. Pt is s/p lysis of adhesions and small intestine repaire on 9/28. Pt with LLQ abscess and had percutaneous drain placement on 10/4. Pt found to have post op bowel leak and is s/p repair of small bowel perforation on 10/5. Drain exchange 07/25/21 PMH includes bipolar disorder, tobacco use and substance abuse.    PT Comments    Pt limited by pain during session. Pt initially agreeable to transfer to recliner. Pt requires assistance for safety, line management, and physical assistance to ensure pt completes stand pivot transfers and does not sit into floor. Pt becomes intermittently agitated throughout session when PT attempts to assess symptoms, reporting that the therapist is not paying attention to the pt. PT attempts to encourage pt to participate in more transfer training or gait training however pt is not agreeable to this. After PT assist with changing bed linens the pt elects to return to bed despite PT encouragement to sit in recliner for increased time. PT will attempt to continue to provide services in an effort to improve activity tolerance and reduce falls risk.    Recommendations for follow up therapy are one component of a multi-disciplinary discharge planning process, led by the attending physician.  Recommendations may be updated based on patient status, additional functional criteria and insurance authorization.  Follow Up Recommendations  SNF;Supervision for mobility/OOB     Equipment Recommendations  Rolling walker with 5" wheels;Wheelchair (measurements PT)    Recommendations for Other Services       Precautions / Restrictions Precautions Precautions: Other (comment);Fall Precaution Comments: abdominal drain  to foley bag, NG tube Restrictions Weight Bearing Restrictions: No     Mobility  Bed Mobility Overal bed mobility: Needs Assistance Bed Mobility: Rolling;Sidelying to Sit;Sit to Supine Rolling: Supervision Sidelying to sit: Supervision;HOB elevated   Sit to supine: Supervision;HOB elevated   General bed mobility comments: significant increased time, heavy reliance on railing    Transfers Overall transfer level: Needs assistance Equipment used: Rolling walker (2 wheeled) Transfers: Sit to/from [employer] Sit to Stand: Min guard Stand pivot transfers: Min assist       General transfer comment: verbal cues to back up to chair prior to sitting. PT must provide minA to ensure pt sits on recliner and not on floor as pt does not follow PT commands to continue backing up prior to sitting  Ambulation/Gait Ambulation/Gait assistance:  (pt declines attempts at ambulation)               Stairs             Wheelchair Mobility    Modified Rankin (Stroke Patients Only)       Balance Overall balance assessment: Needs assistance Sitting-balance support: No upper extremity supported;Feet supported Sitting balance-Leahy Scale: Fair     Standing balance support: Bilateral upper extremity supported Standing balance-Leahy Scale: Poor Standing balance comment: reliant on BUE support of RW                            Cognition Arousal/Alertness: Awake/alert Behavior During Therapy: Agitated Overall Cognitive Status: No family/caregiver present to determine baseline cognitive functioning  General Comments: baseline bipolar, pt is quickly agitated. Often utilizes hand gestures and gets upset if therapist does not comprehend the hand gestures accurately      Exercises      General Comments General comments (skin integrity, edema, etc.): VSS on RA. Pt reports dizziness after 2nd sit to stand, BP  stable at 111/84 (93) sitting.      Pertinent Vitals/Pain Pain Assessment: 0-10 Pain Score: 10-Worst pain ever Pain Location: abdomen Pain Descriptors / Indicators: Aching Pain Intervention(s): Monitored during session    Home Living                      Prior Function            PT Goals (current goals can now be found in the care plan section) Acute Rehab PT Goals Patient Stated Goal: to decrease pain Progress towards PT goals: Not progressing toward goals - comment (pt limited by pain)    Frequency    Min 2X/week      PT Plan Current plan remains appropriate    Co-evaluation              AM-PAC PT "6 Clicks" Mobility   Outcome Measure  Help needed turning from your back to your side while in a flat bed without using bedrails?: A Little Help needed moving from lying on your back to sitting on the side of a flat bed without using bedrails?: A Little Help needed moving to and from a bed to a chair (including a wheelchair)?: A Little Help needed standing up from a chair using your arms (e.g., wheelchair or bedside chair)?: A Little Help needed to walk in hospital room?: A Lot Help needed climbing 3-5 steps with a railing? : Total 6 Click Score: 15    End of Session   Activity Tolerance: Patient limited by pain Patient left: in bed;with call bell/phone within reach;with family/visitor present Nurse Communication: Mobility status PT Visit Diagnosis: Unsteadiness on feet (R26.81);Muscle weakness (generalized) (M62.81);Difficulty in walking, not elsewhere classified (R26.2)     Time: 7893-8101 PT Time Calculation (min) (ACUTE ONLY): 46 min  Charges:  $Therapeutic Activity: 38-52 mins                     Arlyss Gandy, PT, DPT Acute Rehabilitation Pager: [phone]    Arlyss Gandy 07/26/2021, 4:24 PM

## 2021-07-26 NOTE — Progress Notes (Signed)
Progress Note  7 Days Post-Op  Subjective: Abdominal pain is stable. Still passing flatus but no BM. No nausea or emesis with NGT in place   Objective: Vital signs in last 24 hours: Temp:  [98.1 F (36.7 C)-100 F (37.8 C)] 98.5 F (36.9 C) (10/12 0748) Pulse Rate:  [95-115] 102 (10/12 0748) Resp:  [16-20] 16 (10/12 0748) BP: (103-126)/(68-77) 109/75 (10/12 0748) SpO2:  [95 %-100 %] 98 % (10/12 0748) Last BM Date: 07/22/21  Intake/Output from previous day: 10/11 0701 - 10/12 0700 In: 6081.5 [I.V.:5532.8; IV Piggyback:548.8] Out: 2875 [Urine:1150; Emesis/NG output:550; Drains:1175] Intake/Output this shift: No intake/output data recorded.  PE: Abd: soft, appropriate diffuse TTP, dressing c/d/I - wound bed with red granulation tissue, BS hypoactive. NG with dark bilious output. Drain upsized with enteric contents present in gravity bag.    Lab Results:  Recent Labs    07/25/21 0307  WBC 18.5*  HGB 9.8*  HCT 30.8*  PLT 616*   BMET Recent Labs    07/24/21 0346 07/25/21 0307  NA 134* 136  K 4.4 4.3  CL 102 105  CO2 25 26  GLUCOSE 124* 122*  BUN 12 13  CREATININE 0.64 0.72  CALCIUM 8.5* 8.2*   PT/INR No results for input(s): LABPROT, INR in the last 72 hours. CMP     Component Value Date/Time   NA 136 07/25/2021 0307   K 4.3 07/25/2021 0307   CL 105 07/25/2021 0307   CO2 26 07/25/2021 0307   GLUCOSE 122 (H) 07/25/2021 0307   BUN 13 07/25/2021 0307   CREATININE 0.72 07/25/2021 0307   CALCIUM 8.2 (L) 07/25/2021 0307   PROT 7.4 07/24/2021 0346   ALBUMIN 1.7 (L) 07/24/2021 0346   AST 18 07/24/2021 0346   ALT 21 07/24/2021 0346   ALKPHOS 101 07/24/2021 0346   BILITOT 0.3 07/24/2021 0346   GFRNONAA >60 07/25/2021 0307   GFRAA >60 09/05/2019 2005   Lipase     Component Value Date/Time   LIPASE 40 07/10/2021 1008       Studies/Results: IR Catheter Tube Change  Result Date: 07/25/2021 INDICATION: 50 year old male with a history of pelvic  abscess, referred for exchange of surgical drain and placement of over the wire drain EXAM: IMAGE GUIDED EXCHANGE OF DRAIN MEDICATIONS: None ANESTHESIA/SEDATION: None COMPLICATIONS: None PROCEDURE: Informed written consent was obtained from the patient after a thorough discussion of the procedural risks, benefits and alternatives. All questions were addressed. Maximal Sterile Barrier Technique was utilized including caps, mask, sterile gowns, sterile gloves, sterile drape, hand hygiene and skin antiseptic. A timeout was performed prior to the initiation of the procedure. Patient positioned supine on the fluoroscopy table. Scout images acquired. 1% lidocaine was used for local anesthesia. Contrast was injected through the indwelling surgical drain confirming location in the pelvis. The catheter was ligated, suture was ligated, and over the wire technique was used to place a 20 [place] Thal. Fall was formed within the pelvis in the region of the abscess. Proximally 200 cc of purulent fluid was vacuum weighted. Fall drain was attached to a Foley bag gravity drain, so there was a more 1-1 match of the internal diameter of the tubing. Dressing was placed. Final image was stored. Patient tolerated the procedure well and remained hemodynamically stable throughout. No complications were encountered and no significant blood loss. IMPRESSION: Image guided exchange of pelvic drain, with removal of surgical drain and placement of a 20 [place] Thal Electronically Signed   By: Marijean Niemann  Loreta Ave D.O.   On: 07/25/2021 10:36    Anti-infectives: Anti-infectives (From admission, onward)    Start     Dose/Rate Route Frequency Ordered Stop   07/18/21 1000  piperacillin-tazobactam (ZOSYN) IVPB 3.375 g        3.375 g 12.5 mL/hr over 240 Minutes Intravenous Every 8 hours 07/18/21 0839     07/12/21 1200  ceFAZolin (ANCEF) IVPB 2g/100 mL premix        2 g 200 mL/hr over 30 Minutes Intravenous To Short Stay 07/12/21 0843 07/12/21 1100         Assessment/Plan  POD#14 - S/P EXPLORATORY LAPAROTOMY; LYSIS OF ADHESIONS; SMALL INTESTINE REPAIR - 07/12/21 Dr. Sheliah Hatch for SBO POD#7 - S/P LAPAROTOMY, REPAIR OF SMALL BOWEL PERFORATION, PLACEMENT 90F BLAKE DRAIN - 07/19/21 Dr Carolynne Edouard - drain upsized by IR yesterday with 1100cc output since exchange.  Continue to gravity - continue NG to LIWS (550 ml)  - PICC/TPN - continue IV abx - OOB, mobilize  - incentive spirometry q 1h - continue PPI for suspected gastritis - WBC 18.5 recheck in am, afebrile   FEN - NPO, NG to LIWS, PICC/TNA 9/30 >> VTE - lovenox ID - ancef x1 dose 9/28 perioperatively, Zosyn 10/4 >> Foley -  no foley, voiding well   Bipolar disorder Homelessness Polysubstance abuse   LOS: 16 days    Letha Cape, [facility] 07/26/2021, 11:22 AM Please see Amion for pager number during day hours 7:00am-4:30pm
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
## 2021-07-27 LAB — CBC
HCT: 29.7 % — ABNORMAL LOW (ref 39.0–52.0)
Hemoglobin: 9.4 g/dL — ABNORMAL LOW (ref 13.0–17.0)
MCH: 28.7 pg (ref 26.0–34.0)
MCHC: 31.6 g/dL (ref 30.0–36.0)
MCV: 90.8 fL (ref 80.0–100.0)
Platelets: 617 10*3/uL — ABNORMAL HIGH (ref 150–400)
RBC: 3.27 MIL/uL — ABNORMAL LOW (ref 4.22–5.81)
RDW: 12.7 % (ref 11.5–15.5)
WBC: 15.2 10*3/uL — ABNORMAL HIGH (ref 4.0–10.5)
nRBC: 0 % (ref 0.0–0.2)

## 2021-07-27 LAB — COMPREHENSIVE METABOLIC PANEL
ALT: 14 U/L (ref 0–44)
AST: 12 U/L — ABNORMAL LOW (ref 15–41)
Albumin: 1.7 g/dL — ABNORMAL LOW (ref 3.5–5.0)
Alkaline Phosphatase: 119 U/L (ref 38–126)
Anion gap: 7 (ref 5–15)
BUN: 12 mg/dL (ref 6–20)
CO2: 26 mmol/L (ref 22–32)
Calcium: 8.3 mg/dL — ABNORMAL LOW (ref 8.9–10.3)
Chloride: 101 mmol/L (ref 98–111)
Creatinine, Ser: 0.63 mg/dL (ref 0.61–1.24)
GFR, Estimated: >60 mL/min (ref >60)
Glucose, Bld: 126 mg/dL — ABNORMAL HIGH (ref 70–99)
Potassium: 3.8 mmol/L (ref 3.5–5.1)
Sodium: 134 mmol/L — ABNORMAL LOW (ref 135–145)
Total Bilirubin: 0.5 mg/dL (ref 0.3–1.2)
Total Protein: 7.2 g/dL (ref 6.5–8.1)

## 2021-07-27 LAB — GLUCOSE, CAPILLARY
Glucose-Capillary: 101 mg/dL — ABNORMAL HIGH (ref 70–99)
Glucose-Capillary: 115 mg/dL — ABNORMAL HIGH (ref 70–99)
Glucose-Capillary: 122 mg/dL — ABNORMAL HIGH (ref 70–99)
Glucose-Capillary: 113 mg/dL — ABNORMAL HIGH (ref 70–99)
Glucose-Capillary: 122 mg/dL — ABNORMAL HIGH (ref 70–99)

## 2021-07-27 LAB — PHOSPHORUS: Phosphorus: 3.6 mg/dL (ref 2.5–4.6)

## 2021-07-27 LAB — MAGNESIUM: Magnesium: 1.9 mg/dL (ref 1.7–2.4)

## 2021-07-27 MED ORDER — SODIUM CHLORIDE 0.9% FLUSH
5.0000 mL | Freq: Three times a day (TID) | INTRAVENOUS | Status: DC
  Administered 2021-07-27 – 2021-08-17 (×48): 5 mL

## 2021-07-27 MED ORDER — ACETAMINOPHEN 10 MG/ML IV SOLN
1000.0000 mg | Freq: Four times a day (QID) | INTRAVENOUS | Status: AC
  Administered 2021-07-27 – 2021-07-28 (×3): 1000 mg via INTRAVENOUS
  Filled 2021-07-27 (×3): qty 100

## 2021-07-27 MED ORDER — TRAVASOL 10 % IV SOLN
INTRAVENOUS | Status: AC
  Filled 2021-07-27: qty 1188
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
## 2021-07-27 NOTE — Progress Notes (Signed)
PHARMACY - TOTAL PARENTERAL NUTRITION CONSULT NOTE  Indication: SBO/Prolonged ileus post-op  Patient Measurements: Height: 5\' 9"  (175.3 cm) Weight: 57.6 kg (126 lb 15.8 oz) IBW/kg (Calculated) : 70.7 TPN AdjBW (KG): 59 Body mass index is 18.75 kg/m. Usual Weight: 140 lbs  Assessment:  50 YOM presented on 07/10/2021 with abdominal pain, nausea, and vomiting for 3 days. PMH significant for polysubstance abuse, homelessness, bipolar disorder, SBO resulting in exlap, LOA and appendectomy at [facility] in May 2020. Patient reported one SBO since that was treated with NGT and improved. Pharmacy consulted to manage TPN for SBO, ileus.  Glucose / Insulin: no hx DM, A1c 5.3%. CBGs well controlled. Off insulin SSI/CBG checks D/C'ed 07/22/21. Electrolytes: Na 134, K 3.8 (goal >/=4), Mg 1.9 (goal >/= 2), Coca 10.1, others WNL  Renal: SCr 0.6, BUN WNL Hepatic: LFTs / tbili / TG WNL, albumin 1.7 Intake / Output; MIVF: UOP 0.7 ml/kg/hr, JP drain 09/21/21 as of 10:30am, NS at 100 ml/hr, LBM 10/8, NG output 800L, net -2L ID: Zosyn (10/4 >>  ) for IAI - now afebrile WBC 18 GI Imaging: - 9/26 xray abd: concerning for SBO - 9/26 CT: high grade pSBO, suspected internal hernia vs. complex adhesions in RLQ - 9/27 xray abd: continued SBO - 9/28 xray abd: multiple gas filled dilated loops similar to CT - 10/4 CT abdomen/pelvis: large dumbbell-shaped pelvic abscess - 10/7 CT - no change pelvic abscess, no evidence of bowel obstruction GI Surgeries / Procedures: - 9/28 exlap, LOA, small intestine repair x2 - 10/4: LLQ drain catheter placement. - 10/5: ex lap with SBR for post-op bowel leak, drain placed -10/11: drain exchange  Central access: PICC placed 07/14/21 TPN start date: 07/14/21  Nutritional Goals:  RD recs increased 10/13 Estimated Needs Total Energy Estimated Needs: 2200-2400 Total Protein Estimated Needs: 110-130 grams Total Fluid Estimated Needs: >/= 2.0 L  Current Nutrition:   TPN  Plan: Per updated RD recommendations, increase TPN to goal rate of 90 mL/hr at 1800 to provide 119g AA, 65 g lLE and 324 g CHO for a total of 2225  kCal, meeting 100% of patient needs Electrolytes in TPN: Increase Na to 150 mEq/L, K 44 mEq/L, Mg 8 mEq/L, Phos 13 mmol/L; Continue Ca 3 mEq/L, Cl:Ac 1:1 Add standard MVI and trace elements to TPN.  NS at 100 ml/hr to keep up with drain output per MD Standard TPN labs Mon and Thurs   11/13, PharmD, BCPS, [facility] Clinical Pharmacist  Please check AMION for all [facility] Pharmacy phone numbers After 10:00 PM, call Main Pharmacy [phone]

## 2021-07-27 NOTE — Progress Notes (Signed)
Progress Note  8 Days Post-Op  Subjective: Abdominal pain remains stable. Still passing flatus but no BM. Mild nausea but no emesis with NGT in place. Has a headache this morning with pain predominately behind his eyes  Objective: Vital signs in last 24 hours: Temp:  [97.7 F (36.5 C)-99.5 F (37.5 C)] 98.4 F (36.9 C) (10/13 0442) Pulse Rate:  [92-108] 94 (10/13 0442) Resp:  [16-17] 16 (10/13 0442) BP: (109-121)/(75-84) 116/82 (10/13 0442) SpO2:  [97 %-100 %] 100 % (10/13 0442) Last BM Date: 07/22/21  Intake/Output from previous day: 10/12 0701 - 10/13 0700 In: 4182.7 [I.V.:3568.5; IV Piggyback:614.2] Out: 1300 [Urine:500; Emesis/NG output:800] Intake/Output this shift: No intake/output data recorded.  PE: General: WD, male who is laying in bed in NAD HEENT: head is normocephalic, atraumatic.  Mouth is pink and moist Heart: regular, rate, and rhythm.  Palpable radial pulses bilaterally Lungs: Respiratory effort nonlabored Abd: soft, appropriate diffuse TTP, dressing c/d/I - wound bed with red granulation tissue, BS hypoactive. NG with dark bilious output. Drain with enteric contents present in gravity bag - 200 mL.   Lab Results:  Recent Labs    07/25/21 0307 07/27/21 0410  WBC 18.5* 15.2*  HGB 9.8* 9.4*  HCT 30.8* 29.7*  PLT 616* 617*    BMET Recent Labs    07/25/21 0307 07/27/21 0410  NA 136 134*  K 4.3 3.8  CL 105 101  CO2 26 26  GLUCOSE 122* 126*  BUN 13 12  CREATININE 0.72 0.63  CALCIUM 8.2* 8.3*    PT/INR No results for input(s): LABPROT, INR in the last 72 hours. CMP     Component Value Date/Time   NA 134 (L) 07/27/2021 0410   K 3.8 07/27/2021 0410   CL 101 07/27/2021 0410   CO2 26 07/27/2021 0410   GLUCOSE 126 (H) 07/27/2021 0410   BUN 12 07/27/2021 0410   CREATININE 0.63 07/27/2021 0410   CALCIUM 8.3 (L) 07/27/2021 0410   PROT 7.2 07/27/2021 0410   ALBUMIN 1.7 (L) 07/27/2021 0410   AST 12 (L) 07/27/2021 0410   ALT 14 07/27/2021  0410   ALKPHOS 119 07/27/2021 0410   BILITOT 0.5 07/27/2021 0410   GFRNONAA >60 07/27/2021 0410   GFRAA >60 09/05/2019 2005   Lipase     Component Value Date/Time   LIPASE 40 07/10/2021 1008       Studies/Results: IR Catheter Tube Change  Result Date: 07/25/2021 INDICATION: 50 year old male with a history of pelvic abscess, referred for exchange of surgical drain and placement of over the wire drain EXAM: IMAGE GUIDED EXCHANGE OF DRAIN MEDICATIONS: None ANESTHESIA/SEDATION: None COMPLICATIONS: None PROCEDURE: Informed written consent was obtained from the patient after a thorough discussion of the procedural risks, benefits and alternatives. All questions were addressed. Maximal Sterile Barrier Technique was utilized including caps, mask, sterile gowns, sterile gloves, sterile drape, hand hygiene and skin antiseptic. A timeout was performed prior to the initiation of the procedure. Patient positioned supine on the fluoroscopy table. Scout images acquired. 1% lidocaine was used for local anesthesia. Contrast was injected through the indwelling surgical drain confirming location in the pelvis. The catheter was ligated, suture was ligated, and over the wire technique was used to place a 20 [place] Thal. Fall was formed within the pelvis in the region of the abscess. Proximally 200 cc of purulent fluid was vacuum weighted. Fall drain was attached to a Foley bag gravity drain, so there was a more 1-1 match of the internal  diameter of the tubing. Dressing was placed. Final image was stored. Patient tolerated the procedure well and remained hemodynamically stable throughout. No complications were encountered and no significant blood loss. IMPRESSION: Image guided exchange of pelvic drain, with removal of surgical drain and placement of a 20 French Thal Electronically Signed   By: Gilmer Mor D.O.   On: 07/25/2021 10:36    Anti-infectives: Anti-infectives (From admission, onward)    Start      Dose/Rate Route Frequency Ordered Stop   07/18/21 1000  piperacillin-tazobactam (ZOSYN) IVPB 3.375 g        3.375 g 12.5 mL/hr over 240 Minutes Intravenous Every 8 hours 07/18/21 0839     07/12/21 1200  ceFAZolin (ANCEF) IVPB 2g/100 mL premix        2 g 200 mL/hr over 30 Minutes Intravenous To Short Stay 07/12/21 0843 07/12/21 1100        Assessment/Plan  POD#15 - S/P EXPLORATORY LAPAROTOMY; LYSIS OF ADHESIONS; SMALL INTESTINE REPAIR - 07/12/21 Dr. Sheliah Hatch for SBO POD#8 - S/P LAPAROTOMY, REPAIR OF SMALL BOWEL PERFORATION, PLACEMENT 57F BLAKE DRAIN - 07/19/21 Dr Carolynne Edouard - drain upsized by IR 10/11 - 1175 charted yesterday, none charted today so far but 200 mL in bag.  Continue to gravity - continue NG to LIWS (800 ml)  - PICC/TPN - continue IV abx - OOB, mobilize  - incentive spirometry q 1h - continue PPI for suspected gastritis - WBC 15.2 recheck in am, afebrile - multimodal pain control - IV tylenol today   FEN - NPO, NG to LIWS, PICC/TNA 9/30 >> VTE - lovenox ID - ancef x1 dose 9/28 perioperatively, Zosyn 10/4 >> Foley -  removed, voiding well   Bipolar disorder Homelessness Polysubstance abuse   LOS: 17 days    Eric Form, [facility] 07/27/2021, 7:45 AM Please see Amion for pager number during day hours 7:00am-4:30pm

## 2021-07-27 NOTE — Progress Notes (Addendum)
Drain Location: suprapubic Size: Fr size: 20  Date of placement: 10/11  Currently to: Drain collection device: gravity 24 hour output:  Output by Drain (mL) 07/25/21 0700 - 07/25/21 1459 07/25/21 1500 - 07/25/21 2259 07/25/21 2300 - 07/26/21 0659 07/26/21 0700 - 07/26/21 1459 07/26/21 1500 - 07/26/21 2259 07/26/21 2300 - 07/27/21 0659 07/27/21 0700 - 07/27/21 1309  Closed System Drain Left LLQ Other (Comment) 20 Fr. 500 525 150        Interval imaging/drain manipulation:  none  Current examination: Flushes/aspirates easily.  WBC down to 15.2.  OP brown and purulent. Insertion site unremarkable. Suture and stat lock in place. Dressed appropriately with minimal amount of yellow-brown tinge around insertion site.   Plan: Continue TID flushes with 5 cc NS. Record output Q shift. Dressing changes QD or PRN if soiled.  Call IR APP or on call IR MD if difficulty flushing or sudden change in drain output.  Repeat imaging/possible drain injection once output < 10 mL/QD (excluding flush material.)  Discharge planning: Please contact IR APP or on call IR MD prior to patient d/c to ensure appropriate follow up plans are in place. Typically patient will follow up with IR clinic 10-14 days post d/c for repeat imaging/possible drain injection. IR scheduler will contact patient with date/time of appointment. Patient will need to flush drain QD with 5 cc NS, record output QD, dressing changes every 2-3 days or earlier if soiled.   IR will continue to follow - please call with questions or concerns.   I spent a total of 15-minute in face to face in clinical consultation, greater than 50% of which was counseling/coordinating care for percutaneous catheter drain for pelvic abscess.

## 2021-07-28 LAB — CBC
HCT: 28.8 % — ABNORMAL LOW (ref 39.0–52.0)
Hemoglobin: 9.4 g/dL — ABNORMAL LOW (ref 13.0–17.0)
MCH: 29.1 pg (ref 26.0–34.0)
MCHC: 32.6 g/dL (ref 30.0–36.0)
MCV: 89.2 fL (ref 80.0–100.0)
Platelets: 632 10*3/uL — ABNORMAL HIGH (ref 150–400)
RBC: 3.23 MIL/uL — ABNORMAL LOW (ref 4.22–5.81)
RDW: 12.5 % (ref 11.5–15.5)
WBC: 12.5 10*3/uL — ABNORMAL HIGH (ref 4.0–10.5)
nRBC: 0 % (ref 0.0–0.2)

## 2021-07-28 LAB — BASIC METABOLIC PANEL
Anion gap: 7 (ref 5–15)
BUN: 11 mg/dL (ref 6–20)
CO2: 25 mmol/L (ref 22–32)
Calcium: 8.1 mg/dL — ABNORMAL LOW (ref 8.9–10.3)
Chloride: 101 mmol/L (ref 98–111)
Creatinine, Ser: 0.58 mg/dL — ABNORMAL LOW (ref 0.61–1.24)
GFR, Estimated: >60 mL/min (ref >60)
Glucose, Bld: 119 mg/dL — ABNORMAL HIGH (ref 70–99)
Potassium: 3.9 mmol/L (ref 3.5–5.1)
Sodium: 133 mmol/L — ABNORMAL LOW (ref 135–145)

## 2021-07-28 LAB — GLUCOSE, CAPILLARY
Glucose-Capillary: 105 mg/dL — ABNORMAL HIGH (ref 70–99)
Glucose-Capillary: 106 mg/dL — ABNORMAL HIGH (ref 70–99)
Glucose-Capillary: 125 mg/dL — ABNORMAL HIGH (ref 70–99)
Glucose-Capillary: 125 mg/dL — ABNORMAL HIGH (ref 70–99)

## 2021-07-28 LAB — PHOSPHORUS: Phosphorus: 3.1 mg/dL (ref 2.5–4.6)

## 2021-07-28 LAB — MAGNESIUM: Magnesium: 1.9 mg/dL (ref 1.7–2.4)

## 2021-07-28 MED ORDER — TRAVASOL 10 % IV SOLN
INTRAVENOUS | Status: AC
  Filled 2021-07-28: qty 1188

## 2021-07-28 MED ORDER — MORPHINE SULFATE (PF) 2 MG/ML IV SOLN
2.0000 mg | INTRAVENOUS | Status: DC | PRN
  Administered 2021-07-28: 2 mg via INTRAVENOUS
  Administered 2021-07-28: 4 mg via INTRAVENOUS
  Administered 2021-07-28 (×2): 2 mg via INTRAVENOUS
  Administered 2021-07-29 (×5): 4 mg via INTRAVENOUS
  Administered 2021-07-29 – 2021-07-30 (×4): 2 mg via INTRAVENOUS
  Administered 2021-07-30 (×2): 4 mg via INTRAVENOUS
  Administered 2021-07-30 (×2): 2 mg via INTRAVENOUS
  Administered 2021-07-30 – 2021-08-01 (×10): 4 mg via INTRAVENOUS
  Administered 2021-08-01 (×3): 2 mg via INTRAVENOUS
  Administered 2021-08-02: 4 mg via INTRAVENOUS
  Administered 2021-08-02 (×3): 2 mg via INTRAVENOUS
  Administered 2021-08-02: 4 mg via INTRAVENOUS
  Administered 2021-08-03 – 2021-08-04 (×13): 2 mg via INTRAVENOUS
  Administered 2021-08-04: 4 mg via INTRAVENOUS
  Administered 2021-08-04: 2 mg via INTRAVENOUS
  Administered 2021-08-05 – 2021-08-06 (×14): 4 mg via INTRAVENOUS
  Administered 2021-08-07: 2 mg via INTRAVENOUS
  Administered 2021-08-07 – 2021-08-08 (×9): 4 mg via INTRAVENOUS
  Filled 2021-07-28 (×3): qty 2
  Filled 2021-07-28 (×3): qty 1
  Filled 2021-07-28: qty 2
  Filled 2021-07-28: qty 1
  Filled 2021-07-28 (×2): qty 2
  Filled 2021-07-28: qty 1
  Filled 2021-07-28: qty 2
  Filled 2021-07-28: qty 1
  Filled 2021-07-28 (×3): qty 2
  Filled 2021-07-28: qty 1
  Filled 2021-07-28: qty 2
  Filled 2021-07-28: qty 1
  Filled 2021-07-28: qty 2
  Filled 2021-07-28: qty 1
  Filled 2021-07-28 (×5): qty 2
  Filled 2021-07-28: qty 1
  Filled 2021-07-28: qty 2
  Filled 2021-07-28: qty 1
  Filled 2021-07-28 (×2): qty 2
  Filled 2021-07-28: qty 1
  Filled 2021-07-28: qty 2
  Filled 2021-07-28: qty 1
  Filled 2021-07-28: qty 2
  Filled 2021-07-28: qty 1
  Filled 2021-07-28: qty 2
  Filled 2021-07-28 (×2): qty 1
  Filled 2021-07-28 (×4): qty 2
  Filled 2021-07-28: qty 1
  Filled 2021-07-28 (×3): qty 2
  Filled 2021-07-28: qty 1
  Filled 2021-07-28: qty 2
  Filled 2021-07-28: qty 1
  Filled 2021-07-28: qty 2
  Filled 2021-07-28: qty 1
  Filled 2021-07-28: qty 2
  Filled 2021-07-28: qty 1
  Filled 2021-07-28: qty 2
  Filled 2021-07-28: qty 1
  Filled 2021-07-28: qty 2
  Filled 2021-07-28: qty 1
  Filled 2021-07-28 (×4): qty 2
  Filled 2021-07-28: qty 1
  Filled 2021-07-28: qty 2
  Filled 2021-07-28: qty 1
  Filled 2021-07-28: qty 2
  Filled 2021-07-28: qty 1
  Filled 2021-07-28 (×2): qty 2
  Filled 2021-07-28: qty 1
  Filled 2021-07-28: qty 2
  Filled 2021-07-28 (×2): qty 1
  Filled 2021-07-28 (×3): qty 2

## 2021-07-28 NOTE — Progress Notes (Signed)
PHARMACY - TOTAL PARENTERAL NUTRITION CONSULT NOTE  Indication: SBO/Prolonged ileus post-op  Patient Measurements: Height: 5\' 9"  (175.3 cm) Weight: 57.6 kg (126 lb 15.8 oz) IBW/kg (Calculated) : 70.7 TPN AdjBW (KG): 59 Body mass index is 18.75 kg/m. Usual Weight: 140 lbs  Assessment:  Thomas [name] presented on 07/10/2021 with abdominal pain, nausea, and vomiting for 3 days. PMH significant for polysubstance abuse, homelessness, bipolar disorder, SBO resulting in exlap, LOA and appendectomy at [facility] in May 2020. Patient reported one SBO since that was treated with NGT and improved. Pharmacy consulted to manage TPN for SBO, ileus.  Glucose / Insulin: no hx DM, A1c 5.3%. CBGs controlled. SSI/CBG checks D/C'd 07/22/21. Electrolytes: Na 133, K 3.9 (goal >/=4), Mg 1.9 (goal >/= 2), others WNL  Renal: SCr WNL stable, BUN WNL Hepatic: LFTs / Tbili / TG WNL, albumin 1.7 Intake / Output; MIVF: UOP 1 ml/kg/hr, JP drain 09/21/21 so far today, NG output 1800L, LBM 10/8; NS at 100 ml/hr, net -3.2L GI Imaging: 9/26 xray abd: concerning for SBO - 9/26 CT: high grade pSBO, suspected internal hernia vs. complex adhesions in RLQ - 9/27 xray abd: continued SBO - 9/28 xray abd: multiple gas filled dilated loops similar to CT - 10/4 CT abdomen/pelvis: large dumbbell-shaped pelvic abscess - 10/7 CT - no change pelvic abscess, no evidence of bowel obstruction GI Surgeries / Procedures: 9/28 exlap, LOA, small intestine repair x2 - 10/4: LLQ drain catheter placement. - 10/5: ex lap with SBR for post-op bowel leak, drain placed -10/11: drain exchange  Central access: PICC placed 07/14/21 TPN start date: 07/14/21  Nutritional Goals:  RD recommendations increased 10/13 Estimated Needs Total Energy Estimated Needs: 2200-2400 Total Protein Estimated Needs: 110-130 grams Total Fluid Estimated Needs: >/= 2.0 L  Current Nutrition:  TPN; NPO  Plan: Continue TPN at goal rate 90 mL/hr at 1800 TPN will provide 119g  AA, 65 g lLE, and 324 g CHO for total 2225 kCal, meeting 100% of patient needs Electrolytes in TPN: Na 150 mEq/L, K 44 mEq/L, Mg 8 mEq/L, Phos 12 mmol/L, Ca 5 mEq/L, Cl:Ac 1:1 Add standard MVI and trace elements to TPN Continue NS at 100 ml/hr to keep up with drain output per MD Monitor standard TPN labs Mon/Thurs F/u Surgery plans   11/13, PharmD, BCPS Please check AMION for all [facility] contact numbers Clinical Pharmacist 07/28/2021 8:16 AM

## 2021-07-28 NOTE — TOC Initial Note (Signed)
Transition of Care [facility]) - Initial/Assessment Note    Patient Details  Name: Thomas [name] MRN: [id number] Date of Birth: 1971-08-05  Transition of Care [facility]) CM/SW Contact:    Thomas Reeve, LCSW Phone Number: 07/28/2021, 1:48 PM  Clinical Narrative:                  CSW met with pt at bedside. Pt reports PTA he was living at home with his niece Thomas [name]. Pt reports he was independent with mobility and ADL's. Pt reports substance use. Pt reports he is no longer using substances as a condition of living with his niece.   CSW explained that due to pt not having health insurance SNF will be unlikely. Pt reports he plans on returning with his niece Thomas [name]. Pt called Thomas [name] in room and Thomas [name] confirmed that pt can return.   Pt is open to [facility] services. CSW explained that there is no guarantee that pt will be able to get [place] services. Pt states he understands.   Pt is not currently medically ready for DC. TOC will continue to follow for DC planning.  Expected Discharge Plan: Skilled Nursing Facility Barriers to Discharge: Continued Medical Work up   Patient Goals and CMS Choice Patient states their goals for this hospitalization and ongoing recovery are:: to get stronger and go home      Expected Discharge Plan and Services Expected Discharge Plan: [place] arrangements for the past 2 months: [place]                                      Prior Living Arrangements/Services Living arrangements for the past 2 months: Single Family Home Lives with:: Relatives Patient language and need for interpreter reviewed:: Yes Do you feel safe going back to the place where you live?: Yes      Need for Family Participation in Patient Care: Yes (Comment) Care giver support system in place?: Yes (comment)   Criminal Activity/Legal Involvement Pertinent to Current Situation/Hospitalization: No - Comment as needed  Activities of Daily  Living Home Assistive Devices/Equipment: None ADL Screening (condition at time of admission) Patient's cognitive ability adequate to safely complete daily activities?: Yes Is the patient deaf or have difficulty hearing?: No Does the patient have difficulty seeing, even when wearing glasses/contacts?: No Does the patient have difficulty concentrating, remembering, or making decisions?: No Patient able to express need for assistance with ADLs?: Yes Does the patient have difficulty dressing or bathing?: No Independently performs ADLs?: Yes (appropriate for developmental age) Does the patient have difficulty walking or climbing stairs?: No Weakness of Legs: None Weakness of Arms/Hands: None  Permission Sought/Granted Permission sought to share information with : Family Supports Permission granted to share information with : Yes, Verbal Permission Granted  Share Information with NAME: Thomas [name] granted to share info w Relationship: Niece  Permission granted to share info w Contact Information: [phone]  Emotional Assessment Appearance:: Appears stated age Attitude/Demeanor/Rapport: Engaged Affect (typically observed): Appropriate Orientation: : Oriented to Self, Oriented to Place, Oriented to  Time, Oriented to Situation Alcohol / Substance Use: Not Applicable Psych Involvement: No (comment)  Admission diagnosis:  Dehydration [E86.0] SBO (small bowel obstruction) (HCC) [K56.609] Non-intractable vomiting with nausea, unspecified vomiting type [R11.2] Patient Active Problem List   Diagnosis Date Noted   Protein-calorie malnutrition, severe  07/15/2021   SBO (small bowel obstruction) ([place]) 07/10/2021   Major depressive disorder, recurrent, severe without psychotic features ([place]) 02/02/2019   MDD (major depressive disorder), recurrent episode ([place]) 02/02/2019   Homicidal ideation    Alcohol abuse with intoxication, uncomplicated ([place]) 98/92/1194   Substance induced mood  disorder ([place]) 03/21/2015   Suicide attempt ([place])    Alcohol abuse 03/12/2012   Cocaine abuse ([place]) 03/12/2012   PCP:  Patient, No Pcp Per (Inactive) Pharmacy:   [place], [place] [address] [place] [address] Phone: [phone] Fax: [phone]  [facility] [address] [place] [address] Phone: [phone] Fax: [phone]     Social Determinants of Health (SDOH) Interventions    Readmission Risk Interventions No flowsheet data found.  Thomas Reeve, LCSW Clinical Social Worker

## 2021-07-28 NOTE — Progress Notes (Signed)
Progress Note  9 Days Post-Op  Subjective: Abdominal pain remains stable. Still with mild headache. Passing flatus. No BM, nausea, emesis.  Objective: Vital signs in last 24 hours: Temp:  [98.1 F (36.7 C)-100.9 F (38.3 C)] 98.4 F (36.9 C) (10/14 0816) Pulse Rate:  [86-102] 86 (10/14 0816) Resp:  [15-18] 17 (10/14 0816) BP: (108-124)/(70-81) 117/81 (10/14 0816) SpO2:  [97 %-100 %] 100 % (10/14 0816) Last BM Date: 07/22/21  Intake/Output from previous day: 10/13 0701 - 10/14 0700 In: 4123.8 [I.V.:3365.1; IV Piggyback:758.7] Out: 3200 [Urine:1400; Emesis/NG output:1800] Intake/Output this shift: Total I/O In: 0  Out: 680 [Urine:300; Emesis/NG output:100; Drains:280]  PE: General: WD, male who is laying in bed in NAD HEENT: head is normocephalic, atraumatic.  Mouth is pink and moist Heart: regular, rate, and rhythm.  Palpable radial pulses bilaterally Lungs: Respiratory effort nonlabored Abd: soft, appropriate diffuse TTP, dressing c/d/I - wound bed with red granulation tissue, BS hypoactive. NG with dark bilious output. Drain with enteric contents present in gravity bag  Lab Results:  Recent Labs    07/27/21 0410 07/28/21 0430  WBC 15.2* 12.5*  HGB 9.4* 9.4*  HCT 29.7* 28.8*  PLT 617* 632*    BMET Recent Labs    07/27/21 0410 07/28/21 0430  NA 134* 133*  K 3.8 3.9  CL 101 101  CO2 26 25  GLUCOSE 126* 119*  BUN 12 11  CREATININE 0.63 0.58*  CALCIUM 8.3* 8.1*    PT/INR No results for input(s): LABPROT, INR in the last 72 hours. CMP     Component Value Date/Time   NA 133 (L) 07/28/2021 0430   K 3.9 07/28/2021 0430   CL 101 07/28/2021 0430   CO2 25 07/28/2021 0430   GLUCOSE 119 (H) 07/28/2021 0430   BUN 11 07/28/2021 0430   CREATININE 0.58 (L) 07/28/2021 0430   CALCIUM 8.1 (L) 07/28/2021 0430   PROT 7.2 07/27/2021 0410   ALBUMIN 1.7 (L) 07/27/2021 0410   AST 12 (L) 07/27/2021 0410   ALT 14 07/27/2021 0410   ALKPHOS 119 07/27/2021 0410    BILITOT 0.5 07/27/2021 0410   GFRNONAA >60 07/28/2021 0430   GFRAA >60 09/05/2019 2005   Lipase     Component Value Date/Time   LIPASE 40 07/10/2021 1008       Studies/Results: No results found.  Anti-infectives: Anti-infectives (From admission, onward)    Start     Dose/Rate Route Frequency Ordered Stop   07/18/21 1000  piperacillin-tazobactam (ZOSYN) IVPB 3.375 g        3.375 g 12.5 mL/hr over 240 Minutes Intravenous Every 8 hours 07/18/21 0839     07/12/21 1200  ceFAZolin (ANCEF) IVPB 2g/100 mL premix        2 g 200 mL/hr over 30 Minutes Intravenous To Short Stay 07/12/21 0843 07/12/21 1100        Assessment/Plan  POD#16 - S/P EXPLORATORY LAPAROTOMY; LYSIS OF ADHESIONS; SMALL INTESTINE REPAIR - 07/12/21 Dr. Sheliah Hatch for SBO POD#9 - S/P LAPAROTOMY, REPAIR OF SMALL BOWEL PERFORATION, PLACEMENT 10F BLAKE DRAIN - 07/19/21 Dr Carolynne Edouard - drain upsized by IR 10/11 - 280 ml so far today.  Continue to gravity - continue NG to LIWS (1800 ml)  - PICC/TPN - continue IV abx - OOB, mobilize  - incentive spirometry q 1h - continue PPI for suspected gastritis - WBC 12.5 - multimodal pain control - IV tylenol, increase interval between IV pain med doses    FEN - NPO, NG to  LIWS, PICC/TNA 9/30 >> VTE - lovenox ID - ancef x1 dose 9/28 perioperatively, Zosyn 10/4 >> Foley -  removed, voiding well   Bipolar disorder Homelessness Polysubstance abuse   LOS: 18 days    Eric Form, [facility] 07/28/2021, 8:34 AM Please see Amion for pager number during day hours 7:00am-4:30pm

## 2021-07-28 NOTE — Progress Notes (Signed)
PT Cancellation Note  Patient Details Name: Thomas [name] MRN: [id number] DOB: March 18, 1971   Cancelled Treatment:    Reason Eval/Treat Not Completed: Patient declined, no reason specified.  Pt refused therapy, reporting he had just "gotten back" and could not do any more today.  Reported to nursing, and spoke with OT about his effort with her. Pt declines to even do bed exercises, and will retry at another time.   Ivar Drape 07/28/2021, 1:59 PM  Samul Dada, PT PhD Acute Rehab Dept. Number: [facility] [id number] and [facility] [phone]

## 2021-07-28 NOTE — Progress Notes (Signed)
Occupational Therapy Treatment Patient Details Name: Thomas [name] MRN: [id number] DOB: 1970-11-28 Today's Date: 07/28/2021   History of present illness 50 y/o male admitted 9/26 secondary to nausea/vomiting, and abdominal pain. Found to have SBO and had NG tube placed. Pt is s/p lysis of adhesions and small intestine repaire on 9/28. Pt with LLQ abscess and had percutaneous drain placement on 10/4. Pt found to have post op bowel leak and is s/p repair of small bowel perforation on 10/5. Drain exchange 07/25/21 PMH includes bipolar disorder, tobacco use and substance abuse.   OT comments  Pt received in bed, pad saturated and pt with decreased awareness. Pt required supervision for bed mobility, minguard for stand-pivot transfer from EOB to recliner. While standing pt utilized the urinal with minguard for safety. Pt continued to appear to have increased agitation intermittently throughout the session when therapist provided assistance, cues, or monitored symptoms. Pt will continue to benefit from skilled OT services to maximize safety and independence with ADL/IADL and functional mobility. Will continue to follow acutely and progress as tolerated.     Recommendations for follow up therapy are one component of a multi-disciplinary discharge planning process, led by the attending physician.  Recommendations may be updated based on patient status, additional functional criteria and insurance authorization.    Follow Up Recommendations  SNF    Equipment Recommendations  None recommended by OT    Recommendations for Other Services PT consult    Precautions / Restrictions Precautions Precautions: Other (comment);Fall Precaution Comments: abdominal drain to foley bag, NG tube Restrictions Weight Bearing Restrictions: No       Mobility Bed Mobility Overal bed mobility: Needs Assistance Bed Mobility: Rolling;Sidelying to Sit;Sit to Supine Rolling: Supervision Sidelying to sit:  Supervision;HOB elevated       General bed mobility comments: significant increased time, heavy reliance on railing    Transfers Overall transfer level: Needs assistance Equipment used: Rolling walker (2 wheeled) Transfers: Sit to/from [employer] Sit to Stand: Min guard Stand pivot transfers: Min assist       General transfer comment: cues for safe use of RW and safe hand placement, pt appeared to have increased agitation with cues and stated "I got this"    Balance Overall balance assessment: Needs assistance Sitting-balance support: No upper extremity supported;Feet supported Sitting balance-Leahy Scale: Fair Sitting balance - Comments: Reliant on UE support   Standing balance support: Bilateral upper extremity supported Standing balance-Leahy Scale: Poor Standing balance comment: reliant on BUE support of RW                           ADL either performed or assessed with clinical judgement   ADL Overall ADL's : Needs assistance/impaired Eating/Feeding: NPO   Grooming: Set up;Bed level               Lower Body Dressing: Minimal assistance;Sit to/from stand   Toilet Transfer: Min Manufacturing systems engineer Details (indicate cue type and reason): from EOB to recliner, pt urinated while standing Toileting- Clothing Manipulation and Hygiene: Min guard;Sit to/from stand       Functional mobility during ADLs: Min guard;Rolling walker General ADL Comments: pt limited to stand-pivot due to pain and declining further mobility     Vision       Perception     Praxis      Cognition Arousal/Alertness: Awake/alert Behavior During Therapy: Agitated Overall Cognitive Status: No family/caregiver present to determine baseline cognitive functioning  General Comments: baseline bipolar, pt is quickly agitated. pt is very soft spoken, when asked to repeat statements, pt very quickly  agitated (grunting and rolling his eyes and raising his voice when repeating himself)        Exercises     Shoulder Instructions       General Comments vss on RA    Pertinent Vitals/ Pain       Pain Assessment: 0-10 Pain Score: 10-Worst pain ever Pain Location: abdomen Pain Descriptors / Indicators: Aching Pain Intervention(s): Monitored during session;Limited activity within patient's tolerance  Home Living                                          Prior Functioning/Environment              Frequency  Min 2X/week        Progress Toward Goals  OT Goals(current goals can now be found in the care plan section)  Progress towards OT goals: Progressing toward goals  Acute Rehab OT Goals Patient Stated Goal: to decrease pain OT Goal Formulation: With patient Time For Goal Achievement: 08/07/21 Potential to Achieve Goals: Good ADL Goals Pt Will Perform Upper Body Dressing: with set-up;with supervision;sitting Pt Will Perform Lower Body Dressing: with min guard assist;sit to/from stand Pt Will Transfer to Toilet: with min guard assist;bedside commode;ambulating Additional ADL Goal #1: Pt will perform bed mobility at Superivison level in preparation for ADLs  Plan Discharge plan remains appropriate    Co-evaluation                 AM-PAC OT "6 Clicks" Daily Activity     Outcome Measure   Help from another person eating meals?: Total Help from another person taking care of personal grooming?: A Little Help from another person toileting, which includes using toliet, bedpan, or urinal?: A Little Help from another person bathing (including washing, rinsing, drying)?: A Little Help from another person to put on and taking off regular upper body clothing?: A Little Help from another person to put on and taking off regular lower body clothing?: A Little 6 Click Score: 16    End of Session Equipment Utilized During Treatment: Rolling  walker  OT Visit Diagnosis: Unsteadiness on feet (R26.81);Other abnormalities of gait and mobility (R26.89);Muscle weakness (generalized) (M62.81);Pain Pain - part of body:  (abdomen)   Activity Tolerance Patient limited by pain   Patient Left in bed;with call bell/phone within reach   Nurse Communication Mobility status        Time: 7408-1448 OT Time Calculation (min): 20 min  Charges: OT General Charges $OT Visit: 1 Visit OT Treatments $Self Care/Home Management : 8-22 mins  Rosey Bath OTR/L Acute Rehabilitation Services Office: [phone]   Rebeca Alert 07/28/2021, 12:49 PM

## 2021-07-29 LAB — GLUCOSE, CAPILLARY
Glucose-Capillary: 110 mg/dL — ABNORMAL HIGH (ref 70–99)
Glucose-Capillary: 110 mg/dL — ABNORMAL HIGH (ref 70–99)
Glucose-Capillary: 99 mg/dL (ref 70–99)
Glucose-Capillary: 117 mg/dL — ABNORMAL HIGH (ref 70–99)

## 2021-07-29 MED ORDER — TRAVASOL 10 % IV SOLN
INTRAVENOUS | Status: AC
  Filled 2021-07-29: qty 1188

## 2021-07-29 NOTE — Progress Notes (Signed)
10 Days Post-Op   Subjective/Chief Complaint: Pt ok Said he got up with staff and that's why he did not do PT  I explained he needs to work with PT next time   No other complaints    Objective: Vital signs in last 24 hours: Temp:  [98.1 F (36.7 C)-100.4 F (38 C)] 98.1 F (36.7 C) (10/15 0847) Pulse Rate:  [95-108] 103 (10/15 0847) Resp:  [16-19] 16 (10/15 0847) BP: (109-122)/(72-82) 109/72 (10/15 0847) SpO2:  [97 %-100 %] 97 % (10/15 0847) Last BM Date: 07/22/21  Intake/Output from previous day: 10/14 0701 - 10/15 0700 In: 0  Out: 4000 [Urine:2200; Emesis/NG output:520; Drains:1280] Intake/Output this shift: No intake/output data recorded.   General: WD, male who is laying in bed in NAD HEENT: head is normocephalic, atraumatic.  Mouth is pink and moist Heart: regular, rate, and rhythm.  Palpable radial pulses bilaterally Lungs: Respiratory effort nonlabored Abd: soft, appropriate diffuse TTP, dressing c/d/I - wound bed  clean with red granulation tissue, BS hypoactive. NG with dark bilious output. Drain with enteric contents present in gravity bag  Lab Results:  Recent Labs    07/27/21 0410 07/28/21 0430  WBC 15.2* 12.5*  HGB 9.4* 9.4*  HCT 29.7* 28.8*  PLT 617* 632*   BMET Recent Labs    07/27/21 0410 07/28/21 0430  NA 134* 133*  K 3.8 3.9  CL 101 101  CO2 26 25  GLUCOSE 126* 119*  BUN 12 11  CREATININE 0.63 0.58*  CALCIUM 8.3* 8.1*   PT/INR No results for input(s): LABPROT, INR in the last 72 hours. ABG No results for input(s): PHART, HCO3 in the last 72 hours.  Invalid input(s): PCO2, PO2  Studies/Results: No results found.  Anti-infectives: Anti-infectives (From admission, onward)    Start     Dose/Rate Route Frequency Ordered Stop   07/18/21 1000  piperacillin-tazobactam (ZOSYN) IVPB 3.375 g        3.375 g 12.5 mL/hr over 240 Minutes Intravenous Every 8 hours 07/18/21 0839     07/12/21 1200  ceFAZolin (ANCEF) IVPB 2g/100 mL premix         2 g 200 mL/hr over 30 Minutes Intravenous To Short Stay 07/12/21 0843 07/12/21 1100       Assessment/Plan: s/p Procedure(s): EXPLORATORY LAPAROTOMY SMALL BOWEL REPAIR OF PERFORATION AND DRAINAGE OF INTERABDOMINAL CYST (N/A) POD#17 - S/P EXPLORATORY LAPAROTOMY; LYSIS OF ADHESIONS; SMALL INTESTINE REPAIR - 07/12/21 Dr. Sheliah Hatch for SBO POD#10 - S/P LAPAROTOMY, REPAIR OF SMALL BOWEL PERFORATION, PLACEMENT 74F BLAKE DRAIN - 07/19/21 Dr Carolynne Edouard - drain upsized by IR 10/11 - 280 ml so far today.  Continue to gravity- continue NG to LIWS 520  ml)  - PICC/TPN - continue IV abx - OOB, mobilize with PT and staff  - incentive spirometry q 1h - continue PPI for suspected gastritis - WBC 12.5  RECHECK TOMORROW  - multimodal pain control - IV tylenol, increase interval between IV pain med doses    FEN - NPO, NG to LIWS, PICC/TNA 9/30 >> VTE - lovenox ID - ancef x1 dose 9/28 perioperatively, Zosyn 10/4 >> Foley -  removed, voiding well   Bipolar disorder Homelessness Polysubstance abuse  Updated  niece Ciarra   LOS: 19 days    Dortha Schwalbe MD  07/29/2021

## 2021-07-29 NOTE — Progress Notes (Signed)
PHARMACY - TOTAL PARENTERAL NUTRITION CONSULT NOTE  Indication: SBO/Prolonged ileus post-op  Patient Measurements: Height: 5\' 9"  (175.3 cm) Weight: 57.6 kg (126 lb 15.8 oz) IBW/kg (Calculated) : 70.7 TPN AdjBW (KG): 59 Body mass index is 18.75 kg/m. Usual Weight: 140 lbs  Assessment:  50 YOM presented on 07/10/2021 with abdominal pain, nausea, and vomiting for 3 days. PMH significant for polysubstance abuse, homelessness, bipolar disorder, SBO resulting in exlap, LOA and appendectomy at [facility] in May 2020. Patient reported one SBO since that was treated with NGT and improved. Pharmacy consulted to manage TPN for SBO, ileus.  Glucose / Insulin: no hx DM, A1c 5.3%. CBGs controlled. SSI/CBG checks D/C'd 07/22/21. Electrolytes: last labs 10/14 - Na 133, K 3.9 (goal >/=4), Mg 1.9 (goal >/= 2), others WNL  Renal: last labs 10/14 - SCr WNL stable, BUN WNL Hepatic: LFTs / Tbili / TG WNL, albumin 1.7 Intake / Output; MIVF: UOP 1.6 ml/kg/hr, drain output 11/14, NG output 820L, LBM 10/8; NS at 100 ml/hr, net -7.1L GI Imaging: 9/26 xray abd: concerning for SBO - 9/26 CT: high grade pSBO, suspected internal hernia vs. complex adhesions in RLQ - 9/27 xray abd: continued SBO - 9/28 xray abd: multiple gas filled dilated loops similar to CT - 10/4 CT abdomen/pelvis: large dumbbell-shaped pelvic abscess - 10/7 CT - no change pelvic abscess, no evidence of bowel obstruction GI Surgeries / Procedures: 9/28 exlap, LOA, small intestine repair x2 - 10/4: LLQ drain catheter placement. - 10/5: ex lap with SBR for post-op bowel leak, drain placed -10/11: drain exchange  Central access: PICC placed 07/14/21 TPN start date: 07/14/21  Nutritional Goals:  RD recommendations increased 10/13 Estimated Needs Total Energy Estimated Needs: 2200-2400 Total Protein Estimated Needs: 110-130 grams Total Fluid Estimated Needs: >/= 2.0 L  Current Nutrition:  TPN; NPO  Plan: Continue TPN at goal rate 90 mL/hr at  1800 TPN will provide 119g AA, 65 g lLE, and 324 g CHO for total 2225 kCal, meeting 100% of patient needs Electrolytes in TPN: Na 150 mEq/L, K 44 mEq/L, Mg 8 mEq/L, Phos 12 mmol/L, Ca 5 mEq/L, Cl:Ac 1:1 Add standard MVI and trace elements to TPN Continue NS at 100 ml/hr to keep up with drain output per MD Monitor standard TPN labs Mon/Thurs F/u Surgery plans   11/13, PharmD, BCPS Please check AMION for all [facility] contact numbers Clinical Pharmacist 07/29/2021 7:41 AM

## 2021-07-29 NOTE — Progress Notes (Signed)
Pt refused lovenox injection. Pt educated on need for injection but still declined.

## 2021-07-29 NOTE — Progress Notes (Signed)
Patient declined walking in hallway.

## 2021-07-30 LAB — BASIC METABOLIC PANEL
Anion gap: 7 (ref 5–15)
BUN: 14 mg/dL (ref 6–20)
CO2: 24 mmol/L (ref 22–32)
Calcium: 8.3 mg/dL — ABNORMAL LOW (ref 8.9–10.3)
Chloride: 103 mmol/L (ref 98–111)
Creatinine, Ser: 0.71 mg/dL (ref 0.61–1.24)
GFR, Estimated: >60 mL/min (ref >60)
Glucose, Bld: 121 mg/dL — ABNORMAL HIGH (ref 70–99)
Potassium: 4 mmol/L (ref 3.5–5.1)
Sodium: 134 mmol/L — ABNORMAL LOW (ref 135–145)

## 2021-07-30 LAB — GLUCOSE, CAPILLARY
Glucose-Capillary: 122 mg/dL — ABNORMAL HIGH (ref 70–99)
Glucose-Capillary: 106 mg/dL — ABNORMAL HIGH (ref 70–99)
Glucose-Capillary: 120 mg/dL — ABNORMAL HIGH (ref 70–99)
Glucose-Capillary: 114 mg/dL — ABNORMAL HIGH (ref 70–99)

## 2021-07-30 LAB — PHOSPHORUS: Phosphorus: 3.6 mg/dL (ref 2.5–4.6)

## 2021-07-30 LAB — MAGNESIUM: Magnesium: 2 mg/dL (ref 1.7–2.4)

## 2021-07-30 MED ORDER — ACETAMINOPHEN 325 MG PO TABS
650.0000 mg | ORAL_TABLET | Freq: Four times a day (QID) | ORAL | Status: DC | PRN
  Administered 2021-07-30 – 2021-07-31 (×2): 650 mg via ORAL
  Filled 2021-07-30 (×2): qty 2

## 2021-07-30 MED ORDER — TRAVASOL 10 % IV SOLN
INTRAVENOUS | Status: AC
  Filled 2021-07-30: qty 1188

## 2021-07-30 NOTE — Progress Notes (Signed)
Pt NG tube has been pulled, he tolerated it well.

## 2021-07-30 NOTE — Progress Notes (Signed)
11 Days Post-Op   Subjective/Chief Complaint: Sleepy but arousable  No new complaints    Objective: Vital signs in last 24 hours: Temp:  [98.4 F (36.9 C)-100.3 F (37.9 C)] 98.9 F (37.2 C) (10/16 0902) Pulse Rate:  [101-110] 107 (10/16 0902) Resp:  [16-22] 16 (10/16 0902) BP: (101-110)/(64-72) 101/64 (10/16 0902) SpO2:  [96 %-100 %] 100 % (10/16 0902) Weight:  [56.5 kg] 56.5 kg (10/16 0644) Last BM Date: 07/22/21  Intake/Output from previous day: 10/15 0701 - 10/16 0700 In: 6621.1 [P.O.:60; I.V.:5748; IV Piggyback:813.2] Out: 3150 [Urine:2200; Drains:950] Intake/Output this shift: No intake/output data recorded.   General: WD, male who is laying in bed in NAD HEENT: head is normocephalic, atraumatic.  Mouth is pink and moist Heart: regular, rate, and rhythm.  Palpable radial pulses bilaterally Lungs: Respiratory effort nonlabored Abd: soft, appropriate diffuse TTP, dressing c/d/I - wound bed  clean ,. NG with minimal  output. Drain with enteric contents present in gravity bag      Lab Results:  Recent Labs    07/28/21 0430  WBC 12.5*  HGB 9.4*  HCT 28.8*  PLT 632*   BMET Recent Labs    07/28/21 0430 07/30/21 0313  NA 133* 134*  K 3.9 4.0  CL 101 103  CO2 25 24  GLUCOSE 119* 121*  BUN 11 14  CREATININE 0.58* 0.71  CALCIUM 8.1* 8.3*   PT/INR No results for input(s): LABPROT, INR in the last 72 hours. ABG No results for input(s): PHART, HCO3 in the last 72 hours.  Invalid input(s): PCO2, PO2  Studies/Results: No results found.  Anti-infectives: Anti-infectives (From admission, onward)    Start     Dose/Rate Route Frequency Ordered Stop   07/18/21 1000  piperacillin-tazobactam (ZOSYN) IVPB 3.375 g        3.375 g 12.5 mL/hr over 240 Minutes Intravenous Every 8 hours 07/18/21 0839     07/12/21 1200  ceFAZolin (ANCEF) IVPB 2g/100 mL premix        2 g 200 mL/hr over 30 Minutes Intravenous To Short Stay 07/12/21 0843 07/12/21 1100        Assessment/Plan: s/p Procedure(s): EXPLORATORY LAPAROTOMY SMALL BOWEL REPAIR OF PERFORATION AND DRAINAGE OF INTERABDOMINAL CYST (N/A)  POD#18 - S/P EXPLORATORY LAPAROTOMY; LYSIS OF ADHESIONS; SMALL INTESTINE REPAIR - 07/12/21 Dr. Sheliah Hatch for SBO POD#11 - S/P LAPAROTOMY, REPAIR OF SMALL BOWEL PERFORATION, PLACEMENT 38F BLAKE DRAIN - 07/19/21 Dr Carolynne Edouard - drain upsized by IR 10/11 - 950 ml yesterday .  Continue to gravity- continue NG to LIWS 520  ml)  - PICC/TPN - continue IV abx - OOB, mobilize with PT and staff  - incentive spirometry q 1h - continue PPI for suspected gastritis  - multimodal pain control - IV tylenol, increase interval between IV pain med doses    FEN - NPO, NG  out since not much coming out  , PICC/TNA 9/30 >> VTE - lovenox ID - ancef x1 dose 9/28 perioperatively, Zosyn 10/4 >> Foley -  removed, voiding well     LOS: 20 days    Dortha Schwalbe MD  07/30/2021

## 2021-07-30 NOTE — Progress Notes (Signed)
PHARMACY - TOTAL PARENTERAL NUTRITION CONSULT NOTE  Indication: SBO/Prolonged ileus post-op  Patient Measurements: Height: 5\' 9"  (175.3 cm) Weight: 56.5 kg (124 lb 9 oz) IBW/kg (Calculated) : 70.7 TPN AdjBW (KG): 59 Body mass index is 18.39 kg/m. Usual Weight: 140 lbs  Assessment:  50 YOM presented on 07/10/2021 with abdominal pain, nausea, and vomiting for 3 days. PMH significant for polysubstance abuse, homelessness, bipolar disorder, SBO resulting in exlap, LOA and appendectomy at [facility] in May 2020. Patient reported one SBO since that was treated with NGT and improved. Pharmacy consulted to manage TPN for SBO, ileus.  Glucose / Insulin: no hx DM, A1c 5.3%. CBGs controlled. SSI/CBG checks D/C'd 07/22/21. Electrolytes: Na 134, others WNL  Renal: SCr WNL stable, BUN WNL Hepatic: LFTs / Tbili / TG WNL, albumin 1.7 Intake / Output; MIVF: UOP 1.6 ml/kg/hr, drain output 09/21/21, no NG output charted, LBM 10/8; NS at 100 ml/hr, net -1.9L GI Imaging: 9/26 xray abd: concerning for SBO - 9/26 CT: high grade pSBO, suspected internal hernia vs. complex adhesions in RLQ - 9/27 xray abd: continued SBO - 9/28 xray abd: multiple gas filled dilated loops similar to CT - 10/4 CT abdomen/pelvis: large dumbbell-shaped pelvic abscess - 10/7 CT - no change pelvic abscess, no evidence of bowel obstruction GI Surgeries / Procedures: 9/28 exlap, LOA, small intestine repair x2 - 10/4: LLQ drain catheter placement. - 10/5: ex lap with SBR for post-op bowel leak, drain placed -10/11: drain exchange  Central access: PICC placed 07/14/21 TPN start date: 07/14/21  Nutritional Goals:  RD recommendations increased 10/13 Estimated Needs Total Energy Estimated Needs: 2200-2400 Total Protein Estimated Needs: 110-130 grams Total Fluid Estimated Needs: >/= 2.0 L  Current Nutrition:  TPN; NPO  Plan: Continue TPN at goal rate 90 ml/hr at 1800. Considering cycling TPN soon if plan is to continue. TPN will  provide 119g AA, 65 g lLE, and 324 g CHO for total 2225 kCal, meeting 100% of patient needs Electrolytes in TPN: Na 150 mEq/L, K 44 mEq/L, Mg 8 mEq/L, Phos 12 mmol/L, Ca 5 mEq/L, Cl:Ac 1:1 Add standard MVI and trace elements to TPN Continue NS at 100 ml/hr to keep up with drain output per MD Monitor standard TPN labs Mon/Thurs F/u Surgery plans   11/13, PharmD, BCPS Please check AMION for all [facility] contact numbers Clinical Pharmacist 07/30/2021 7:41 AM

## 2021-07-31 ENCOUNTER — Inpatient Hospital Stay (HOSPITAL_COMMUNITY): Payer: Self-pay

## 2021-07-31 LAB — MAGNESIUM: Magnesium: 1.9 mg/dL (ref 1.7–2.4)

## 2021-07-31 LAB — COMPREHENSIVE METABOLIC PANEL
ALT: 12 U/L (ref 0–44)
AST: 10 U/L — ABNORMAL LOW (ref 15–41)
Albumin: 1.6 g/dL — ABNORMAL LOW (ref 3.5–5.0)
Alkaline Phosphatase: 128 U/L — ABNORMAL HIGH (ref 38–126)
Anion gap: 7 (ref 5–15)
BUN: 16 mg/dL (ref 6–20)
CO2: 24 mmol/L (ref 22–32)
Calcium: 8.1 mg/dL — ABNORMAL LOW (ref 8.9–10.3)
Chloride: 101 mmol/L (ref 98–111)
Creatinine, Ser: 0.68 mg/dL (ref 0.61–1.24)
GFR, Estimated: >60 mL/min (ref >60)
Glucose, Bld: 122 mg/dL — ABNORMAL HIGH (ref 70–99)
Potassium: 3.9 mmol/L (ref 3.5–5.1)
Sodium: 132 mmol/L — ABNORMAL LOW (ref 135–145)
Total Bilirubin: 0.2 mg/dL — ABNORMAL LOW (ref 0.3–1.2)
Total Protein: 7.5 g/dL (ref 6.5–8.1)

## 2021-07-31 LAB — GLUCOSE, CAPILLARY
Glucose-Capillary: 103 mg/dL — ABNORMAL HIGH (ref 70–99)
Glucose-Capillary: 113 mg/dL — ABNORMAL HIGH (ref 70–99)
Glucose-Capillary: 120 mg/dL — ABNORMAL HIGH (ref 70–99)
Glucose-Capillary: 124 mg/dL — ABNORMAL HIGH (ref 70–99)

## 2021-07-31 LAB — CBC
HCT: 28.5 % — ABNORMAL LOW (ref 39.0–52.0)
Hemoglobin: 9.3 g/dL — ABNORMAL LOW (ref 13.0–17.0)
MCH: 29.2 pg (ref 26.0–34.0)
MCHC: 32.6 g/dL (ref 30.0–36.0)
MCV: 89.6 fL (ref 80.0–100.0)
Platelets: 522 10*3/uL — ABNORMAL HIGH (ref 150–400)
RBC: 3.18 MIL/uL — ABNORMAL LOW (ref 4.22–5.81)
RDW: 12.8 % (ref 11.5–15.5)
WBC: 17.5 10*3/uL — ABNORMAL HIGH (ref 4.0–10.5)
nRBC: 0 % (ref 0.0–0.2)

## 2021-07-31 LAB — PHOSPHORUS: Phosphorus: 3.8 mg/dL (ref 2.5–4.6)

## 2021-07-31 LAB — TRIGLYCERIDES: Triglycerides: 28 mg/dL (ref <150)

## 2021-07-31 MED ORDER — TRAVASOL 10 % IV SOLN
INTRAVENOUS | Status: AC
  Filled 2021-07-31: qty 1188

## 2021-07-31 MED ORDER — ACETAMINOPHEN 10 MG/ML IV SOLN
1000.0000 mg | Freq: Once | INTRAVENOUS | Status: AC
  Administered 2021-08-01: 1000 mg via INTRAVENOUS
  Filled 2021-07-31: qty 100

## 2021-07-31 MED ORDER — IOHEXOL 300 MG/ML  SOLN
100.0000 mL | Freq: Once | INTRAMUSCULAR | Status: AC | PRN
  Administered 2021-07-31: 100 mL via INTRAVENOUS

## 2021-07-31 MED ORDER — IOHEXOL 9 MG/ML PO SOLN
ORAL | Status: AC
  Filled 2021-07-31: qty 1000

## 2021-07-31 NOTE — Progress Notes (Signed)
Progress Note  12 Days Post-Op  Subjective: Febrile to 102.60F overnight. He has no new complaints - continues to have stable abdominal pain. He states he had a BM this am without issues. No respiratory or urinary complaints.  Objective: Vital signs in last 24 hours: Temp:  [98.6 F (37 C)-102.8 F (39.3 C)] 98.6 F (37 C) (10/17 0342) Pulse Rate:  [98-109] 98 (10/17 0342) Resp:  [16-19] 19 (10/17 0342) BP: (94-108)/(61-69) 96/66 (10/17 0342) SpO2:  [95 %-100 %] 100 % (10/17 0342) Weight:  [59.4 kg] 59.4 kg (10/17 0342) Last BM Date: 07/22/21  Intake/Output from previous day: 10/16 0701 - 10/17 0700 In: 3061.1 [P.O.:30; I.V.:2427.7; IV Piggyback:603.4] Out: 3025 [Urine:2050; Drains:975] Intake/Output this shift: No intake/output data recorded.  PE: General: WD, male who is laying in bed in NAD HEENT: head is normocephalic, atraumatic.  Mouth is pink and moist Heart: regular, rate, and rhythm.  Palpable radial pulses bilaterally Lungs: Respiratory effort nonlabored Abd: soft, appropriate diffuse TTP, dressing c/d/I, BS hypoactive. Drain with enteric contents present in gravity bag MSK: no calf edema or TTP bilaterally Skin: warm and dry. No rashes   Lab Results:  Recent Labs    07/31/21 0333  WBC 17.5*  HGB 9.3*  HCT 28.5*  PLT 522*    BMET Recent Labs    07/30/21 0313 07/31/21 0333  NA 134* 132*  K 4.0 3.9  CL 103 101  CO2 24 24  GLUCOSE 121* 122*  BUN 14 16  CREATININE 0.71 0.68  CALCIUM 8.3* 8.1*    PT/INR No results for input(s): LABPROT, INR in the last 72 hours. CMP     Component Value Date/Time   NA 132 (L) 07/31/2021 0333   K 3.9 07/31/2021 0333   CL 101 07/31/2021 0333   CO2 24 07/31/2021 0333   GLUCOSE 122 (H) 07/31/2021 0333   BUN 16 07/31/2021 0333   CREATININE 0.68 07/31/2021 0333   CALCIUM 8.1 (L) 07/31/2021 0333   PROT 7.5 07/31/2021 0333   ALBUMIN 1.6 (L) 07/31/2021 0333   AST 10 (L) 07/31/2021 0333   ALT 12 07/31/2021  0333   ALKPHOS 128 (H) 07/31/2021 0333   BILITOT 0.2 (L) 07/31/2021 0333   GFRNONAA >60 07/31/2021 0333   GFRAA >60 09/05/2019 2005   Lipase     Component Value Date/Time   LIPASE 40 07/10/2021 1008       Studies/Results: No results found.  Anti-infectives: Anti-infectives (From admission, onward)    Start     Dose/Rate Route Frequency Ordered Stop   07/18/21 1000  piperacillin-tazobactam (ZOSYN) IVPB 3.375 g        3.375 g 12.5 mL/hr over 240 Minutes Intravenous Every 8 hours 07/18/21 0839     07/12/21 1200  ceFAZolin (ANCEF) IVPB 2g/100 mL premix        2 g 200 mL/hr over 30 Minutes Intravenous To Short Stay 07/12/21 0843 07/12/21 1100        Assessment/Plan POD#19 - S/P EXPLORATORY LAPAROTOMY; LYSIS OF ADHESIONS; SMALL INTESTINE REPAIR - 07/12/21 Dr. Sheliah Hatch for SBO POD#12 - S/P LAPAROTOMY, REPAIR OF SMALL BOWEL PERFORATION, PLACEMENT 82F BLAKE DRAIN - 07/19/21 Dr Carolynne Edouard - drain upsized by IR 10/11 - 975 ml yesterday .  Continue to gravity - PICC/TPN - continue IV abx - OOB, mobilize with PT and staff  - incentive spirometry q 1h - continue PPI for suspected gastritis - multimodal pain control -NGT out over weekend due to low output  - febrile to  102.57F overnight and WBC 17.5 - last CT 10/7. Consider repeat soon   FEN - NPO, PICC/TNA 9/30 >> VTE - lovenox ID - ancef x1 dose 9/28 perioperatively, Zosyn 10/4 >> Foley -  removed, voiding well   Bipolar disorder Homelessness Polysubstance abuse   LOS: 21 days    Eric Form, [facility] Surgery 07/31/2021, 7:41 AM Please see Amion for pager number during day hours 7:00am-4:30pm

## 2021-07-31 NOTE — Progress Notes (Signed)
Pt off of unit for CT/abdomen. Alert and verbally responsive.

## 2021-07-31 NOTE — Progress Notes (Signed)
Pt back on unit, no distress noted.

## 2021-07-31 NOTE — Progress Notes (Signed)
PT Cancellation Note  Patient Details Name: Thomas [name] MRN: [id number] DOB: 19-Jun-1971   Cancelled Treatment:    Reason Eval/Treat Not Completed: Patient declined, no reason specified Pt refused mobility once in AM and in PM after telling therapist to come back, reports fevers and wanting "to get his body right first." Will follow.   Blake Divine A Larrisa Cravey 07/31/2021, 2:03 PM [place], PT, DPT Acute Rehabilitation Services Pager [phone] Office [phone]

## 2021-07-31 NOTE — Progress Notes (Signed)
PHARMACY - TOTAL PARENTERAL NUTRITION CONSULT NOTE  Indication: SBO/Prolonged ileus post-op  Patient Measurements: Height: 5\' 9"  (175.3 cm) Weight: 59.4 kg (131 lb) IBW/kg (Calculated) : 70.7 TPN AdjBW (KG): 59 Body mass index is 19.35 kg/m. Usual Weight: 140 lbs  Assessment:  50 YOM presented on 07/10/2021 with abdominal pain, nausea, and vomiting for 3 days. PMH significant for polysubstance abuse, homelessness, bipolar disorder, SBO resulting in exlap, LOA and appendectomy at [facility] in May 2020. Patient reported one SBO since that was treated with NGT and improved. Pharmacy consulted to manage TPN for SBO, ileus.  Glucose / Insulin: no hx DM, A1c 5.3%. CBGs well. controlled. SSI and CBGs D/C'd 07/22/21. Electrolytes: Na 132, K 3.9, Mg 1.9  (goal K >=4, Mg >=2) Renal: SCr <1 stable, BUN WNL Hepatic: LFTs / Tbili / TG WNL, albumin 1.6 Intake / Output; MIVF: UOP 1.4 ml/kg/hr, drain output 09/21/21, NG out, LBM 10/8; NS at 100 ml/hr, net -0.8L GI meds: PPI for suspected gastritis GI Imaging: 9/26 xray abd: concerning for SBO - 9/26 CT: high grade pSBO, suspected internal hernia vs. complex adhesions in RLQ - 9/27 xray abd: continued SBO - 9/28 xray abd: multiple gas filled dilated loops similar to CT - 10/4 CT abdomen/pelvis: large dumbbell-shaped pelvic abscess - 10/7 CT - no change pelvic abscess, no evidence of bowel obstruction GI Surgeries / Procedures:  - 9/28 exlap, LOA, small intestine repair x2 - 10/4: LLQ drain catheter placement. - 10/5: ex lap with SBR for post-op bowel leak, drain placed -10/11: drain exchange  Central access: PICC placed 07/14/21 TPN start date: 07/14/21  Nutritional Goals:  RD recommendations increased 10/13 Estimated Needs Total Energy Estimated Needs: 2200-2400 Total Protein Estimated Needs: 110-130 grams Total Fluid Estimated Needs: >/= 2.0 L  Current Nutrition:  TPN; NPO  Plan: Continue TPN at goal rate 90 ml/hr at 1800. TPN will  provide 119g AA, 65 g lLE (29%) , and 324 g CHO (GIR 3.98) for total 2225 kCal, meeting 100% of patient needs Electrolytes in TPN: Na 150 mEq/L, K 44 mEq/L, Mg 8 mEq/L, Phos 12 mmol/L, Ca 5 mEq/L, Cl:Ac 1:1 - Cannot increase K+ or Mg in TPN any further due to concentrations in the bag. Will have to do outside of TPN when needed Add standard MVI and trace elements to TPN Continue NS at 100 ml/hr to keep up with drain output per MD Monitor standard TPN labs Mon/Thurs and PRN   Debby Clyne S. 11/13, PharmD, BCPS Clinical Staff Pharmacist [email or web address] 07/31/2021 7:30 AM

## 2021-07-31 NOTE — Progress Notes (Signed)
   07/30/21 1957  Assess: MEWS Score  Temp (!) 100.7 F (38.2 C)  BP 102/64  Pulse Rate (!) 107  Resp 19  SpO2 100 %  Assess: MEWS Score  MEWS Temp 1  MEWS Systolic 0  MEWS Pulse 1  MEWS RR 0  MEWS LOC 0  MEWS Score 2  MEWS Score Color Yellow  Assess: if the MEWS score is Yellow or Red  Were vital signs taken at a resting state? Yes  Focused Assessment No change from prior assessment  Early Detection of Sepsis Score *See Row Information* Low  MEWS guidelines implemented *See Row Information* Yes  Take Vital Signs  Increase Vital Sign Frequency  Yellow: Q 2hr X 2 then Q 4hr X 2, if remains yellow, continue Q 4hrs  Escalate  MEWS: Escalate Yellow: discuss with charge nurse/RN and consider discussing with provider and RRT  Notify: Charge Nurse/RN  Name of Charge Nurse/RN Notified Carla, RN  Date Charge Nurse/RN Notified 07/30/21  Time Charge Nurse/RN Notified 2000

## 2021-07-31 NOTE — Progress Notes (Signed)
Pt complains of having sweats and still in pain despite just receiving 4mg  of morphine at 0836. Notified [place], Gioia Dei Marsi VS have been retaken and in flowsheet.

## 2021-07-31 NOTE — Plan of Care (Signed)

## 2021-07-31 NOTE — Progress Notes (Signed)
   07/30/21 1957 07/30/21 2141  Assess: MEWS Score  Temp (!) 100.7 F (38.2 C) (!) 102.8 F (39.3 C)  BP 102/64 108/69  Pulse Rate (!) 107 (!) 109  Resp 19 18  SpO2  --  97 %  O2 Device  --  Room Air  Assess: MEWS Score  MEWS Temp 1 2  MEWS Systolic 0 0  MEWS Pulse 1 1  MEWS RR 0 0  MEWS LOC 0 0  MEWS Score 2 3  MEWS Score Color Yellow Yellow  Assess: if the MEWS score is Yellow or Red  Were vital signs taken at a resting state?  --  Yes  Focused Assessment  --  No change from prior assessment  Early Detection of Sepsis Score *See Row Information*  --  Low  Escalate  MEWS: Escalate  --  Yellow: discuss with charge nurse/RN and consider discussing with provider and RRT  Notify: Provider  Provider Name/Title  --  Dr. Feliciana Rossetti  Date Provider Notified  --  07/30/21  Time Provider Notified  --  2151  Notification Type  --  Call  Notification Reason  --  Change in status (Yellow mews)  Provider response  --  See new orders  Date of Provider Response  --  07/30/21  Time of Provider Response  --  2153

## 2021-08-01 ENCOUNTER — Inpatient Hospital Stay (HOSPITAL_COMMUNITY): Payer: Self-pay

## 2021-08-01 LAB — GLUCOSE, CAPILLARY
Glucose-Capillary: 115 mg/dL — ABNORMAL HIGH (ref 70–99)
Glucose-Capillary: 117 mg/dL — ABNORMAL HIGH (ref 70–99)
Glucose-Capillary: 169 mg/dL — ABNORMAL HIGH (ref 70–99)
Glucose-Capillary: 109 mg/dL — ABNORMAL HIGH (ref 70–99)
Glucose-Capillary: 136 mg/dL — ABNORMAL HIGH (ref 70–99)

## 2021-08-01 LAB — CBC
HCT: 27.5 % — ABNORMAL LOW (ref 39.0–52.0)
Hemoglobin: 9 g/dL — ABNORMAL LOW (ref 13.0–17.0)
MCH: 29.1 pg (ref 26.0–34.0)
MCHC: 32.7 g/dL (ref 30.0–36.0)
MCV: 89 fL (ref 80.0–100.0)
Platelets: 512 10*3/uL — ABNORMAL HIGH (ref 150–400)
RBC: 3.09 MIL/uL — ABNORMAL LOW (ref 4.22–5.81)
RDW: 12.8 % (ref 11.5–15.5)
WBC: 16.9 10*3/uL — ABNORMAL HIGH (ref 4.0–10.5)
nRBC: 0 % (ref 0.0–0.2)

## 2021-08-01 MED ORDER — TRAVASOL 10 % IV SOLN
INTRAVENOUS | Status: AC
  Filled 2021-08-01: qty 1188

## 2021-08-01 MED ORDER — FENTANYL CITRATE (PF) 100 MCG/2ML IJ SOLN
INTRAMUSCULAR | Status: DC | PRN
  Administered 2021-08-01: 50 ug via INTRAVENOUS

## 2021-08-01 MED ORDER — OCTREOTIDE ACETATE 100 MCG/ML IJ SOLN
100.0000 ug | Freq: Every day | INTRAMUSCULAR | Status: DC
  Administered 2021-08-01 – 2021-08-02 (×2): 100 ug via SUBCUTANEOUS
  Filled 2021-08-01 (×13): qty 1

## 2021-08-01 MED ORDER — MIDAZOLAM HCL 2 MG/2ML IJ SOLN
INTRAMUSCULAR | Status: AC
  Filled 2021-08-01: qty 4

## 2021-08-01 MED ORDER — MIDAZOLAM HCL 2 MG/2ML IJ SOLN
INTRAMUSCULAR | Status: DC | PRN
  Administered 2021-08-01: 1 mg via INTRAVENOUS

## 2021-08-01 MED ORDER — FENTANYL CITRATE (PF) 100 MCG/2ML IJ SOLN
INTRAMUSCULAR | Status: AC
  Filled 2021-08-01: qty 4

## 2021-08-01 MED ORDER — ACETAMINOPHEN 10 MG/ML IV SOLN
1000.0000 mg | Freq: Four times a day (QID) | INTRAVENOUS | Status: AC
  Administered 2021-08-01 – 2021-08-02 (×4): 1000 mg via INTRAVENOUS
  Filled 2021-08-01 (×4): qty 100

## 2021-08-01 MED ORDER — ENOXAPARIN SODIUM 40 MG/0.4ML IJ SOSY
40.0000 mg | PREFILLED_SYRINGE | INTRAMUSCULAR | Status: DC
  Administered 2021-08-03 – 2021-08-15 (×10): 40 mg via SUBCUTANEOUS
  Filled 2021-08-01 (×15): qty 0.4

## 2021-08-01 NOTE — Progress Notes (Signed)
   07/31/21 2343  Assess: MEWS Score  Temp (!) 103 F (39.4 C)  BP 106/65  Pulse Rate (!) 117  Resp 15  SpO2 97 %  O2 Device Room Air  Assess: MEWS Score  MEWS Temp 2  MEWS Systolic 0  MEWS Pulse 2  MEWS RR 0  MEWS LOC 0  MEWS Score 4  MEWS Score Color Red  Assess: if the MEWS score is Yellow or Red  Were vital signs taken at a resting state? Yes  Focused Assessment Change from prior assessment (see assessment flowsheet)  Early Detection of Sepsis Score *See Row Information* High  MEWS guidelines implemented *See Row Information* Yes  Treat  MEWS Interventions Other (Comment) (contacted MD)  Pain Scale 0-10 (refused to rate verbally)  Faces Pain Scale 8  Pain Type Surgical pain;Acute pain  Pain Location Abdomen  Pain Orientation Lateral;Mid  Pain Descriptors / Indicators Moaning  Pain Frequency Constant  Pain Onset On-going  Patients Stated Pain Goal 3  Pain Intervention(s) Medication (See eMAR)  Breathing 0  Negative Vocalization 1  Facial Expression 2  Body Language 1  Consolability 1  PAINAD Score 5  Facial Expression 1  Body Movements 1  Muscle Tension 1  Complains of Agitation (pain)  Neuro symptoms relieved by Rest  Take Vital Signs  Increase Vital Sign Frequency  Red: Q 1hr X 4 then Q 4hr X 4, if remains red, continue Q 4hrs  Escalate  MEWS: Escalate Red: discuss with charge nurse/RN and provider, consider discussing with RRT  Notify: Charge Nurse/RN  Name of Charge Nurse/RN Notified [place],RN  Date Charge Nurse/RN Notified 08/01/21  Time Charge Nurse/RN Notified 2345

## 2021-08-01 NOTE — Progress Notes (Signed)
OT Cancellation Note  Patient Details Name: GEORGIO HATTABAUGH MRN: [id number] DOB: 1970/11/11   Cancelled Treatment:    Reason Eval/Treat Not Completed: Patient at procedure or test/ unavailable. Pt currently having drain placed. Will follow up as able.  Hillard Danker, OT/L  Acute Rehab [phone]  Lenice Llamas 08/01/2021, 4:01 PM

## 2021-08-01 NOTE — Progress Notes (Signed)
   07/31/21 2347  Notify: Provider  Provider Name/Title Dr. Armando Ramirez  Date Provider Notified 07/31/21  Time Provider Notified 2346  Notification Type Call  Notification Reason Change in status  Provider response See new orders  Date of Provider Response 07/31/21  Time of Provider Response 2346  Document  Patient Outcome Not stable and remains on department  Progress note created (see row info) Yes   

## 2021-08-01 NOTE — Progress Notes (Signed)
Attempted to call and give update to patient's niece [place] at his request. No answer and voicemail full. Will try again later
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
## 2021-08-01 NOTE — Progress Notes (Addendum)
Progress Note  13 Days Post-Op  Subjective: Continues to have fevers. No new complaints. Passing flatus. Abdominal pain stable. No nausea or emesis  Objective: Vital signs in last 24 hours: Temp:  [97.7 F (36.5 C)-103 F (39.4 C)] 98 F (36.7 C) (10/18 0408) Pulse Rate:  [89-117] 90 (10/18 0408) Resp:  [14-18] 15 (10/18 0408) BP: (93-109)/(60-71) 100/71 (10/18 0408) SpO2:  [95 %-99 %] 99 % (10/18 0408) Weight:  [55.4 kg] 55.4 kg (10/18 0422) Last BM Date: 07/22/21  Intake/Output from previous day: 10/17 0701 - 10/18 0700 In: 2892.8 [P.O.:125; I.V.:2406.4; IV Piggyback:361.5] Out: 2275 [Urine:1550; Drains:725] Intake/Output this shift: No intake/output data recorded.  PE: General: WD, male who is laying in bed in NAD HEENT: head is normocephalic, atraumatic.  Mouth is pink and moist Heart: regular, rate, and rhythm.  Palpable radial pulses bilaterally Lungs: Respiratory effort nonlabored Abd: soft, appropriate diffuse TTP, midline dressing c/d/I, BS hypoactive. Drain with scant enteric contents present in gravity bag MSK: no calf edema or TTP bilaterally Skin: warm and dry. No rashes   Lab Results:  Recent Labs    07/31/21 0333 08/01/21 0432  WBC 17.5* 16.9*  HGB 9.3* 9.0*  HCT 28.5* 27.5*  PLT 522* 512*    BMET Recent Labs    07/30/21 0313 07/31/21 0333  NA 134* 132*  K 4.0 3.9  CL 103 101  CO2 24 24  GLUCOSE 121* 122*  BUN 14 16  CREATININE 0.71 0.68  CALCIUM 8.3* 8.1*    PT/INR No results for input(s): LABPROT, INR in the last 72 hours. CMP     Component Value Date/Time   NA 132 (L) 07/31/2021 0333   K 3.9 07/31/2021 0333   CL 101 07/31/2021 0333   CO2 24 07/31/2021 0333   GLUCOSE 122 (H) 07/31/2021 0333   BUN 16 07/31/2021 0333   CREATININE 0.68 07/31/2021 0333   CALCIUM 8.1 (L) 07/31/2021 0333   PROT 7.5 07/31/2021 0333   ALBUMIN 1.6 (L) 07/31/2021 0333   AST 10 (L) 07/31/2021 0333   ALT 12 07/31/2021 0333   ALKPHOS 128 (H)  07/31/2021 0333   BILITOT 0.2 (L) 07/31/2021 0333   GFRNONAA >60 07/31/2021 0333   GFRAA >60 09/05/2019 2005   Lipase     Component Value Date/Time   LIPASE 40 07/10/2021 1008       Studies/Results: CT ABDOMEN PELVIS W CONTRAST  Result Date: 07/31/2021 CLINICAL DATA:  History of prior small bowel perforation with resection and repair, subsequent encounter EXAM: CT ABDOMEN AND PELVIS WITH CONTRAST TECHNIQUE: Multidetector CT imaging of the abdomen and pelvis was performed using the standard protocol following bolus administration of intravenous contrast. CONTRAST:  OMNIPAQUE IOHEXOL 300 MG/ML  SOLN COMPARISON:  07/21/2021 FINDINGS: Lower chest: Bilateral lower lobe consolidation is noted right greater than left relatively stable from the prior exam. Hepatobiliary: No focal liver abnormality is seen. No gallstones, gallbladder wall thickening, or biliary dilatation. A small focus of air is noted anterior to the liver best seen on image number 14 of series 3 likely related to the prior laparotomy. No other foci of free air are seen. Pancreas: Unremarkable. No pancreatic ductal dilatation or surrounding inflammatory changes. Spleen: Normal in size without focal abnormality. Adrenals/Urinary Tract: Adrenal glands are within normal limits. Kidneys demonstrate a normal enhancement pattern with normal excretion of contrast. The bladder is decompressed. A small amount of air is noted within the bladder likely related to prior instrumentation. Stomach/Bowel: Colon shows fecal material  and contrast scattered throughout. No obstructive or inflammatory changes are seen. The appendix is not well visualized. Some radiopaque density is noted adjacent to the cecum which may be related to prior appendectomy. Correlation with the clinical findings is recommended. The stomach is well visualized and within normal limits. Scattered contrast is noted throughout the small bowel. In the pelvis there is a loop of  small bowel which demonstrates free communication with the previously seen intra-abdominal abscess. Previously noted surgically placed drain is again seen lying within the previously noted abscess with contrast material identified within the abscess as well from the adjacent small bowel. Vascular/Lymphatic: Aortic atherosclerosis. No enlarged abdominal or pelvic lymph nodes. Reproductive: Prostate is unremarkable. Other: Previously seen bilobed pelvic abscess has decompressed significantly following the previously placed surgical drain although there remains a collection which measures approximately 7.2 x 5.6 cm in greatest dimension. Contrast is noted within from leakage from the adjacent small bowel loop. These changes are best noted on image number 42 of series 6 as well as image number 73 of series 3. There is a new bilobed fluid collection identified in the right upper quadrant compressing the adjacent bowel structures as well as the gallbladder. The dominant component measures approximately 9.5 by 8.8 cm in greatest AP and transverse dimensions. An air-fluid level is noted as well as enhancement of the wall. The smaller more posterior collection measures approximately 4.6 by 2.4 cm. Again these changes are new from the prior exam and consistent with postoperative abscess. This would likely be amenable to percutaneous drainage given its approximation with the anterior abdominal wall laterally. Musculoskeletal: No acute or significant osseous findings. IMPRESSION: Postoperative changes are noted with a percutaneous drain extending into a large pelvic abscess which has significantly reduced in size when compared with the prior exam however demonstrates poorly administered contrast within and findings of a communication between and adjacent small bowel loop and the abscess cavity deep within the pelvis as described. New bilobed air-fluid collection in the right upper quadrant consistent with postoperative abscess  as described. Bilateral lower lobe consolidation right greater than left stable from the prior study. Minimal free air consistent with the prior laparotomy. These results will be called to the ordering clinician or representative by the Radiologist Assistant, and communication documented in the PACS or [employer]. Electronically Signed   By: Alcide Clever M.D.   On: 07/31/2021 20:34    Anti-infectives: Anti-infectives (From admission, onward)    Start     Dose/Rate Route Frequency Ordered Stop   07/18/21 1000  piperacillin-tazobactam (ZOSYN) IVPB 3.375 g        3.375 g 12.5 mL/hr over 240 Minutes Intravenous Every 8 hours 07/18/21 0839     07/12/21 1200  ceFAZolin (ANCEF) IVPB 2g/100 mL premix        2 g 200 mL/hr over 30 Minutes Intravenous To Short Stay 07/12/21 0843 07/12/21 1100        Assessment/Plan POD#20 - S/P EXPLORATORY LAPAROTOMY; LYSIS OF ADHESIONS; SMALL INTESTINE REPAIR - 07/12/21 Dr. Sheliah Hatch for SBO POD#13 - S/P LAPAROTOMY, REPAIR OF SMALL BOWEL PERFORATION, PLACEMENT 18F BLAKE DRAIN - 07/19/21 Dr Carolynne Edouard - high output fistula - start octreotide - pelvic abscess drain upsized by IR 10/11 - 725 ml yesterday .  Continue to gravity - PICC/TPN. NGT out - continue IV abx - OOB, mobilize with PT and staff  - incentive spirometry q 1h - continue PPI for suspected gastritis - multimodal pain control - added IV tylenol today  -  CT 10/17: pelvic abscess reduced. Continued fistula. New abscess in RUQ - consult to IR for drain  Will call and update his niece [place] per his request   FEN - NPO, PICC/TNA 9/30 >> VTE - lovenox (has been refusing) ID - ancef x1 dose 9/28 perioperatively, Zosyn 10/4 >> Foley -  removed, voiding well   Bipolar disorder Homelessness Polysubstance abuse   LOS: 22 days    Eric Form, [facility] Surgery 08/01/2021, 7:31 AM Please see [employer]ion for pager number during day hours 7:00am-4:30pm

## 2021-08-01 NOTE — Progress Notes (Signed)
Pt refused ambulating in hallway this morning. Will try again this afternoon.

## 2021-08-01 NOTE — Progress Notes (Deleted)
   07/31/21 2347  Notify: Provider  Provider Name/Title Dr. Axel Filler  Date Provider Notified 07/31/21  Time Provider Notified 2346  Notification Type Call  Notification Reason Change in status  Provider response See new orders  Date of Provider Response 07/31/21  Time of Provider Response 2346  Document  Patient Outcome Not stable and remains on department  Progress note created (see row info) Yes

## 2021-08-01 NOTE — Progress Notes (Signed)
PT Cancellation Note  Patient Details Name: Thomas [name] MRN: [id number] DOB: 24-Mar-1971   Cancelled Treatment:    Reason Eval/Treat Not Completed: Other (comment);Patient declined, no reason specified Pt reports he wants to die when PT entered room. Pt has continually refused therapy for >3 days/times consecutively so will sign off for now. Pt agrees. Re-consult if pt ready to participate in therapy/mobility. Pt with high fevers today and planning to go to get another drain due to intra-abdominal abscess. Signing off, thanks.   Blake Divine A Shagun Wordell 08/01/2021, 12:56 PM [place], PT, DPT Acute Rehabilitation Services Pager [phone] Office [phone]

## 2021-08-01 NOTE — Procedures (Signed)
Interventional Radiology Procedure:   Indications: Large abscess in right abdomen  Procedure: CT guided drain placement in right abdominal abscess.  Findings: 10 Fr drain placed and removed 330 ml of brown foul smelling fluid.  Complications: No immediate complications noted.     EBL: Minimal  Plan: Sending fluid for culture and follow output.     Pharell Rolfson R. Lowella Dandy, MD  Pager: [phone]

## 2021-08-01 NOTE — Progress Notes (Signed)
Pt refused ambulating in hallway this afternoon. States he does not feel like getting up.

## 2021-08-02 LAB — GLUCOSE, CAPILLARY
Glucose-Capillary: 124 mg/dL — ABNORMAL HIGH (ref 70–99)
Glucose-Capillary: 128 mg/dL — ABNORMAL HIGH (ref 70–99)
Glucose-Capillary: 192 mg/dL — ABNORMAL HIGH (ref 70–99)
Glucose-Capillary: 147 mg/dL — ABNORMAL HIGH (ref 70–99)

## 2021-08-02 MED ORDER — ACETAMINOPHEN 10 MG/ML IV SOLN
1000.0000 mg | Freq: Four times a day (QID) | INTRAVENOUS | Status: AC
  Administered 2021-08-02 – 2021-08-03 (×4): 1000 mg via INTRAVENOUS
  Filled 2021-08-02 (×4): qty 100

## 2021-08-02 MED ORDER — TRAVASOL 10 % IV SOLN
INTRAVENOUS | Status: AC
  Filled 2021-08-02: qty 1188

## 2021-08-02 NOTE — H&P (Signed)
Referring Physician(s): * No referring provider recorded for this case *  Supervising Physician: Pernell Dupre  Patient Status:  [facility] - In-pt  Chief Complaint:  Right abdominal abscess drain placed 08/01/2021; follow up  Subjective:  Patient resting quietly in bed.  Patient endorses pain and mild tenderness to insertion site.  He denies fevers, chills, chest pain, shortness of breath.   Allergies: Patient has no known allergies.  Medications: Prior to Admission medications   Medication Sig Start Date End Date Taking? Authorizing Provider  benztropine (COGENTIN) 1 MG tablet Take 1 tablet (1 mg total) by mouth 2 (two) times daily. Patient not taking: Reported on 07/11/2021 02/05/19   Malvin Johns, MD  divalproex (DEPAKOTE ER) 500 MG 24 hr tablet Take 1 tablet (500 mg total) by mouth 2 (two) times daily. Patient not taking: Reported on 07/11/2021 02/05/19   Malvin Johns, MD  fluPHENAZine (PROLIXIN) 5 MG tablet Take 1 tablet (5 mg total) by mouth 3 (three) times daily. Patient not taking: Reported on 07/11/2021 02/05/19   Malvin Johns, MD  gabapentin (NEURONTIN) 300 MG capsule Take 1 capsule (300 mg total) by mouth 3 (three) times daily. Patient not taking: Reported on 07/11/2021 02/05/19   Malvin Johns, MD  lidocaine (LIDODERM) 5 % Place 1 patch onto the skin daily. Remove & Discard patch within 12 hours or as directed by MD Patient not taking: Reported on 07/11/2021 05/04/19   Caccavale, Sophia, PA-C  naproxen (NAPROSYN) 500 MG tablet Take 1 tablet (500 mg total) by mouth 2 (two) times daily with a meal. Patient not taking: Reported on 07/11/2021 05/04/19   Caccavale, Sophia, PA-C     Vital Signs: BP 112/67 (BP Location: Left Arm)   Pulse 92   Temp 99.8 F (37.7 C) (Oral)   Resp 19   Ht 5\' 9"  (1.753 m)   Wt 122 lb 2.2 oz (55.4 kg)   SpO2 99%   BMI 18.04 kg/m   Physical Exam Vitals reviewed.  Constitutional:      Appearance: He is ill-appearing.  HENT:     Head:  Normocephalic and atraumatic.  Cardiovascular:     Rate and Rhythm: Normal rate.  Pulmonary:     Effort: Pulmonary effort is normal.  Abdominal:     General: Abdomen is flat. There is no distension.     Palpations: Abdomen is soft.     Tenderness: There is abdominal tenderness. There is no guarding.     Comments: RUQ drain in place.  Insertion site unremarkable with sutures and StatLock in place.  There is no redness, drainage, bleeding or other signs and symptoms of infection.  Dressing is soiled with small amount of reddish drainage.  Skin:    General: Skin is warm and dry.  Neurological:     Mental Status: He is alert and oriented to person, place, and time.  Psychiatric:        Mood and Affect: Mood normal.        Behavior: Behavior normal.        Thought Content: Thought content normal.        Judgment: Judgment normal.    Imaging: CT ABDOMEN PELVIS W CONTRAST  Result Date: 07/31/2021 CLINICAL DATA:  History of prior small bowel perforation with resection and repair, subsequent encounter EXAM: CT ABDOMEN AND PELVIS WITH CONTRAST TECHNIQUE: Multidetector CT imaging of the abdomen and pelvis was performed using the standard protocol following bolus administration of intravenous contrast. CONTRAST:  08/02/2021 OMNIPAQUE IOHEXOL  300 MG/ML  SOLN COMPARISON:  07/21/2021 FINDINGS: Lower chest: Bilateral lower lobe consolidation is noted right greater than left relatively stable from the prior exam. Hepatobiliary: No focal liver abnormality is seen. No gallstones, gallbladder wall thickening, or biliary dilatation. A small focus of air is noted anterior to the liver best seen on image number 14 of series 3 likely related to the prior laparotomy. No other foci of free air are seen. Pancreas: Unremarkable. No pancreatic ductal dilatation or surrounding inflammatory changes. Spleen: Normal in size without focal abnormality. Adrenals/Urinary Tract: Adrenal glands are within normal limits. Kidneys  demonstrate a normal enhancement pattern with normal excretion of contrast. The bladder is decompressed. A small amount of air is noted within the bladder likely related to prior instrumentation. Stomach/Bowel: Colon shows fecal material and contrast scattered throughout. No obstructive or inflammatory changes are seen. The appendix is not well visualized. Some radiopaque density is noted adjacent to the cecum which may be related to prior appendectomy. Correlation with the clinical findings is recommended. The stomach is well visualized and within normal limits. Scattered contrast is noted throughout the small bowel. In the pelvis there is a loop of small bowel which demonstrates free communication with the previously seen intra-abdominal abscess. Previously noted surgically placed drain is again seen lying within the previously noted abscess with contrast material identified within the abscess as well from the adjacent small bowel. Vascular/Lymphatic: Aortic atherosclerosis. No enlarged abdominal or pelvic lymph nodes. Reproductive: Prostate is unremarkable. Other: Previously seen bilobed pelvic abscess has decompressed significantly following the previously placed surgical drain although there remains a collection which measures approximately 7.2 x 5.6 cm in greatest dimension. Contrast is noted within from leakage from the adjacent small bowel loop. These changes are best noted on image number 42 of series 6 as well as image number 73 of series 3. There is a new bilobed fluid collection identified in the right upper quadrant compressing the adjacent bowel structures as well as the gallbladder. The dominant component measures approximately 9.5 by 8.8 cm in greatest AP and transverse dimensions. An air-fluid level is noted as well as enhancement of the wall. The smaller more posterior collection measures approximately 4.6 by 2.4 cm. Again these changes are new from the prior exam and consistent with postoperative  abscess. This would likely be amenable to percutaneous drainage given its approximation with the anterior abdominal wall laterally. Musculoskeletal: No acute or significant osseous findings. IMPRESSION: Postoperative changes are noted with a percutaneous drain extending into a large pelvic abscess which has significantly reduced in size when compared with the prior exam however demonstrates poorly administered contrast within and findings of a communication between and adjacent small bowel loop and the abscess cavity deep within the pelvis as described. New bilobed air-fluid collection in the right upper quadrant consistent with postoperative abscess as described. Bilateral lower lobe consolidation right greater than left stable from the prior study. Minimal free air consistent with the prior laparotomy. These results will be called to the ordering clinician or representative by the Radiologist Assistant, and communication documented in the PACS or [employer]. Electronically Signed   By: Alcide Clever M.D.   On: 07/31/2021 20:34   CT IMAGE GUIDED DRAINAGE BY PERCUTANEOUS CATHETER  Result Date: 08/01/2021 INDICATION: 50 year old with postoperative fluid collection in the right abdomen and concerning for a large abscess. EXAM: CT-GUIDED DRAINAGE OF RIGHT ABDOMINAL FLUID COLLECTION MEDICATIONS: Moderate sedation ANESTHESIA/SEDATION: Moderate (conscious) sedation was employed during this procedure. A total of Versed 2.0mg   and fentanyl 50 mcg was administered intravenously at the order of the provider performing the procedure. Total intra-service moderate sedation time: 22 minutes. Patient's level of consciousness and vital signs were monitored continuously by radiology nurse throughout the procedure under the supervision of the provider performing the procedure. COMPLICATIONS: None immediate. PROCEDURE: Informed written consent was obtained from the patient after a thorough discussion of the procedural risks,  benefits and alternatives. All questions were addressed. A timeout was performed prior to the initiation of the procedure. Patient was placed supine on the CT scanner. Images through the abdomen were obtained. The large air-fluid collection in the right abdomen was identified and targeted. Right side of the abdomen was prepped and draped in sterile fashion. Maximal barrier sterile technique was utilized including caps, mask, sterile gowns, sterile gloves, sterile drape, hand hygiene and skin antiseptic. Skin was anesthetized with 1% lidocaine. A small incision was made. Using CT guidance, 18 gauge trocar needle was directed into the large right abdominal fluid collection and foul-smelling brown fluid was aspirated. Superstiff Amplatz wire was advanced into the collection. The tract was dilated to accommodate a 10 [place] multipurpose drain. 330 mL of brown foul-smelling fluid was removed. Follow up CT images were obtained. Drain was secured to skin with suture. Drain was attached to a suction bulb. FINDINGS: Large air-fluid collection in the right abdomen. Collection was decompressed at the end of the procedure. IMPRESSION: CT-guided placement of a drainage catheter within the large right abdominal fluid collection. Electronically Signed   By: Richarda Overlie M.D.   On: 08/01/2021 18:15    Labs:  CBC: Recent Labs    07/27/21 0410 07/28/21 0430 07/31/21 0333 08/01/21 0432  WBC 15.2* 12.5* 17.5* 16.9*  HGB 9.4* 9.4* 9.3* 9.0*  HCT 29.7* 28.8* 28.5* 27.5*  PLT 617* 632* 522* 512*    COAGS: Recent Labs    07/18/21 0823  INR 1.1    BMP: Recent Labs    07/27/21 0410 07/28/21 0430 07/30/21 0313 07/31/21 0333  NA 134* 133* 134* 132*  K 3.8 3.9 4.0 3.9  CL 101 101 103 101  CO2 26 25 24 24   GLUCOSE 126* 119* 121* 122*  BUN 12 11 14 16   CALCIUM 8.3* 8.1* 8.3* 8.1*  CREATININE 0.63 0.58* 0.71 0.68  GFRNONAA >60 >60 >60 >60    LIVER FUNCTION TESTS: Recent Labs    07/20/21 0406  07/24/21 0346 07/27/21 0410 07/31/21 0333  BILITOT 0.4 0.3 0.5 0.2*  AST 18 18 12* 10*  ALT 13 21 14 12   ALKPHOS 67 101 119 128*  PROT 6.0* 7.4 7.2 7.5  ALBUMIN 1.6* 1.7* 1.7* 1.6*    Assessment and Plan: Patient resting quietly in bed.  He is cachectic and ill-appearing. He is alert and oriented x4.  Patient is calm and cooperative.  Patient is receiving TPN at this time.  Right abdominal drain in place. Insertion site is unremarkable with sutures and StatLock in place. There is no redness, no drainage, no bleeding or other signs of infection.  There is approximately 60 cc purulent, serosanguineous output in JP with 100 cc documented in epic over the past 24 hours. Gram stain from output has resulted abundant WBCs, abundant gram-positive cocci, abundant gram-negative rods and rare gram variable rods.  Culture resulted abundant gram-negative rods.  WBC 16.9 08/01/21  from 17.5 07/31/2021 Patient last temp was 99.8, other VSS  Please continue to document output from drain in epic. Continue to flush drain 3 times daily. Change  dressing every shift or as needed. Contact IR with questions or concerns.  Electronically Signed: Shon Hough, NP 08/02/2021, 9:27 AM   I spent a total of 15 Minutes at the the patient's bedside AND on the patient's hospital floor or unit, greater than 50% of which was counseling/coordinating care for right abdominal drain.

## 2021-08-02 NOTE — Progress Notes (Signed)
OT Cancellation Note  Patient Details Name: KENITH TRICKEL MRN: [id number] DOB: 03-01-1971   Cancelled Treatment:    Reason Eval/Treat Not Completed: Patient declined, no reason specified. Pt refused OT session, stating that he was "too tired" and "in too much pain". Will follow up as able.  Hillard Danker, OT/L  Acute Rehab [phone]  Lenice Llamas 08/02/2021, 4:59 PM

## 2021-08-02 NOTE — Progress Notes (Signed)
Progress Note  14 Days Post-Op  Subjective: IR drain with significant drainage yesterday Temperature 100.4 last night Resting this morning  Objective: Vital signs in last 24 hours: Temp:  [98.2 F (36.8 C)-103.1 F (39.5 C)] 100.4 F (38 C) (10/19 0437) Pulse Rate:  [87-110] 100 (10/19 0437) Resp:  [14-19] 16 (10/18 1709) BP: (92-116)/(62-81) 92/62 (10/19 0437) SpO2:  [98 %-100 %] 99 % (10/19 0437) Last BM Date: 07/22/21  Intake/Output from previous day: 10/18 0701 - 10/19 0700 In: 4559.5 [I.V.:2999.8; IV Piggyback:1559.7] Out: 1550 [Urine:1450; Drains:100] Intake/Output this shift: No intake/output data recorded.  PE: General: WD, male who is laying in bed in NAD HEENT: head is normocephalic, atraumatic.  Mouth is pink and moist Heart: regular, rate, and rhythm.  Palpable radial pulses bilaterally Lungs: Respiratory effort nonlabored Abd: soft, appropriate diffuse TTP, midline dressing c/d/I, BS hypoactive. Drain with enteric contents present in gravity bag, IR drain with dark fluid MSK: no calf edema or TTP bilaterally Skin: warm and dry. No rashes   Lab Results:  Recent Labs    07/31/21 0333 08/01/21 0432  WBC 17.5* 16.9*  HGB 9.3* 9.0*  HCT 28.5* 27.5*  PLT 522* 512*    BMET Recent Labs    07/31/21 0333  NA 132*  K 3.9  CL 101  CO2 24  GLUCOSE 122*  BUN 16  CREATININE 0.68  CALCIUM 8.1*    PT/INR No results for input(s): LABPROT, INR in the last 72 hours. CMP     Component Value Date/Time   NA 132 (L) 07/31/2021 0333   K 3.9 07/31/2021 0333   CL 101 07/31/2021 0333   CO2 24 07/31/2021 0333   GLUCOSE 122 (H) 07/31/2021 0333   BUN 16 07/31/2021 0333   CREATININE 0.68 07/31/2021 0333   CALCIUM 8.1 (L) 07/31/2021 0333   PROT 7.5 07/31/2021 0333   ALBUMIN 1.6 (L) 07/31/2021 0333   AST 10 (L) 07/31/2021 0333   ALT 12 07/31/2021 0333   ALKPHOS 128 (H) 07/31/2021 0333   BILITOT 0.2 (L) 07/31/2021 0333   GFRNONAA >60 07/31/2021 0333    GFRAA >60 09/05/2019 2005   Lipase     Component Value Date/Time   LIPASE 40 07/10/2021 1008       Studies/Results: CT ABDOMEN PELVIS W CONTRAST  Result Date: 07/31/2021 CLINICAL DATA:  History of prior small bowel perforation with resection and repair, subsequent encounter EXAM: CT ABDOMEN AND PELVIS WITH CONTRAST TECHNIQUE: Multidetector CT imaging of the abdomen and pelvis was performed using the standard protocol following bolus administration of intravenous contrast. CONTRAST:  OMNIPAQUE IOHEXOL 300 MG/ML  SOLN COMPARISON:  07/21/2021 FINDINGS: Lower chest: Bilateral lower lobe consolidation is noted right greater than left relatively stable from the prior exam. Hepatobiliary: No focal liver abnormality is seen. No gallstones, gallbladder wall thickening, or biliary dilatation. A small focus of air is noted anterior to the liver best seen on image number 14 of series 3 likely related to the prior laparotomy. No other foci of free air are seen. Pancreas: Unremarkable. No pancreatic ductal dilatation or surrounding inflammatory changes. Spleen: Normal in size without focal abnormality. Adrenals/Urinary Tract: Adrenal glands are within normal limits. Kidneys demonstrate a normal enhancement pattern with normal excretion of contrast. The bladder is decompressed. A small amount of air is noted within the bladder likely related to prior instrumentation. Stomach/Bowel: Colon shows fecal material and contrast scattered throughout. No obstructive or inflammatory changes are seen. The appendix is not well visualized. Some  radiopaque density is noted adjacent to the cecum which may be related to prior appendectomy. Correlation with the clinical findings is recommended. The stomach is well visualized and within normal limits. Scattered contrast is noted throughout the small bowel. In the pelvis there is a loop of small bowel which demonstrates free communication with the previously seen intra-abdominal  abscess. Previously noted surgically placed drain is again seen lying within the previously noted abscess with contrast material identified within the abscess as well from the adjacent small bowel. Vascular/Lymphatic: Aortic atherosclerosis. No enlarged abdominal or pelvic lymph nodes. Reproductive: Prostate is unremarkable. Other: Previously seen bilobed pelvic abscess has decompressed significantly following the previously placed surgical drain although there remains a collection which measures approximately 7.2 x 5.6 cm in greatest dimension. Contrast is noted within from leakage from the adjacent small bowel loop. These changes are best noted on image number 42 of series 6 as well as image number 73 of series 3. There is a new bilobed fluid collection identified in the right upper quadrant compressing the adjacent bowel structures as well as the gallbladder. The dominant component measures approximately 9.5 by 8.8 cm in greatest AP and transverse dimensions. An air-fluid level is noted as well as enhancement of the wall. The smaller more posterior collection measures approximately 4.6 by 2.4 cm. Again these changes are new from the prior exam and consistent with postoperative abscess. This would likely be amenable to percutaneous drainage given its approximation with the anterior abdominal wall laterally. Musculoskeletal: No acute or significant osseous findings. IMPRESSION: Postoperative changes are noted with a percutaneous drain extending into a large pelvic abscess which has significantly reduced in size when compared with the prior exam however demonstrates poorly administered contrast within and findings of a communication between and adjacent small bowel loop and the abscess cavity deep within the pelvis as described. New bilobed air-fluid collection in the right upper quadrant consistent with postoperative abscess as described. Bilateral lower lobe consolidation right greater than left stable from the  prior study. Minimal free air consistent with the prior laparotomy. These results will be called to the ordering clinician or representative by the Radiologist Assistant, and communication documented in the PACS or [employer]. Electronically Signed   By: Alcide Clever M.D.   On: 07/31/2021 20:34   CT IMAGE GUIDED DRAINAGE BY PERCUTANEOUS CATHETER  Result Date: 08/01/2021 INDICATION: 50 year old with postoperative fluid collection in the right abdomen and concerning for a large abscess. EXAM: CT-GUIDED DRAINAGE OF RIGHT ABDOMINAL FLUID COLLECTION MEDICATIONS: Moderate sedation ANESTHESIA/SEDATION: Moderate (conscious) sedation was employed during this procedure. A total of Versed 2.0mg  and fentanyl 50 mcg was administered intravenously at the order of the provider performing the procedure. Total intra-service moderate sedation time: 22 minutes. Patient's level of consciousness and vital signs were monitored continuously by radiology nurse throughout the procedure under the supervision of the provider performing the procedure. COMPLICATIONS: None immediate. PROCEDURE: Informed written consent was obtained from the patient after a thorough discussion of the procedural risks, benefits and alternatives. All questions were addressed. A timeout was performed prior to the initiation of the procedure. Patient was placed supine on the CT scanner. Images through the abdomen were obtained. The large air-fluid collection in the right abdomen was identified and targeted. Right side of the abdomen was prepped and draped in sterile fashion. Maximal barrier sterile technique was utilized including caps, mask, sterile gowns, sterile gloves, sterile drape, hand hygiene and skin antiseptic. Skin was anesthetized with 1% lidocaine. A small incision  was made. Using CT guidance, 18 gauge trocar needle was directed into the large right abdominal fluid collection and foul-smelling brown fluid was aspirated. Superstiff Amplatz wire  was advanced into the collection. The tract was dilated to accommodate a 10 [place] multipurpose drain. 330 mL of brown foul-smelling fluid was removed. Follow up CT images were obtained. Drain was secured to skin with suture. Drain was attached to a suction bulb. FINDINGS: Large air-fluid collection in the right abdomen. Collection was decompressed at the end of the procedure. IMPRESSION: CT-guided placement of a drainage catheter within the large right abdominal fluid collection. Electronically Signed   By: Richarda Overlie M.D.   On: 08/01/2021 18:15    Anti-infectives: Anti-infectives (From admission, onward)    Start     Dose/Rate Route Frequency Ordered Stop   07/18/21 1000  piperacillin-tazobactam (ZOSYN) IVPB 3.375 g        3.375 g 12.5 mL/hr over 240 Minutes Intravenous Every 8 hours 07/18/21 0839     07/12/21 1200  ceFAZolin (ANCEF) IVPB 2g/100 mL premix        2 g 200 mL/hr over 30 Minutes Intravenous To Short Stay 07/12/21 0843 07/12/21 1100        Assessment/Plan POD#20 - S/P EXPLORATORY LAPAROTOMY; LYSIS OF ADHESIONS; SMALL INTESTINE REPAIR - 07/12/21 Dr. Sheliah Hatch for SBO POD#13 - S/P LAPAROTOMY, REPAIR OF SMALL BOWEL PERFORATION, PLACEMENT 1F BLAKE DRAIN - 07/19/21 Dr Carolynne Edouard - high output fistula - start octreotide - pelvic abscess drain upsized by IR 10/11, RUQ drain placed 10/18 - unrecorded and 100 mL yesterday.  Continue to gravity - PICC/TPN. NGT out - continue IV abx - OOB, mobilize with PT and staff  - incentive spirometry q 1h - continue PPI for suspected gastritis - multimodal pain control - added IV tylenol today  Attempted to update his niece [place] per his request yesterday, no answer   FEN - NPO, PICC/TNA 9/30 >> VTE - lovenox (has been refusing) ID - ancef x1 dose 9/28 perioperatively, Zosyn 10/4 >>  Foley -  removed, voiding well   Bipolar disorder Homelessness Polysubstance abuse   LOS: 23 days    Quentin Ore, MD [facility]  Surgery 08/02/2021, 7:36 AM Please see Amion for pager number during day hours 7:00am-4:30pm
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
## 2021-08-03 LAB — COMPREHENSIVE METABOLIC PANEL
ALT: 13 U/L (ref 0–44)
AST: 11 U/L — ABNORMAL LOW (ref 15–41)
Albumin: <1.5 g/dL — ABNORMAL LOW (ref 3.5–5.0)
Alkaline Phosphatase: 151 U/L — ABNORMAL HIGH (ref 38–126)
Anion gap: 9 (ref 5–15)
BUN: 15 mg/dL (ref 6–20)
CO2: 24 mmol/L (ref 22–32)
Calcium: 8.3 mg/dL — ABNORMAL LOW (ref 8.9–10.3)
Chloride: 103 mmol/L (ref 98–111)
Creatinine, Ser: 0.65 mg/dL (ref 0.61–1.24)
GFR, Estimated: >60 mL/min (ref >60)
Glucose, Bld: 121 mg/dL — ABNORMAL HIGH (ref 70–99)
Potassium: 4.1 mmol/L (ref 3.5–5.1)
Sodium: 136 mmol/L (ref 135–145)
Total Bilirubin: 0.2 mg/dL — ABNORMAL LOW (ref 0.3–1.2)
Total Protein: 7.4 g/dL (ref 6.5–8.1)

## 2021-08-03 LAB — GLUCOSE, CAPILLARY
Glucose-Capillary: 117 mg/dL — ABNORMAL HIGH (ref 70–99)
Glucose-Capillary: 96 mg/dL (ref 70–99)
Glucose-Capillary: 124 mg/dL — ABNORMAL HIGH (ref 70–99)
Glucose-Capillary: 117 mg/dL — ABNORMAL HIGH (ref 70–99)

## 2021-08-03 LAB — MAGNESIUM: Magnesium: 2.1 mg/dL (ref 1.7–2.4)

## 2021-08-03 LAB — PHOSPHORUS: Phosphorus: 3.7 mg/dL (ref 2.5–4.6)

## 2021-08-03 MED ORDER — TRAVASOL 10 % IV SOLN
INTRAVENOUS | Status: AC
  Filled 2021-08-03: qty 1188

## 2021-08-03 MED ORDER — SODIUM CHLORIDE 0.9 % IV SOLN
1.0000 g | Freq: Three times a day (TID) | INTRAVENOUS | Status: DC
  Administered 2021-08-03 – 2021-08-15 (×35): 1 g via INTRAVENOUS
  Filled 2021-08-03 (×38): qty 1
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
## 2021-08-03 NOTE — Progress Notes (Signed)
Referring Physician(s): CCS  Supervising Physician: Pernell Dupre  Patient Status:  [facility] - In-pt  Chief Complaint:  S/p RUQ and LLQ drain placement   Subjective:  Pt laying in bed, NAD.  Reports abdominal pain today.   Allergies: Patient has no known allergies.  Medications: Prior to Admission medications   Medication Sig Start Date End Date Taking? Authorizing Provider  benztropine (COGENTIN) 1 MG tablet Take 1 tablet (1 mg total) by mouth 2 (two) times daily. Patient not taking: Reported on 07/11/2021 02/05/19   Malvin Johns, MD  divalproex (DEPAKOTE ER) 500 MG 24 hr tablet Take 1 tablet (500 mg total) by mouth 2 (two) times daily. Patient not taking: Reported on 07/11/2021 02/05/19   Malvin Johns, MD  fluPHENAZine (PROLIXIN) 5 MG tablet Take 1 tablet (5 mg total) by mouth 3 (three) times daily. Patient not taking: Reported on 07/11/2021 02/05/19   Malvin Johns, MD  gabapentin (NEURONTIN) 300 MG capsule Take 1 capsule (300 mg total) by mouth 3 (three) times daily. Patient not taking: Reported on 07/11/2021 02/05/19   Malvin Johns, MD  lidocaine (LIDODERM) 5 % Place 1 patch onto the skin daily. Remove & Discard patch within 12 hours or as directed by MD Patient not taking: Reported on 07/11/2021 05/04/19   Caccavale, Sophia, PA-C  naproxen (NAPROSYN) 500 MG tablet Take 1 tablet (500 mg total) by mouth 2 (two) times daily with a meal. Patient not taking: Reported on 07/11/2021 05/04/19   Caccavale, Sophia, PA-C     Vital Signs: BP 104/73 (BP Location: Left Arm)   Pulse 77   Temp 98.4 F (36.9 C) (Oral)   Resp 14   Ht 5\' 9"  (1.753 m)   Wt 123 lb 7.3 oz (56 kg)   SpO2 100%   BMI 18.23 kg/m   Physical Exam Vitals reviewed.  Constitutional:      General: He is not in acute distress.    Appearance: He is ill-appearing.  HENT:     Head: Normocephalic and atraumatic.  Pulmonary:     Effort: Pulmonary effort is normal.  Abdominal:     General: Abdomen is flat.      Palpations: Abdomen is soft.  Skin:    General: Skin is warm and dry.     Comments: Positive RUQ drain to a suction bulb. Site is unremarkable with no erythema, edema, tenderness, bleeding or drainage. Suture and stat lock in place. Dressing is clean, dry, and intact. 10 ml of tan colored fluid noted in the bulb. Drain aspirates and flushes well.   Neurological:     Mental Status: He is alert and oriented to person, place, and time.  Psychiatric:        Mood and Affect: Mood normal.        Behavior: Behavior normal.    Imaging: CT ABDOMEN PELVIS W CONTRAST  Result Date: 07/31/2021 CLINICAL DATA:  History of prior small bowel perforation with resection and repair, subsequent encounter EXAM: CT ABDOMEN AND PELVIS WITH CONTRAST TECHNIQUE: Multidetector CT imaging of the abdomen and pelvis was performed using the standard protocol following bolus administration of intravenous contrast. CONTRAST:  08/02/2021 OMNIPAQUE IOHEXOL 300 MG/ML  SOLN COMPARISON:  07/21/2021 FINDINGS: Lower chest: Bilateral lower lobe consolidation is noted right greater than left relatively stable from the prior exam. Hepatobiliary: No focal liver abnormality is seen. No gallstones, gallbladder wall thickening, or biliary dilatation. A small focus of air is noted anterior to the liver best seen on image  number 14 of series 3 likely related to the prior laparotomy. No other foci of free air are seen. Pancreas: Unremarkable. No pancreatic ductal dilatation or surrounding inflammatory changes. Spleen: Normal in size without focal abnormality. Adrenals/Urinary Tract: Adrenal glands are within normal limits. Kidneys demonstrate a normal enhancement pattern with normal excretion of contrast. The bladder is decompressed. A small amount of air is noted within the bladder likely related to prior instrumentation. Stomach/Bowel: Colon shows fecal material and contrast scattered throughout. No obstructive or inflammatory changes are seen. The  appendix is not well visualized. Some radiopaque density is noted adjacent to the cecum which may be related to prior appendectomy. Correlation with the clinical findings is recommended. The stomach is well visualized and within normal limits. Scattered contrast is noted throughout the small bowel. In the pelvis there is a loop of small bowel which demonstrates free communication with the previously seen intra-abdominal abscess. Previously noted surgically placed drain is again seen lying within the previously noted abscess with contrast material identified within the abscess as well from the adjacent small bowel. Vascular/Lymphatic: Aortic atherosclerosis. No enlarged abdominal or pelvic lymph nodes. Reproductive: Prostate is unremarkable. Other: Previously seen bilobed pelvic abscess has decompressed significantly following the previously placed surgical drain although there remains a collection which measures approximately 7.2 x 5.6 cm in greatest dimension. Contrast is noted within from leakage from the adjacent small bowel loop. These changes are best noted on image number 42 of series 6 as well as image number 73 of series 3. There is a new bilobed fluid collection identified in the right upper quadrant compressing the adjacent bowel structures as well as the gallbladder. The dominant component measures approximately 9.5 by 8.8 cm in greatest AP and transverse dimensions. An air-fluid level is noted as well as enhancement of the wall. The smaller more posterior collection measures approximately 4.6 by 2.4 cm. Again these changes are new from the prior exam and consistent with postoperative abscess. This would likely be amenable to percutaneous drainage given its approximation with the anterior abdominal wall laterally. Musculoskeletal: No acute or significant osseous findings. IMPRESSION: Postoperative changes are noted with a percutaneous drain extending into a large pelvic abscess which has significantly  reduced in size when compared with the prior exam however demonstrates poorly administered contrast within and findings of a communication between and adjacent small bowel loop and the abscess cavity deep within the pelvis as described. New bilobed air-fluid collection in the right upper quadrant consistent with postoperative abscess as described. Bilateral lower lobe consolidation right greater than left stable from the prior study. Minimal free air consistent with the prior laparotomy. These results will be called to the ordering clinician or representative by the Radiologist Assistant, and communication documented in the PACS or [employer]. Electronically Signed   By: Alcide Clever M.D.   On: 07/31/2021 20:34   CT IMAGE GUIDED DRAINAGE BY PERCUTANEOUS CATHETER  Result Date: 08/01/2021 INDICATION: 50 year old with postoperative fluid collection in the right abdomen and concerning for a large abscess. EXAM: CT-GUIDED DRAINAGE OF RIGHT ABDOMINAL FLUID COLLECTION MEDICATIONS: Moderate sedation ANESTHESIA/SEDATION: Moderate (conscious) sedation was employed during this procedure. A total of Versed 2.0mg  and fentanyl 50 mcg was administered intravenously at the order of the provider performing the procedure. Total intra-service moderate sedation time: 22 minutes. Patient's level of consciousness and vital signs were monitored continuously by radiology nurse throughout the procedure under the supervision of the provider performing the procedure. COMPLICATIONS: None immediate. PROCEDURE: Informed written consent was  obtained from the patient after a thorough discussion of the procedural risks, benefits and alternatives. All questions were addressed. A timeout was performed prior to the initiation of the procedure. Patient was placed supine on the CT scanner. Images through the abdomen were obtained. The large air-fluid collection in the right abdomen was identified and targeted. Right side of the abdomen was  prepped and draped in sterile fashion. Maximal barrier sterile technique was utilized including caps, mask, sterile gowns, sterile gloves, sterile drape, hand hygiene and skin antiseptic. Skin was anesthetized with 1% lidocaine. A small incision was made. Using CT guidance, 18 gauge trocar needle was directed into the large right abdominal fluid collection and foul-smelling brown fluid was aspirated. Superstiff Amplatz wire was advanced into the collection. The tract was dilated to accommodate a 10 [place] multipurpose drain. 330 mL of brown foul-smelling fluid was removed. Follow up CT images were obtained. Drain was secured to skin with suture. Drain was attached to a suction bulb. FINDINGS: Large air-fluid collection in the right abdomen. Collection was decompressed at the end of the procedure. IMPRESSION: CT-guided placement of a drainage catheter within the large right abdominal fluid collection. Electronically Signed   By: Richarda Overlie M.D.   On: 08/01/2021 18:15    Labs:  CBC: Recent Labs    07/27/21 0410 07/28/21 0430 07/31/21 0333 08/01/21 0432  WBC 15.2* 12.5* 17.5* 16.9*  HGB 9.4* 9.4* 9.3* 9.0*  HCT 29.7* 28.8* 28.5* 27.5*  PLT 617* 632* 522* 512*    COAGS: Recent Labs    07/18/21 0823  INR 1.1    BMP: Recent Labs    07/28/21 0430 07/30/21 0313 07/31/21 0333 08/03/21 0359  NA 133* 134* 132* 136  K 3.9 4.0 3.9 4.1  CL 101 103 101 103  CO2 25 24 24 24   GLUCOSE 119* 121* 122* 121*  BUN 11 14 16 15   CALCIUM 8.1* 8.3* 8.1* 8.3*  CREATININE 0.58* 0.71 0.68 0.65  GFRNONAA >60 >60 >60 >60    LIVER FUNCTION TESTS: Recent Labs    07/24/21 0346 07/27/21 0410 07/31/21 0333 08/03/21 0359  BILITOT 0.3 0.5 0.2* 0.2*  AST 18 12* 10* 11*  ALT 21 14 12 13   ALKPHOS 101 119 128* 151*  PROT 7.4 7.2 7.5 7.4  ALBUMIN 1.7* 1.7* 1.6* <1.5*    Assessment and Plan:  50 y.o. male s/p ex lap with lysis of adhesions on 07/12/21 and repair  of small bowel perforation on 07/19/21.  Hospital course complicated by intra abdominal abscess development. Patient is s/p RUQ and LLQ drain placement and exchange with IR.   VSS afebrile  WBC 16.9, plt 512 on 10/18   Drain Location: LLQ - managed by CCS  Size: 20 fr Thal  Date of placement: 10/11 Currently to: Drain collection device: gravity bag  Output: 450 cc trending down   Drain Location: RUQ Size: 10 fr  Date of placement: 10/18  Currently to: Drain collection device: suction bulb Output 55 cc trending down   24 hour output:  Output by Drain (mL) 08/01/21 0701 - 08/01/21 1900 08/01/21 1901 - 08/02/21 0700 08/02/21 0701 - 08/02/21 1900 08/02/21 1901 - 08/03/21 0700 08/03/21 0701 - 08/03/21 0929  Closed System Drain Left LLQ Other (Comment) 20 Fr.   250 200   Closed System Drain 4 Lateral RUQ Bulb (JP) 10 Fr.  100 55 0     Interval imaging/drain manipulation:  LLQ drain placed on 10/4  - removed by surgery on 10/5,  surgery placed 19 fr Black drain  - exchanged to 20 fr Thal by IR on 10/11  RUQ drain placed on 10/18  - no manipulation   Current examination: Flushes/aspirates easily.  Insertion site unremarkable. Suture and stat lock in place. Dressed appropriately.   Plan: Continue TID flushes with 5 cc NS. Record output Q shift. Dressing changes QD or PRN if soiled.  Call IR APP or on call IR MD if difficulty flushing or sudden change in drain output.  Repeat imaging/possible drain injection once output < 10 mL/QD (excluding flush material.)  Further treatment plan per CCS Appreciate and agree with the plan.  IR to follow.    Electronically Signed: Willette Brace, PA-C 08/03/2021, 9:23 AM   I spent a total of 15 Minutes at the the patient's bedside AND on the patient's hospital floor or unit, greater than 50% of which was counseling/coordinating care for RUQ drain  This chart was dictated using voice recognition software.  Despite best efforts to proofread,  errors can occur which can change the  documentation meaning.

## 2021-08-03 NOTE — Plan of Care (Signed)
  Problem: Education: Goal: Knowledge of General Education information will improve Description: Including pain rating scale, medication(s)/side effects and non-pharmacologic comfort measures Outcome: Progressing   Problem: Health Behavior/Discharge Planning: Goal: Ability to manage health-related needs will improve Outcome: Not Progressing   Problem: Clinical Measurements: Goal: Ability to maintain clinical measurements within normal limits will improve Outcome: Progressing   Problem: Activity: Goal: Risk for activity intolerance will decrease Outcome: Progressing   Problem: Coping: Goal: Level of anxiety will decrease Outcome: Progressing

## 2021-08-03 NOTE — Progress Notes (Signed)
Progress Note  15 Days Post-Op  Subjective: IR drain with significant drainage yesterday Fever  101.1 last night 1935 Resting this morning  Objective: Vital signs in last 24 hours: Temp:  [97.5 F (36.4 C)-101.1 F (38.4 C)] 98.4 F (36.9 C) (10/20 0458) Pulse Rate:  [75-101] 75 (10/20 0458) Resp:  [18-19] 18 (10/20 0458) BP: (94-106)/(59-65) 106/61 (10/20 0458) SpO2:  [97 %-99 %] 97 % (10/20 0458) Weight:  [56 kg] 56 kg (10/20 0600) Last BM Date: 08/02/21  Intake/Output from previous day: 10/19 0701 - 10/20 0700 In: 2734.8 [I.V.:2182.8; IV Piggyback:542] Out: 1805 [Urine:1300; Drains:505] Intake/Output this shift: No intake/output data recorded.  PE: General: WD, male who is laying in bed in NAD HEENT: head is normocephalic, atraumatic.  Mouth is pink and moist Heart: regular, rate, and rhythm.  Palpable radial pulses bilaterally Lungs: Respiratory effort nonlabored Abd: soft, appropriate diffuse TTP, midline dressing c/d/I, BS hypoactive. Drain with enteric contents present in gravity bag, IR drain with dark fluid MSK: no calf edema or TTP bilaterally Skin: warm and dry. No rashes   Lab Results:  Recent Labs    08/01/21 0432  WBC 16.9*  HGB 9.0*  HCT 27.5*  PLT 512*    BMET Recent Labs    08/03/21 0359  NA 136  K 4.1  CL 103  CO2 24  GLUCOSE 121*  BUN 15  CREATININE 0.65  CALCIUM 8.3*    PT/INR No results for input(s): LABPROT, INR in the last 72 hours. CMP     Component Value Date/Time   NA 136 08/03/2021 0359   K 4.1 08/03/2021 0359   CL 103 08/03/2021 0359   CO2 24 08/03/2021 0359   GLUCOSE 121 (H) 08/03/2021 0359   BUN 15 08/03/2021 0359   CREATININE 0.65 08/03/2021 0359   CALCIUM 8.3 (L) 08/03/2021 0359   PROT 7.4 08/03/2021 0359   ALBUMIN <1.5 (L) 08/03/2021 0359   AST 11 (L) 08/03/2021 0359   ALT 13 08/03/2021 0359   ALKPHOS 151 (H) 08/03/2021 0359   BILITOT 0.2 (L) 08/03/2021 0359   GFRNONAA >60 08/03/2021 0359   GFRAA  >60 09/05/2019 2005   Lipase     Component Value Date/Time   LIPASE 40 07/10/2021 1008       Studies/Results: CT IMAGE GUIDED DRAINAGE BY PERCUTANEOUS CATHETER  Result Date: 08/01/2021 INDICATION: 50 year old with postoperative fluid collection in the right abdomen and concerning for a large abscess. EXAM: CT-GUIDED DRAINAGE OF RIGHT ABDOMINAL FLUID COLLECTION MEDICATIONS: Moderate sedation ANESTHESIA/SEDATION: Moderate (conscious) sedation was employed during this procedure. A total of Versed 2.0mg  and fentanyl 50 mcg was administered intravenously at the order of the provider performing the procedure. Total intra-service moderate sedation time: 22 minutes. Patient's level of consciousness and vital signs were monitored continuously by radiology nurse throughout the procedure under the supervision of the provider performing the procedure. COMPLICATIONS: None immediate. PROCEDURE: Informed written consent was obtained from the patient after a thorough discussion of the procedural risks, benefits and alternatives. All questions were addressed. A timeout was performed prior to the initiation of the procedure. Patient was placed supine on the CT scanner. Images through the abdomen were obtained. The large air-fluid collection in the right abdomen was identified and targeted. Right side of the abdomen was prepped and draped in sterile fashion. Maximal barrier sterile technique was utilized including caps, mask, sterile gowns, sterile gloves, sterile drape, hand hygiene and skin antiseptic. Skin was anesthetized with 1% lidocaine. A small incision was made.  Using CT guidance, 18 gauge trocar needle was directed into the large right abdominal fluid collection and foul-smelling brown fluid was aspirated. Superstiff Amplatz wire was advanced into the collection. The tract was dilated to accommodate a 10 [place] multipurpose drain. 330 mL of brown foul-smelling fluid was removed. Follow up CT images were  obtained. Drain was secured to skin with suture. Drain was attached to a suction bulb. FINDINGS: Large air-fluid collection in the right abdomen. Collection was decompressed at the end of the procedure. IMPRESSION: CT-guided placement of a drainage catheter within the large right abdominal fluid collection. Electronically Signed   By: Richarda Overlie M.D.   On: 08/01/2021 18:15    Anti-infectives: Anti-infectives (From admission, onward)    Start     Dose/Rate Route Frequency Ordered Stop   07/18/21 1000  piperacillin-tazobactam (ZOSYN) IVPB 3.375 g        3.375 g 12.5 mL/hr over 240 Minutes Intravenous Every 8 hours 07/18/21 0839     07/12/21 1200  ceFAZolin (ANCEF) IVPB 2g/100 mL premix        2 g 200 mL/hr over 30 Minutes Intravenous To Short Stay 07/12/21 0843 07/12/21 1100        Assessment/Plan POD#21 - S/P EXPLORATORY LAPAROTOMY; LYSIS OF ADHESIONS; SMALL INTESTINE REPAIR - 07/12/21 Dr. Sheliah Hatch for SBO POD#14 - S/P LAPAROTOMY, REPAIR OF SMALL BOWEL PERFORATION, PLACEMENT 65F BLAKE DRAIN - 07/19/21 Dr Carolynne Edouard - high output fistula - On octreotide - pelvic abscess drain upsized by IR 10/11, RUQ drain placed by IR 10/18 - 450 mL and 55 mL yesterday - PICC/TPN. NGT out - continue IV abx - OOB, mobilize with PT and staff  - incentive spirometry q 1h - continue PPI  - multimodal pain control - IV tylenol    FEN - NPO, PICC/TNA 9/30 >> VTE - lovenox (has been refusing) ID - ancef x1 dose 9/28 perioperatively, Zosyn 10/4 >>  Foley -  removed, voiding well   Bipolar disorder Homelessness Polysubstance abuse   LOS: 24 days    Quentin Ore, MD [facility] 08/03/2021, 8:22 AM Please see Amion for pager number during day hours 7:00am-4:30pm

## 2021-08-03 NOTE — Progress Notes (Signed)
PHARMACY - TOTAL PARENTERAL NUTRITION CONSULT NOTE  Indication: SBO/Prolonged ileus post-op  Patient Measurements: Height: 5\' 9"  (175.3 cm) Weight: 56 kg (123 lb 7.3 oz) IBW/kg (Calculated) : 70.7 TPN AdjBW (KG): 59 Body mass index is 18.23 kg/m. Usual Weight: 140 lbs  Assessment:  50 YOM presented on 07/10/2021 with abdominal pain, nausea, and vomiting for 3 days. PMH significant for polysubstance abuse, homelessness, bipolar disorder, SBO resulting in exlap, LOA and appendectomy at [facility] in May 2020. Patient reported one SBO since that was treated with NGT and improved. Pharmacy consulted to manage TPN for SBO, ileus.  Glucose / Insulin: no hx DM, A1c 5.3%. CBGs well controlled. SSI and CBGs D/C'd 07/22/21. Electrolytes: Na 136, K 4.1, Mg 2.1  (goal K >=4, Mg >=2) CoCa 10.3 Renal: SCr <1 stable, BUN WNL Hepatic: LFTs (11/13/151) / Tbili 0.2 / TG WNL, albumin <1.5 Intake / Output; MIVF: UOP 1.45 ml/kg/hr, drain output 200 mL, NG out, LBM 10/19 NS 50/hr  ID: Tmax 103 with WBC 17.5>>16.9 10/18 on Zosyn.10/17: New bilobed air-fluid collection in the right upper quadrant consistent with postoperative abscess. Currently afebrile GI meds: PPI for suspected gastritis GI Imaging: 9/26 xray abd: concerning for SBO - 9/26 CT: high grade pSBO, suspected internal hernia vs. complex adhesions in RLQ - 9/27 xray abd: continued SBO - 9/28 xray abd: multiple gas filled dilated loops similar to CT - 10/4 CT abdomen/pelvis: large dumbbell-shaped pelvic abscess - 10/7 CT - no change pelvic abscess, no evidence of bowel obstruction - 10/17: percutaneous drain extending into a large pelvic abscess which has significantly reduced in size>>findings of a communication between an adjacent small bowel loop and the abscess cavity deep within the pelvis as described.New bilobed air-fluid collection in the right upper quadrant consistent with postoperative abscess. Bilateral lower lobe consolidation right  greater than left stable from the prior study.  GI Surgeries / Procedures:  - 9/28 exlap, LOA, small intestine repair x2 - 10/4: LLQ drain catheter placement. - 10/5: ex lap with SBR for post-op bowel leak, drain placed -10/11: drain exchange  Central access: PICC placed 07/14/21 TPN start date: 07/14/21  Nutritional Goals:  RD recommendations increased 10/13 Estimated Needs Total Energy Estimated Needs: 2200-2400 Total Protein Estimated Needs: 110-130 grams Total Fluid Estimated Needs: >/= 2.0 L  Current Nutrition:  TPN; NPO  Plan: Continue TPN at goal rate 90 ml/hr at 1800. Will start cycling when clinically stable.  TPN will provide 119g AA, 65 g lLE (29%) , and 324 g CHO (GIR 3.98) for total 2225 kCal, meeting 100% of patient needs Electrolytes in TPN: Na 150 mEq/L, K 44 mEq/L, Mg 8 mEq/L, Phos 12 mmol/L, Ca 5 mEq/L, Cl:Ac 1:1 - Cannot increase K+ or Mg in TPN any further due to concentrations in the bag. Will have to do outside of TPN when needed Add standard MVI and trace elements to TPN Change NS to 50 ml/hr to keep up with drain output. Monitor I/O's and will continue to monitor for further reductions per 11/13.  Monitor standard TPN labs Mon/Thurs and PRN   Laurana Magistro BS, PharmD, BCPS Clinical Staff Pharmacist [email or web address] 08/03/2021 7:40 AM

## 2021-08-04 ENCOUNTER — Other Ambulatory Visit (HOSPITAL_COMMUNITY): Payer: Self-pay

## 2021-08-04 LAB — BASIC METABOLIC PANEL
Anion gap: 6 (ref 5–15)
BUN: 13 mg/dL (ref 6–20)
CO2: 25 mmol/L (ref 22–32)
Calcium: 8.3 mg/dL — ABNORMAL LOW (ref 8.9–10.3)
Chloride: 106 mmol/L (ref 98–111)
Creatinine, Ser: 0.62 mg/dL (ref 0.61–1.24)
GFR, Estimated: >60 mL/min (ref >60)
Glucose, Bld: 110 mg/dL — ABNORMAL HIGH (ref 70–99)
Potassium: 4.4 mmol/L (ref 3.5–5.1)
Sodium: 137 mmol/L (ref 135–145)

## 2021-08-04 LAB — CBC
HCT: 29 % — ABNORMAL LOW (ref 39.0–52.0)
Hemoglobin: 9.2 g/dL — ABNORMAL LOW (ref 13.0–17.0)
MCH: 28.3 pg (ref 26.0–34.0)
MCHC: 31.7 g/dL (ref 30.0–36.0)
MCV: 89.2 fL (ref 80.0–100.0)
Platelets: 544 10*3/uL — ABNORMAL HIGH (ref 150–400)
RBC: 3.25 MIL/uL — ABNORMAL LOW (ref 4.22–5.81)
RDW: 13.1 % (ref 11.5–15.5)
WBC: 12.9 10*3/uL — ABNORMAL HIGH (ref 4.0–10.5)
nRBC: 0 % (ref 0.0–0.2)

## 2021-08-04 LAB — GLUCOSE, CAPILLARY
Glucose-Capillary: 100 mg/dL — ABNORMAL HIGH (ref 70–99)
Glucose-Capillary: 109 mg/dL — ABNORMAL HIGH (ref 70–99)
Glucose-Capillary: 102 mg/dL — ABNORMAL HIGH (ref 70–99)
Glucose-Capillary: 113 mg/dL — ABNORMAL HIGH (ref 70–99)

## 2021-08-04 MED ORDER — LORAZEPAM 2 MG/ML IJ SOLN
0.5000 mg | Freq: Four times a day (QID) | INTRAMUSCULAR | Status: DC | PRN
  Filled 2021-08-04: qty 1

## 2021-08-04 MED ORDER — TRAVASOL 10 % IV SOLN
INTRAVENOUS | Status: AC
  Filled 2021-08-04: qty 1254

## 2021-08-04 NOTE — Progress Notes (Signed)
Pharmacy Note  Pharmacy asked to clarify patient's home meds - specifically ativan, depakote, seroquel. Per discussion with the patient, he has not been taking these medications for 4 months, nothing seen on fill records for the past 6 months, and PMP aware with no records of ativan fill.  Per old chart records (2020) - it appears at some point the patient was taking:  Depakote ER 500 mg bid Seroquel 100 mg hs  Let us know if we can be of further assistance.   Thank you for allowing pharmacy to be a part of this patient's care.  Georgina Pillion, PharmD, BCPS Clinical Pharmacist Clinical phone for 08/04/2021: [phone] 08/04/2021 1:24 PM   **Pharmacist phone directory can now be found on [email or web address] ([email or web address]).  Listed under [facility].

## 2021-08-04 NOTE — Progress Notes (Signed)
PHARMACY - TOTAL PARENTERAL NUTRITION CONSULT NOTE  Indication: SBO/Prolonged ileus post-op  Patient Measurements: Height: 5\' 9"  (175.3 cm) Weight: 56.3 kg (124 lb 1.9 oz) IBW/kg (Calculated) : 70.7 TPN AdjBW (KG): 59 Body mass index is 18.33 kg/m. Usual Weight: 140 lbs  Assessment:  50 YOM presented on 07/10/2021 with abdominal pain, nausea, and vomiting for 3 days. PMH significant for polysubstance abuse, homelessness, bipolar disorder, SBO resulting in exlap, LOA and appendectomy at [facility] in May 2020. Patient reported one SBO since that was treated with NGT and improved. Pharmacy consulted to manage TPN for SBO, ileus.  Glucose / Insulin: no hx DM, A1c 5.3%. CBGs well controlled. SSI and CBGs D/C'd 07/22/21. Electrolytes: Na 137, K 4.4, Mg 2.1  (goal K >=4, Mg >=2) CoCa 10.3 Renal: SCr <1 stable, BUN WNL Hepatic: LFTs (11/13/151) / Tbili 0.2 / TG WNL, albumin <1.5 Intake / Output; MIVF: UOP 0.96 ml/kg/hr, drain output 450 mL in past 24H, NG out, LBM 10/20 NS 50/hr  ID: Tmax 103 with WBC 17.5>>12.9 10/21. bilobed air-fluid collection in the right upper quadrant consistent with postoperative abscess. Zosyn started 10/17; ended 10/20. Surgical abscess growing E. Coli resistant to Zosyn. Merrem started 10/21. T 100.4 F GI meds: PPI for suspected gastritis GI Imaging: 9/26 xray abd: concerning for SBO - 9/26 CT: high grade pSBO, suspected internal hernia vs. complex adhesions in RLQ - 9/27 xray abd: continued SBO  - 9/28 xray abd: multiple gas filled dilated loops similar to CT  - 10/4 CT abdomen/pelvis: large dumbbell-shaped pelvic abscess  - 10/7 CT - no change pelvic abscess, no evidence of bowel obstruction  - 10/17: percutaneous drain extending into a large pelvic abscess which has significantly reduced in size>>findings of a communication between an adjacent small bowel loop and the abscess cavity deep within the pelvis as described.New bilobed air-fluid collection in the  right upper quadrant consistent with postoperative abscess. Bilateral lower lobe consolidation right greater than left stable from the prior study.  -10/18: CT guided grainage of RT abdominal fluid collection- A small incision was made. 18 gauge trocar needle was directed into the large right abdominal fluid collection and foul-smelling brown fluid was aspirated. Superstiff Amplatz wire was advanced into the collection. The tract was dilated to accommodate a 10 04-09-1985 multipurpose drain. 330 mL of brown foul-smelling fluid was removed  GI Surgeries / Procedures:  - 9/28 exlap, LOA, small intestine repair x2 - 10/4: LLQ drain catheter placement. - 10/5: ex lap with SBR for post-op bowel leak, drain placed -10/11: drain exchange  Central access: PICC placed 07/14/21 TPN start date: 07/14/21  Nutritional Goals:  RD recommendations increased 10/20 Estimated Needs Total Energy Estimated Needs: 2300-2500 Total Protein Estimated Needs: 110-130 grams Total Fluid Estimated Needs: >/= 2.0 L  Current Nutrition:  TPN; NPO  Plan: Change TPN  rate to 95 ml/hr at 1800. Will start cycling when clinically stable.  TPN will provide 125.4 g AA (63%), 68.4 g lLE (37%) , and 342 g CHO (GIR 4.22) for total 2348 kCal, meeting 100% of patient needs Electrolytes in TPN: Na 150 mEq/L, K 44 mEq/L, Mg 8 mEq/L, Phos 12 mmol/L, Ca 5 mEq/L, Cl:Ac 1:1 - Cannot increase K+ or Mg in TPN any further due to concentrations in the bag. Will have to do outside of TPN when needed Add standard MVI and trace elements to TPN Monitor I/O's and will continue to monitor for further reductions per 11/20. I/O previous 24H: 2583/1790 mL (+793  mL)  Monitor standard TPN labs Mon/Thurs and PRN   Carrissa Taitano BS, PharmD, BCPS Clinical Staff Pharmacist [email or web address] 08/04/2021 8:02 AM

## 2021-08-04 NOTE — Social Work (Signed)
CSW reached out to financial counseling for pt to be screened for medicaid.   TOC will follow.   Jimmy Picket, LCSW Clinical Social Worker

## 2021-08-04 NOTE — Progress Notes (Addendum)
Progress Note  I personally saw the patient and performed a substantive portion of this encounter, including a complete performance of at least one of the key components (MDM, Hx and/or Exam), in conjunction with the Advanced Practice Provider Carl Best, PA-C.  Quentin Ore, MD    16 Days Post-Op  Subjective: No acute changes. Abdominal pain stable. He is asking about his psychiatric medications. He states still passing flatus and BM yesterday  Objective: Vital signs in last 24 hours: Temp:  [98.2 F (36.8 C)-100.4 F (38 C)] 100.4 F (38 C) (10/21 0723) Pulse Rate:  [77-94] 92 (10/21 0723) Resp:  [14-19] 18 (10/21 0723) BP: (97-114)/(60-74) 105/74 (10/21 0723) SpO2:  [98 %-100 %] 100 % (10/21 0723) Weight:  [56.3 kg] 56.3 kg (10/21 0500) Last BM Date: 08/03/21  Intake/Output from previous day: 10/20 0701 - 10/21 0700 In: 2582.8 [I.V.:2072.8; IV Piggyback:500] Out: 1790 [Urine:1300; Drains:490] Intake/Output this shift: No intake/output data recorded.  PE: General: WD, male who is laying in bed in NAD HEENT: head is normocephalic, atraumatic.  Mouth is pink and moist Heart: regular, rate, and rhythm.  Palpable radial pulses bilaterally Lungs: Respiratory effort nonlabored Abd: soft, appropriate diffuse TTP, midline dressing c/d/I, BS hypoactive. Drain with enteric contents present in gravity bag, RUQ IR drain with dark fluid MSK: no calf edema or TTP bilaterally Skin: warm and dry. No rashes   Lab Results:  Recent Labs    08/04/21 0419  WBC 12.9*  HGB 9.2*  HCT 29.0*  PLT 544*    BMET Recent Labs    08/03/21 0359 08/04/21 0419  NA 136 137  K 4.1 4.4  CL 103 106  CO2 24 25  GLUCOSE 121* 110*  BUN 15 13  CREATININE 0.65 0.62  CALCIUM 8.3* 8.3*    PT/INR No results for input(s): LABPROT, INR in the last 72 hours. CMP     Component Value Date/Time   NA 137 08/04/2021 0419   K 4.4 08/04/2021 0419   CL 106 08/04/2021 0419   CO2 25  08/04/2021 0419   GLUCOSE 110 (H) 08/04/2021 0419   BUN 13 08/04/2021 0419   CREATININE 0.62 08/04/2021 0419   CALCIUM 8.3 (L) 08/04/2021 0419   PROT 7.4 08/03/2021 0359   ALBUMIN <1.5 (L) 08/03/2021 0359   AST 11 (L) 08/03/2021 0359   ALT 13 08/03/2021 0359   ALKPHOS 151 (H) 08/03/2021 0359   BILITOT 0.2 (L) 08/03/2021 0359   GFRNONAA >60 08/04/2021 0419   GFRAA >60 09/05/2019 2005   Lipase     Component Value Date/Time   LIPASE 40 07/10/2021 1008       Studies/Results: No results found.  Anti-infectives: Anti-infectives (From admission, onward)    Start     Dose/Rate Route Frequency Ordered Stop   08/03/21 1900  meropenem (MERREM) 1 g in sodium chloride 0.9 % 100 mL IVPB        1 g 200 mL/hr over 30 Minutes Intravenous Every 8 hours 08/03/21 1527     07/18/21 1000  piperacillin-tazobactam (ZOSYN) IVPB 3.375 g  Status:  Discontinued        3.375 g 12.5 mL/hr over 240 Minutes Intravenous Every 8 hours 07/18/21 0839 08/03/21 1527   07/12/21 1200  ceFAZolin (ANCEF) IVPB 2g/100 mL premix        2 g 200 mL/hr over 30 Minutes Intravenous To Short Stay 07/12/21 0843 07/12/21 1100        Assessment/Plan POD#23 - S/P EXPLORATORY LAPAROTOMY;  LYSIS OF ADHESIONS; SMALL INTESTINE REPAIR - 07/12/21 Dr. Sheliah Hatch for SBO POD#16 - S/P LAPAROTOMY, REPAIR OF SMALL BOWEL PERFORATION, PLACEMENT 29F BLAKE DRAIN - 07/19/21 Dr Carolynne Edouard - high output fistula - On octreotide - pelvic abscess drain upsized by IR 10/11, RUQ drain placed by IR 10/18 - 450 mL and 40 mL yesterday - PICC/TPN. NGT out - tmax 100.28F this am. WBC 12.9 (16.9). drain culture with ESBL e coli resistant to zosyn, switched to merrem 10/20 - continue IV abx - OOB, mobilize with PT and staff  - incentive spirometry q 1h - continue PPI  - multimodal pain control - IV tylenol  Pharmacy consult to verify home psychiatric meds - he reports use of depakote, ativan, and seroquel baseline but only depakote in chart. Pharm  consult to verify    FEN - NPO, PICC/TNA 9/30 >> VTE - lovenox (has been refusing) ID - ancef x1 dose 9/28 perioperatively, Zosyn 10/4 >> 10/20, Merrem 10/20>> Foley -  removed, voiding well   Bipolar disorder Homelessness Polysubstance abuse   LOS: 25 days    Eric Form, [facility] 08/04/2021, 8:11 AM Please see Amion for pager number during day hours 7:00am-4:30pm

## 2021-08-05 LAB — BASIC METABOLIC PANEL
Anion gap: 5 (ref 5–15)
BUN: 13 mg/dL (ref 6–20)
CO2: 25 mmol/L (ref 22–32)
Calcium: 8.4 mg/dL — ABNORMAL LOW (ref 8.9–10.3)
Chloride: 103 mmol/L (ref 98–111)
Creatinine, Ser: 0.64 mg/dL (ref 0.61–1.24)
GFR, Estimated: >60 mL/min (ref >60)
Glucose, Bld: 115 mg/dL — ABNORMAL HIGH (ref 70–99)
Potassium: 4.4 mmol/L (ref 3.5–5.1)
Sodium: 133 mmol/L — ABNORMAL LOW (ref 135–145)

## 2021-08-05 LAB — CBC
HCT: 29.8 % — ABNORMAL LOW (ref 39.0–52.0)
Hemoglobin: 9.7 g/dL — ABNORMAL LOW (ref 13.0–17.0)
MCH: 29 pg (ref 26.0–34.0)
MCHC: 32.6 g/dL (ref 30.0–36.0)
MCV: 89 fL (ref 80.0–100.0)
Platelets: 558 10*3/uL — ABNORMAL HIGH (ref 150–400)
RBC: 3.35 MIL/uL — ABNORMAL LOW (ref 4.22–5.81)
RDW: 13.1 % (ref 11.5–15.5)
WBC: 13 10*3/uL — ABNORMAL HIGH (ref 4.0–10.5)
nRBC: 0 % (ref 0.0–0.2)

## 2021-08-05 LAB — GLUCOSE, CAPILLARY
Glucose-Capillary: 122 mg/dL — ABNORMAL HIGH (ref 70–99)
Glucose-Capillary: 122 mg/dL — ABNORMAL HIGH (ref 70–99)
Glucose-Capillary: 126 mg/dL — ABNORMAL HIGH (ref 70–99)
Glucose-Capillary: 124 mg/dL — ABNORMAL HIGH (ref 70–99)

## 2021-08-05 MED ORDER — TRAVASOL 10 % IV SOLN
INTRAVENOUS | Status: AC
  Filled 2021-08-05: qty 1254

## 2021-08-05 MED ORDER — SODIUM CHLORIDE 0.9% FLUSH
5.0000 mL | Freq: Three times a day (TID) | INTRAVENOUS | Status: DC
  Administered 2021-08-05 – 2021-08-17 (×29): 5 mL

## 2021-08-05 NOTE — Progress Notes (Signed)
17 Days Post-Op   Subjective/Chief Complaint: Complains of pain, about the same   Objective: Vital signs in last 24 hours: Temp:  [98.3 F (36.8 C)-100 F (37.8 C)] 98.4 F (36.9 C) (10/22 0803) Pulse Rate:  [80-88] 80 (10/22 0803) Resp:  [17-18] 17 (10/22 0803) BP: (99-111)/(69-76) 107/76 (10/22 0803) SpO2:  [98 %-100 %] 99 % (10/22 0803) Weight:  [55.7 kg] 55.7 kg (10/22 0607) Last BM Date: 08/04/21  Intake/Output from previous day: 10/21 0701 - 10/22 0700 In: 1057.4 [P.O.:240; I.V.:612.4; IV Piggyback:200] Out: 1415 [Urine:1200; Drains:215] Intake/Output this shift: No intake/output data recorded.  General appearance: alert and cooperative Resp: clear to auscultation bilaterally Cardio: regular rate and rhythm GI: tender with bilious drain output  Lab Results:  Recent Labs    08/04/21 0419 08/05/21 0356  WBC 12.9* 13.0*  HGB 9.2* 9.7*  HCT 29.0* 29.8*  PLT 544* 558*   BMET Recent Labs    08/04/21 0419 08/05/21 0356  NA 137 133*  K 4.4 4.4  CL 106 103  CO2 25 25  GLUCOSE 110* 115*  BUN 13 13  CREATININE 0.62 0.64  CALCIUM 8.3* 8.4*   PT/INR No results for input(s): LABPROT, INR in the last 72 hours. ABG No results for input(s): PHART, HCO3 in the last 72 hours.  Invalid input(s): PCO2, PO2  Studies/Results: No results found.  Anti-infectives: Anti-infectives (From admission, onward)    Start     Dose/Rate Route Frequency Ordered Stop   08/03/21 1900  meropenem (MERREM) 1 g in sodium chloride 0.9 % 100 mL IVPB        1 g 200 mL/hr over 30 Minutes Intravenous Every 8 hours 08/03/21 1527     07/18/21 1000  piperacillin-tazobactam (ZOSYN) IVPB 3.375 g  Status:  Discontinued        3.375 g 12.5 mL/hr over 240 Minutes Intravenous Every 8 hours 07/18/21 0839 08/03/21 1527   07/12/21 1200  ceFAZolin (ANCEF) IVPB 2g/100 mL premix        2 g 200 mL/hr over 30 Minutes Intravenous To Short Stay 07/12/21 0843 07/12/21 1100        Assessment/Plan: s/p Procedure(s): EXPLORATORY LAPAROTOMY SMALL BOWEL REPAIR OF PERFORATION AND DRAINAGE OF INTERABDOMINAL CYST (N/A) Continue bowel rest and tpn for malnutrition POD#24 - S/P EXPLORATORY LAPAROTOMY; LYSIS OF ADHESIONS; SMALL INTESTINE REPAIR - 07/12/21 Dr. Sheliah Hatch for SBO POD#17- S/P LAPAROTOMY, REPAIR OF SMALL BOWEL PERFORATION, PLACEMENT 88F BLAKE DRAIN - 07/19/21 Dr Carolynne Edouard - high output fistula - On octreotide - pelvic abscess drain upsized by IR 10/11, RUQ drain placed by IR 10/18 - 450 mL and 40 mL yesterday - PICC/TPN. NGT out - tmax 100.80F this am. WBC 12.9 (16.9). drain culture with ESBL e coli resistant to zosyn, switched to merrem 10/20 - continue IV abx - OOB, mobilize with PT and staff  - incentive spirometry q 1h - continue PPI  - multimodal pain control - IV tylenol   Pharmacy consult to verify home psychiatric meds - he reports use of depakote, ativan, and seroquel baseline but only depakote in chart. Pharm consult to verify     FEN - NPO, PICC/TNA 9/30 >> VTE - lovenox (has been refusing) ID - ancef x1 dose 9/28 perioperatively, Zosyn 10/4 >> 10/20, Merrem 10/20>> Foley -  removed, voiding well   Bipolar disorder Homelessness Polysubstance abuse  LOS: 26 days    Thomas [name] 08/05/2021

## 2021-08-05 NOTE — Progress Notes (Signed)
PHARMACY - TOTAL PARENTERAL NUTRITION CONSULT NOTE  Indication: SBO/Prolonged ileus post-op  Patient Measurements: Height: 5\' 9"  (175.3 cm) Weight: 55.7 kg (122 lb 12.7 oz) IBW/kg (Calculated) : 70.7 TPN AdjBW (KG): 59 Body mass index is 18.13 kg/m. Usual Weight: 140 lbs  Assessment:  Thomas [name] presented on 07/10/2021 with abdominal pain, nausea, and vomiting for 3 days. PMH significant for polysubstance abuse, homelessness, bipolar disorder, SBO resulting in exlap, LOA and appendectomy at [facility] in May 2020. Patient reported one SBO since that was treated with NGT and improved. Pharmacy consulted to manage TPN for SBO, ileus.  Glucose / Insulin: no hx DM, A1c 5.3%. CBGs <150, off SSI Electrolytes: Na 133, K 4.4,  Mg 2.1  (goal K >=4, Mg >=2) CoCa 10.4 (or higher, hard to estimate w/ alb<1.5) Renal: SCr <1 stable, BUN WNL Hepatic: LFTs (11/13/151) / Tbili 0.2 / TG WNL, albumin <1.5 Intake / Output; MIVF: UOP 0.9 ml/kg/hr, drain output/24h: 200 cc, NG out, LBM 10/21 ID: Meropenem D#3 for ESBL + proteus pelvic abscess. Tmax 00.4, WBC 13   GI meds: PPI for suspected gastritis GI Imaging: 9/26 xray abd: concerning for SBO - 9/26 CT: high grade pSBO, suspected internal hernia vs. complex adhesions in RLQ - 9/27 xray abd: continued SBO - 9/28 xray abd: multiple gas filled dilated loops similar to CT - 10/4 CT abdomen/pelvis: large dumbbell-shaped pelvic abscess - 10/7 CT - no change pelvic abscess, no evidence of bowel obstruction - 10/17 Abd CT: Reduced pelvic abscess, new fluid collection in RUQ consistent w/ post-op abscess GI Surgeries / Procedures:  - 9/28 exlap, LOA, small intestine repair x2 - 10/4: LLQ drain catheter placement. - 10/5: ex lap with SBR for post-op bowel leak, drain placed -10/11: drain exchange - 10/18: Placement of drain for R-abd fluid collection  Central access: PICC placed 07/14/21 TPN start date: 07/14/21  Nutritional Goals:  RD recommendations increased  10/20 Estimated Needs Total Energy Estimated Needs: 2300-2500 Total Protein Estimated Needs: 110-130 grams Total Fluid Estimated Needs: >/= 2.0 L  Current Nutrition:  TPN; NPO  Plan: - Continue TPN at new goal rate - will plan to start cycling - Cycle 2280 cc over 18 hours - 67 cc/hr for the first hour, then increase to 134 cc/hr for 16 hours, then down to 67 cc/hr for the last hour, then stop - TPN will provide 125.4 g AA (63%), 68.4 g lLE (37%) , and 342 g CHO for total 2348 kCal, meeting 100% of patient needs - Electrolytes in TPN - changes 10/22: Na 150 mEq/L, K 44 mEq/L, Mg 8 mEq/L, Phos 12 mmol/L, Ca 5 mEq/L, Cl:Ac 1:1 - Cannot increase K+ or Mg in TPN any further due to concentrations in the bag. Will have to do outside of TPN when needed - MIVF - NS @ 50 cc/hr per surgery - Add standard MVI and trace elements to TPN - Monitor standard TPN labs Mon/Thurs and PRN  Thank you for allowing pharmacy to be a part of this patient's care.  11/22, PharmD, BCPS Clinical Pharmacist Clinical phone for 08/05/2021: 08/07/2021 08/05/2021 9:36 AM   **Pharmacist phone directory can now be found on [email or web address] ([email or web address]).  Listed under [facility].

## 2021-08-06 LAB — BASIC METABOLIC PANEL
Anion gap: 9 (ref 5–15)
BUN: 18 mg/dL (ref 6–20)
CO2: 23 mmol/L (ref 22–32)
Calcium: 8.7 mg/dL — ABNORMAL LOW (ref 8.9–10.3)
Chloride: 102 mmol/L (ref 98–111)
Creatinine, Ser: 0.6 mg/dL — ABNORMAL LOW (ref 0.61–1.24)
GFR, Estimated: >60 mL/min (ref >60)
Glucose, Bld: 104 mg/dL — ABNORMAL HIGH (ref 70–99)
Potassium: 4.8 mmol/L (ref 3.5–5.1)
Sodium: 134 mmol/L — ABNORMAL LOW (ref 135–145)

## 2021-08-06 LAB — AEROBIC/ANAEROBIC CULTURE W GRAM STAIN (SURGICAL/DEEP WOUND)

## 2021-08-06 LAB — CBC
HCT: 31.7 % — ABNORMAL LOW (ref 39.0–52.0)
Hemoglobin: 10 g/dL — ABNORMAL LOW (ref 13.0–17.0)
MCH: 28.2 pg (ref 26.0–34.0)
MCHC: 31.5 g/dL (ref 30.0–36.0)
MCV: 89.5 fL (ref 80.0–100.0)
Platelets: 589 10*3/uL — ABNORMAL HIGH (ref 150–400)
RBC: 3.54 MIL/uL — ABNORMAL LOW (ref 4.22–5.81)
RDW: 12.9 % (ref 11.5–15.5)
WBC: 10.8 10*3/uL — ABNORMAL HIGH (ref 4.0–10.5)
nRBC: 0 % (ref 0.0–0.2)

## 2021-08-06 LAB — GLUCOSE, CAPILLARY: Glucose-Capillary: 120 mg/dL — ABNORMAL HIGH (ref 70–99)

## 2021-08-06 MED ORDER — TRAVASOL 10 % IV SOLN
INTRAVENOUS | Status: AC
  Filled 2021-08-06: qty 1254

## 2021-08-06 NOTE — Progress Notes (Signed)
18 Days Post-Op   Subjective/Chief Complaint: Still complains of pain unchanged. Wants his psych meds if possible   Objective: Vital signs in last 24 hours: Temp:  [97.7 F (36.5 C)-98.4 F (36.9 C)] 98.2 F (36.8 C) (10/23 0843) Pulse Rate:  [76-97] 90 (10/23 0843) Resp:  [17-19] 19 (10/23 0843) BP: (94-102)/(63-69) 97/68 (10/23 0843) SpO2:  [98 %-100 %] 100 % (10/23 0843) Last BM Date: 08/04/21  Intake/Output from previous day: 10/22 0701 - 10/23 0700 In: 4968.1 [I.V.:4068.1; IV Piggyback:900] Out: 400 [Urine:400] Intake/Output this shift: No intake/output data recorded.  General appearance: alert and cooperative Resp: clear to auscultation bilaterally Cardio: regular rate and rhythm GI: appropriately tender with drains in place  Lab Results:  Recent Labs    08/05/21 0356 08/06/21 0430  WBC 13.0* 10.8*  HGB 9.7* 10.0*  HCT 29.8* 31.7*  PLT 558* 589*   BMET Recent Labs    08/05/21 0356 08/06/21 0430  NA 133* 134*  K 4.4 4.8  CL 103 102  CO2 25 23  GLUCOSE 115* 104*  BUN 13 18  CREATININE 0.64 0.60*  CALCIUM 8.4* 8.7*   PT/INR No results for input(s): LABPROT, INR in the last 72 hours. ABG No results for input(s): PHART, HCO3 in the last 72 hours.  Invalid input(s): PCO2, PO2  Studies/Results: No results found.  Anti-infectives: Anti-infectives (From admission, onward)    Start     Dose/Rate Route Frequency Ordered Stop   08/03/21 1900  meropenem (MERREM) 1 g in sodium chloride 0.9 % 100 mL IVPB        1 g 200 mL/hr over 30 Minutes Intravenous Every 8 hours 08/03/21 1527     07/18/21 1000  piperacillin-tazobactam (ZOSYN) IVPB 3.375 g  Status:  Discontinued        3.375 g 12.5 mL/hr over 240 Minutes Intravenous Every 8 hours 07/18/21 0839 08/03/21 1527   07/12/21 1200  ceFAZolin (ANCEF) IVPB 2g/100 mL premix        2 g 200 mL/hr over 30 Minutes Intravenous To Short Stay 07/12/21 0843 07/12/21 1100       Assessment/Plan: s/p  Procedure(s): EXPLORATORY LAPAROTOMY SMALL BOWEL REPAIR OF PERFORATION AND DRAINAGE OF INTERABDOMINAL CYST (N/A) Continue bowel rest and tpn for nutrition support POD#25 - S/P EXPLORATORY LAPAROTOMY; LYSIS OF ADHESIONS; SMALL INTESTINE REPAIR - 07/12/21 Dr. Sheliah Hatch for SBO POD#17- S/P LAPAROTOMY, REPAIR OF SMALL BOWEL PERFORATION, PLACEMENT 31F BLAKE DRAIN - 07/19/21 Dr Carolynne Edouard - high output fistula - On octreotide - pelvic abscess drain upsized by IR 10/11, RUQ drain placed by IR 10/18 - 450 mL and 40 mL yesterday - PICC/TPN. NGT out -WBC 10.8. drain culture with ESBL e coli resistant to zosyn, switched to merrem 10/20 - continue IV abx - OOB, mobilize with PT and staff  - incentive spirometry q 1h - continue PPI  - multimodal pain control - IV tylenol   Pharmacy consult to verify home psychiatric meds - he reports use of depakote, ativan, and seroquel baseline but only depakote in chart. Pharm consult to verify  May need Psych consult to readjust meds   FEN - NPO, PICC/TNA 9/30 >> VTE - lovenox (has been refusing) ID - ancef x1 dose 9/28 perioperatively, Zosyn 10/4 >> 10/20, Merrem 10/20>> Foley -  removed, voiding well   Bipolar disorder Homelessness Polysubstance abuse  LOS: 27 days    Thomas [name] 08/06/2021

## 2021-08-06 NOTE — Progress Notes (Signed)
PHARMACY - TOTAL PARENTERAL NUTRITION CONSULT NOTE  Indication: SBO/Prolonged ileus post-op  Patient Measurements: Height: 5\' 9"  (175.3 cm) Weight: 55.7 kg (122 lb 12.7 oz) IBW/kg (Calculated) : 70.7 TPN AdjBW (KG): 59 Body mass index is 18.13 kg/m. Usual Weight: 140 lbs  Assessment:  50 YOM presented on 07/10/2021 with abdominal pain, nausea, and vomiting for 3 days. PMH significant for polysubstance abuse, homelessness, bipolar disorder, SBO resulting in exlap, LOA and appendectomy at [facility] in May 2020. Patient reported one SBO since that was treated with NGT and improved. Pharmacy consulted to manage TPN for SBO, ileus.  Glucose / Insulin: no hx DM, A1c 5.3%. CBGs <150, off SSI Electrolytes: Na 134, K 4.8,  Mg 2.1  (goal K >=4, Mg >=2) CoCa 10.4 (or higher, hard to estimate w/ alb<1.5) Renal: SCr <1 stable, BUN WNL Hepatic: LFTs (11/13/151) / Tbili 0.2 / TG WNL, albumin <1.5 Intake / Output; MIVF: UOP 0.3 ml/kg/hr (seems not accurately charted) drain output/24h: not charted, NG out, LBM 10/21 ID: Meropenem D#4 for ESBL + proteus pelvic abscess. Afeb, WBC 10.8 << 13  GI meds: PPI for suspected gastritis GI Imaging: 9/26 xray abd: concerning for SBO - 9/26 CT: high grade pSBO, suspected internal hernia vs. complex adhesions in RLQ - 9/27 xray abd: continued SBO - 9/28 xray abd: multiple gas filled dilated loops similar to CT - 10/4 CT abdomen/pelvis: large dumbbell-shaped pelvic abscess - 10/7 CT - no change pelvic abscess, no evidence of bowel obstruction - 10/17 Abd CT: Reduced pelvic abscess, new fluid collection in RUQ consistent w/ post-op abscess GI Surgeries / Procedures:  - 9/28 exlap, LOA, small intestine repair x2 - 10/4: LLQ drain catheter placement. - 10/5: ex lap with SBR for post-op bowel leak, drain placed -10/11: drain exchange - 10/18: Placement of drain for R-abd fluid collection  Central access: PICC placed 07/14/21 TPN start date: 07/14/21  Nutritional  Goals:  RD recommendations increased 10/20 Estimated Needs Total Energy Estimated Needs: 2300-2500 Total Protein Estimated Needs: 110-130 grams Total Fluid Estimated Needs: >/= 2.0 L  Current Nutrition:  TPN; NPO  Plan: - Continue TPN at new goal rate - started cycling 10/22 - Cycle 2280 cc over 18 hours - 67 cc/hr for the first hour, then increase to 134 cc/hr for 16 hours, then down to 67 cc/hr for the last hour, then stop - Will consider cycle reduction with 10/24 TPN bag if tolerating - TPN will provide 125.4 g AA (63%), 68.4 g lLE (37%) , and 342 g CHO for total 2348 kCal, meeting 100% of patient needs - Electrolytes in TPN - no changes 10/23: Na 150 mEq/L, K 44 mEq/L, Mg 8 mEq/L, Phos 12 mmol/L, Ca 5 mEq/L, Cl:Ac 1:1 - Cannot increase K+ or Mg in TPN any further due to concentrations in the bag. Will have to do outside of TPN when needed - MIVF - NS @ 50 cc/hr per surgery - Add standard MVI and trace elements to TPN - Monitor standard TPN labs Mon/Thurs and PRN  Thank you for allowing pharmacy to be a part of this patient's care.  11/23, PharmD, BCPS Clinical Pharmacist Clinical phone for 08/06/2021: [phone] 08/06/2021 7:23 AM   **Pharmacist phone directory can now be found on [email or web address] ([email or web address]).  Listed under [facility].

## 2021-08-07 DIAGNOSIS — F3132 Bipolar disorder, current episode depressed, moderate: Secondary | ICD-10-CM

## 2021-08-07 LAB — COMPREHENSIVE METABOLIC PANEL
ALT: 20 U/L (ref 0–44)
AST: 17 U/L (ref 15–41)
Albumin: 1.9 g/dL — ABNORMAL LOW (ref 3.5–5.0)
Alkaline Phosphatase: 111 U/L (ref 38–126)
Anion gap: 8 (ref 5–15)
BUN: 22 mg/dL — ABNORMAL HIGH (ref 6–20)
CO2: 25 mmol/L (ref 22–32)
Calcium: 8.9 mg/dL (ref 8.9–10.3)
Chloride: 102 mmol/L (ref 98–111)
Creatinine, Ser: 0.63 mg/dL (ref 0.61–1.24)
GFR, Estimated: >60 mL/min (ref >60)
Glucose, Bld: 98 mg/dL (ref 70–99)
Potassium: 4.9 mmol/L (ref 3.5–5.1)
Sodium: 135 mmol/L (ref 135–145)
Total Bilirubin: 0.3 mg/dL (ref 0.3–1.2)
Total Protein: 7.8 g/dL (ref 6.5–8.1)

## 2021-08-07 LAB — GLUCOSE, CAPILLARY
Glucose-Capillary: 90 mg/dL (ref 70–99)
Glucose-Capillary: 110 mg/dL — ABNORMAL HIGH (ref 70–99)
Glucose-Capillary: 98 mg/dL (ref 70–99)

## 2021-08-07 LAB — CBC
HCT: 33.3 % — ABNORMAL LOW (ref 39.0–52.0)
Hemoglobin: 10.4 g/dL — ABNORMAL LOW (ref 13.0–17.0)
MCH: 28.4 pg (ref 26.0–34.0)
MCHC: 31.2 g/dL (ref 30.0–36.0)
MCV: 91 fL (ref 80.0–100.0)
Platelets: 563 10*3/uL — ABNORMAL HIGH (ref 150–400)
RBC: 3.66 MIL/uL — ABNORMAL LOW (ref 4.22–5.81)
RDW: 13.2 % (ref 11.5–15.5)
WBC: 7.9 10*3/uL (ref 4.0–10.5)
nRBC: 0 % (ref 0.0–0.2)

## 2021-08-07 LAB — PHOSPHORUS: Phosphorus: 3.8 mg/dL (ref 2.5–4.6)

## 2021-08-07 LAB — TRIGLYCERIDES: Triglycerides: 45 mg/dL (ref <150)

## 2021-08-07 LAB — MAGNESIUM: Magnesium: 2.2 mg/dL (ref 1.7–2.4)

## 2021-08-07 MED ORDER — ALTEPLASE 2 MG IJ SOLR
2.0000 mg | Freq: Once | INTRAMUSCULAR | Status: AC
  Administered 2021-08-07: 2 mg
  Filled 2021-08-07: qty 2

## 2021-08-07 MED ORDER — OLANZAPINE 10 MG IM SOLR
5.0000 mg | Freq: Every day | INTRAMUSCULAR | Status: DC
  Administered 2021-08-07: 5 mg via INTRAMUSCULAR
  Filled 2021-08-07: qty 10

## 2021-08-07 MED ORDER — TRAVASOL 10 % IV SOLN
INTRAVENOUS | Status: AC
  Filled 2021-08-07: qty 1254

## 2021-08-07 NOTE — Progress Notes (Signed)
19 Days Post-Op   Subjective/Chief Complaint: Abd pain stable. Reports a nonbloody stool yesterday. States he has a history of depression and he is homicidal. Denies SI.   Objective: Vital signs in last 24 hours: Temp:  [97.7 F (36.5 C)-97.9 F (36.6 C)] 97.8 F (36.6 C) (10/24 0804) Pulse Rate:  [79-88] 88 (10/24 0804) Resp:  [16-18] 18 (10/24 0804) BP: (87-96)/(55-60) 87/55 (10/24 0804) SpO2:  [98 %-100 %] 100 % (10/24 0804) Weight:  [58 kg] 58 kg (10/24 0525) Last BM Date: 08/06/21  Intake/Output from previous day: 10/23 0701 - 10/24 0700 In: 2114.5 [I.V.:1409.5; IV Piggyback:700] Out: 1270 [Urine:1000; Drains:270] Intake/Output this shift: Total I/O In: -  Out: 150 [Urine:150]  General appearance: alert and cooperative Resp: clear to auscultation bilaterally Cardio: regular rate and rhythm GI: appropriately tender with drains in place  Lab Results:  Recent Labs    08/06/21 0430 08/07/21 0359  WBC 10.8* 7.9  HGB 10.0* 10.4*  HCT 31.7* 33.3*  PLT 589* 563*   BMET Recent Labs    08/06/21 0430 08/07/21 0359  NA 134* 135  K 4.8 4.9  CL 102 102  CO2 23 25  GLUCOSE 104* 98  BUN 18 22*  CREATININE 0.60* 0.63  CALCIUM 8.7* 8.9   PT/INR No results for input(s): LABPROT, INR in the last 72 hours. ABG No results for input(s): PHART, HCO3 in the last 72 hours.  Invalid input(s): PCO2, PO2  Studies/Results: No results found.  Anti-infectives: Anti-infectives (From admission, onward)    Start     Dose/Rate Route Frequency Ordered Stop   08/03/21 1900  meropenem (MERREM) 1 g in sodium chloride 0.9 % 100 mL IVPB        1 g 200 mL/hr over 30 Minutes Intravenous Every 8 hours 08/03/21 1527     07/18/21 1000  piperacillin-tazobactam (ZOSYN) IVPB 3.375 g  Status:  Discontinued        3.375 g 12.5 mL/hr over 240 Minutes Intravenous Every 8 hours 07/18/21 0839 08/03/21 1527   07/12/21 1200  ceFAZolin (ANCEF) IVPB 2g/100 mL premix        2 g 200 mL/hr over  30 Minutes Intravenous To Short Stay 07/12/21 0843 07/12/21 1100       Assessment/Plan: s/p Procedure(s): EXPLORATORY LAPAROTOMY SMALL BOWEL REPAIR OF PERFORATION AND DRAINAGE OF INTERABDOMINAL CYST (N/A) Continue bowel rest and tpn for nutrition support POD#26 - S/P EXPLORATORY LAPAROTOMY; LYSIS OF ADHESIONS; SMALL INTESTINE REPAIR - 07/12/21 Dr. Sheliah Hatch for SBO POD#18- S/P LAPAROTOMY, REPAIR OF SMALL BOWEL PERFORATION, PLACEMENT 31F BLAKE DRAIN - 07/19/21 Dr Carolynne Edouard - high output fistula - On octreotide - pelvic abscess drain upsized by IR 10/11, RUQ drain placed by IR 10/18 - 450 mL and 40 mL yesterday - PICC/TPN. NGT out -WBC 7.9, drain culture with ESBL e coli resistant to zosyn, switched to merrem 10/20 - continue IV abx - OOB, mobilize with PT and staff  - incentive spirometry q 1h - continue PPI  - multimodal pain control - IV tylenol   FEN - NPO, PICC/TNA 9/30 >> VTE - lovenox (has been refusing) ID - ancef x1 dose 9/28 perioperatively, Zosyn 10/4 >> 10/20, Merrem 10/20>> Foley -  removed, voiding well   Bipolar disorder Homelessness Polysubstance abuse - psych consult today for assistance with mood stabilizing medications while NPO.   LOS: 28 days    Thomas [name] 08/07/2021

## 2021-08-07 NOTE — Progress Notes (Signed)
[facility] consult for tpa to be removed from purple lumen. Upon assessment, both the lumens on the PICC were infusing. Per dayshift RN, night shift RN Levi removed the tpa, assessed the line, and restarted the infusions. The [facility] RN received no blood return from either lumen during assessment. MD Hall was secure chatted and the dayshift RN was educated that [facility] manages care of this patient's line.  

## 2021-08-07 NOTE — Progress Notes (Signed)
PHARMACY - TOTAL PARENTERAL NUTRITION CONSULT NOTE  Indication: SBO/Prolonged ileus post-op  Patient Measurements: Height: 5\' 9"  (175.3 cm) Weight: 58 kg (127 lb 13.9 oz) IBW/kg (Calculated) : 70.7 TPN AdjBW (KG): 59 Body mass index is 18.88 kg/m. Usual Weight: 140 lbs  Assessment:  50 YOM presented on 07/10/2021 with abdominal pain, nausea, and vomiting for 3 days. PMH significant for polysubstance abuse, homelessness, bipolar disorder, SBO resulting in exlap, LOA and appendectomy at [facility] in May 2020. Patient reported one SBO since that was treated with NGT and improved. Pharmacy consulted to manage TPN for SBO, ileus.  Glucose / Insulin: no hx DM, A1c 5.3%. CBGs <150, off SSI Electrolytes: Na 135, K 4.9,  Mg 2.2  (goal K >=4, Mg >=2) CoCa 10.2  Renal: SCr <1 stable, BUN 22 Hepatic: LFTs wnl / Tbili 0.3 / TG WNL, albumin 1.9 Intake / Output; MIVF: UOP 0.7 ml/kg/hr, drain output/24h: 270 mL, NG out, LBM 10/21 ID: Meropenem D#5 for ESBL + proteus pelvic abscess. Afeb, WBC normlized  GI meds: PPI for suspected gastritis GI Imaging: 9/26 xray abd: concerning for SBO - 9/26 CT: high grade pSBO, suspected internal hernia vs. complex adhesions in RLQ - 9/27 xray abd: continued SBO - 9/28 xray abd: multiple gas filled dilated loops similar to CT - 10/4 CT abdomen/pelvis: large dumbbell-shaped pelvic abscess - 10/7 CT - no change pelvic abscess, no evidence of bowel obstruction - 10/17 Abd CT: Reduced pelvic abscess, new fluid collection in RUQ consistent w/ post-op abscess GI Surgeries / Procedures:  - 9/28 exlap, LOA, small intestine repair x2 - 10/4: LLQ drain catheter placement. - 10/5: ex lap with SBR for post-op bowel leak, drain placed -10/11: drain exchange - 10/18: Placement of drain for R-abd fluid collection  Central access: PICC placed 07/14/21 TPN start date: 07/14/21  Nutritional Goals:  RD recommendations increased 10/20 Estimated Needs Total Energy Estimated  Needs: 2300-2500 Total Protein Estimated Needs: 110-130 grams Total Fluid Estimated Needs: >/= 2.0 L  Current Nutrition:  TPN; NPO  Plan: - Decrease TPN cycle time to 16 hours - started cycling 10/22 - TPN will provide 125.4 g AA (63%), 68.4 g lLE (37%) , and 342 g CHO for total 2348 kCal, meeting 100% of patient needs - Electrolytes in TPN - no changes 10/23: Na 150 mEq/L, Decrease K to 40 mEq/L, Mg 8 mEq/L, Phos 12 mmol/L, Ca 5 mEq/L, Cl:Ac 1:1 - Cannot increase Mg in TPN any further due to concentrations in the bag. Will have to do outside of TPN when needed - MIVF - NS @ 50 cc/hr per surgery - Add standard MVI and trace elements to TPN - Monitor standard TPN labs Mon/Thurs and PRN  Thank you for allowing pharmacy to be a part of this patient's care.  11/23, PharmD., BCPS, BCCCP Clinical Pharmacist Please refer to [facility] for unit-specific pharmacist    **Pharmacist phone directory can now be found on [email or web address] ([email or web address]).  Listed under [facility].

## 2021-08-07 NOTE — Progress Notes (Signed)
Patient ID: Thomas [name], male   DOB: 04/29/1971, 50 y.o.   MRN: [id number]     Supervising Physician: Acquanetta Belling  Patient Status:  [facility] - In-pt  Chief Complaint: status post RUQ and LLQ drain placement  Subjective:  Pt lying in bed, dark room, NAD, reports tenderness at drain insertion sites   Allergies: Patient has no known allergies.  Medications: Prior to Admission medications   Medication Sig Start Date End Date Taking? Authorizing Provider  benztropine (COGENTIN) 1 MG tablet Take 1 tablet (1 mg total) by mouth 2 (two) times daily. Patient not taking: Reported on 07/11/2021 02/05/19   Malvin Johns, MD  divalproex (DEPAKOTE ER) 500 MG 24 hr tablet Take 1 tablet (500 mg total) by mouth 2 (two) times daily. Patient not taking: Reported on 07/11/2021 02/05/19   Malvin Johns, MD  fluPHENAZine (PROLIXIN) 5 MG tablet Take 1 tablet (5 mg total) by mouth 3 (three) times daily. Patient not taking: Reported on 07/11/2021 02/05/19   Malvin Johns, MD  gabapentin (NEURONTIN) 300 MG capsule Take 1 capsule (300 mg total) by mouth 3 (three) times daily. Patient not taking: No sig reported 02/05/19   Malvin Johns, MD  lidocaine (LIDODERM) 5 % Place 1 patch onto the skin daily. Remove & Discard patch within 12 hours or as directed by MD Patient not taking: Reported on 07/11/2021 05/04/19   Caccavale, Sophia, PA-C  naproxen (NAPROSYN) 500 MG tablet Take 1 tablet (500 mg total) by mouth 2 (two) times daily with a meal. Patient not taking: Reported on 07/11/2021 05/04/19   Caccavale, Sophia, PA-C     Vital Signs: BP (!) 87/55 (BP Location: Left Arm)   Pulse 88   Temp 97.8 F (36.6 C)   Resp 18   Ht 5\' 9"  (1.753 m)   Wt 127 lb 13.9 oz (58 kg)   SpO2 100%   BMI 18.88 kg/m   Physical Exam Vitals reviewed.  Cardiovascular:     Rate and Rhythm: Normal rate.  Pulmonary:     Effort: Pulmonary effort is normal.  Abdominal:     General: Abdomen is flat.     Tenderness: There is abdominal  tenderness in the right upper quadrant and left lower quadrant.     Comments: LLQ and RUQ drain sites are properly dressed and are c/d/I.  Pt expresses tenderness at insertion site RUQ: minimal bright bloody output LLQ: feculent output  Skin:    General: Skin is warm and dry.  Neurological:     Mental Status: He is alert.    Imaging: No results found.  Labs:  CBC: Recent Labs    08/04/21 0419 08/05/21 0356 08/06/21 0430 08/07/21 0359  WBC 12.9* 13.0* 10.8* 7.9  HGB 9.2* 9.7* 10.0* 10.4*  HCT 29.0* 29.8* 31.7* 33.3*  PLT 544* 558* 589* 563*    COAGS: Recent Labs    07/18/21 0823  INR 1.1    BMP: Recent Labs    08/04/21 0419 08/05/21 0356 08/06/21 0430 08/07/21 0359  NA 137 133* 134* 135  K 4.4 4.4 4.8 4.9  CL 106 103 102 102  CO2 25 25 23 25   GLUCOSE 110* 115* 104* 98  BUN 13 13 18  22*  CALCIUM 8.3* 8.4* 8.7* 8.9  CREATININE 0.62 0.64 0.60* 0.63  GFRNONAA >60 >60 >60 >60    LIVER FUNCTION TESTS: Recent Labs    07/27/21 0410 07/31/21 0333 08/03/21 0359 08/07/21 0359  BILITOT 0.5 0.2* 0.2* 0.3  AST 12* 10* 11*  17  ALT 14 12 13 20   ALKPHOS 119 128* 151* 111  PROT 7.2 7.5 7.4 7.8  ALBUMIN 1.7* 1.6* <1.5* 1.9*    Assessment and Plan:  S/p placement of RUQ and LLQ drains --draining well  RUQ: 20cc in last 24, LLQ: 250 in last 24 --wbc normalized --afebrile  ontinue TID flushes with 5 cc NS. Record output Q shift. Dressing changes QD or PRN if soiled.  Call IR APP or on call IR MD if difficulty flushing or sudden change in drain output.  Repeat imaging/possible drain injection once output < 10 mL/QD (excluding flush material.)  Discharge planning: Please contact IR APP or on call IR MD prior to patient d/c to ensure appropriate follow up plans are in place. Typically patient will follow up with IR clinic 10-14 days post d/c for repeat imaging/possible drain injection. IR scheduler will contact patient with date/time of appointment. Patient  will need to flush drain QD with 5 cc NS, record output QD, dressing changes every 2-3 days or earlier if soiled.   IR will continue to follow - please call with questions or concerns.    Electronically Signed: , PA 08/07/2021, 12:40 PM   I spent a total of 15 Minutes at the the patient's bedside AND on the patient's hospital floor or unit, greater than 50% of which was counseling/coordinating care for RLQ and LLQ drains
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
## 2021-08-08 DIAGNOSIS — F3132 Bipolar disorder, current episode depressed, moderate: Secondary | ICD-10-CM

## 2021-08-08 LAB — CBC
HCT: 31 % — ABNORMAL LOW (ref 39.0–52.0)
Hemoglobin: 9.7 g/dL — ABNORMAL LOW (ref 13.0–17.0)
MCH: 28.3 pg (ref 26.0–34.0)
MCHC: 31.3 g/dL (ref 30.0–36.0)
MCV: 90.4 fL (ref 80.0–100.0)
Platelets: 545 10*3/uL — ABNORMAL HIGH (ref 150–400)
RBC: 3.43 MIL/uL — ABNORMAL LOW (ref 4.22–5.81)
RDW: 13.4 % (ref 11.5–15.5)
WBC: 7.6 10*3/uL (ref 4.0–10.5)
nRBC: 0 % (ref 0.0–0.2)

## 2021-08-08 LAB — BASIC METABOLIC PANEL
Anion gap: 5 (ref 5–15)
BUN: 23 mg/dL — ABNORMAL HIGH (ref 6–20)
CO2: 28 mmol/L (ref 22–32)
Calcium: 8.9 mg/dL (ref 8.9–10.3)
Chloride: 103 mmol/L (ref 98–111)
Creatinine, Ser: 0.65 mg/dL (ref 0.61–1.24)
GFR, Estimated: >60 mL/min (ref >60)
Glucose, Bld: 89 mg/dL (ref 70–99)
Potassium: 4.5 mmol/L (ref 3.5–5.1)
Sodium: 136 mmol/L (ref 135–145)

## 2021-08-08 MED ORDER — TRAVASOL 10 % IV SOLN
INTRAVENOUS | Status: AC
  Filled 2021-08-08: qty 1254

## 2021-08-08 MED ORDER — MORPHINE SULFATE (PF) 2 MG/ML IV SOLN
2.0000 mg | INTRAVENOUS | Status: DC | PRN
  Administered 2021-08-08 – 2021-08-09 (×7): 4 mg via INTRAVENOUS
  Administered 2021-08-10 (×4): 2 mg via INTRAVENOUS
  Administered 2021-08-11: 4 mg via INTRAVENOUS
  Administered 2021-08-11: 2 mg via INTRAVENOUS
  Administered 2021-08-11: 4 mg via INTRAVENOUS
  Administered 2021-08-11: 2 mg via INTRAVENOUS
  Administered 2021-08-11 – 2021-08-12 (×3): 4 mg via INTRAVENOUS
  Filled 2021-08-08 (×2): qty 2
  Filled 2021-08-08 (×3): qty 1
  Filled 2021-08-08 (×4): qty 2
  Filled 2021-08-08: qty 1
  Filled 2021-08-08 (×4): qty 2
  Filled 2021-08-08 (×2): qty 1
  Filled 2021-08-08 (×2): qty 2
  Filled 2021-08-08 (×2): qty 1
  Filled 2021-08-08: qty 2

## 2021-08-08 MED ORDER — OLANZAPINE 10 MG IM SOLR
5.0000 mg | Freq: Every day | INTRAMUSCULAR | Status: DC
  Administered 2021-08-08 – 2021-08-10 (×3): 5 mg via INTRAMUSCULAR
  Filled 2021-08-08 (×5): qty 10

## 2021-08-08 MED ORDER — LORAZEPAM 2 MG/ML IJ SOLN
0.5000 mg | Freq: Four times a day (QID) | INTRAMUSCULAR | Status: DC | PRN
  Administered 2021-08-09: 0.5 mg via INTRAVENOUS
  Filled 2021-08-08: qty 1
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
## 2021-08-08 NOTE — Progress Notes (Signed)
Pt refused having his bed linens changed at this time. Linens are soiled with urine. Pt states that he just wants to rest and that his linens can be changed the next time he's due for pain meds. Pt did allow his blankets to be changed but not the remaining soiled linen. Will try again to change pt's soiled linens when next dose of pain meds are due per pt request.
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
## 2021-08-08 NOTE — Progress Notes (Signed)
20 Days Post-Op   Subjective/Chief Complaint: Abd pain stable. Reports a nonbloody stool yesterday. States he has a history of depression and he is homicidal. Denies SI.   Objective: Vital signs in last 24 hours: Temp:  [97.5 F (36.4 C)-98.4 F (36.9 C)] 98.4 F (36.9 C) (10/25 0817) Pulse Rate:  [75-85] 75 (10/25 0817) Resp:  [15-18] 18 (10/25 0817) BP: (93-99)/(60-68) 99/63 (10/25 0817) SpO2:  [98 %-100 %] 99 % (10/25 0817) Weight:  [56.1 kg] 56.1 kg (10/25 0502) Last BM Date: 08/06/21  Intake/Output from previous day: 10/24 0701 - 10/25 0700 In: 2916.5 [I.V.:2421.7; IV Piggyback:484.8] Out: 2045 [Urine:1640; Drains:405] Intake/Output this shift: Total I/O In: -  Out: 250 [Urine:250]  General appearance: alert and cooperative Resp: clear to auscultation bilaterally Cardio: regular rate and rhythm GI: appropriately tender with drains in place  Lab Results:  Recent Labs    08/07/21 0359 08/08/21 0509  WBC 7.9 7.6  HGB 10.4* 9.7*  HCT 33.3* 31.0*  PLT 563* 545*   BMET Recent Labs    08/07/21 0359 08/08/21 0509  NA 135 136  K 4.9 4.5  CL 102 103  CO2 25 28  GLUCOSE 98 89  BUN 22* 23*  CREATININE 0.63 0.65  CALCIUM 8.9 8.9   PT/INR No results for input(s): LABPROT, INR in the last 72 hours. ABG No results for input(s): PHART, HCO3 in the last 72 hours.  Invalid input(s): PCO2, PO2  Studies/Results: No results found.  Anti-infectives: Anti-infectives (From admission, onward)    Start     Dose/Rate Route Frequency Ordered Stop   08/03/21 1900  meropenem (MERREM) 1 g in sodium chloride 0.9 % 100 mL IVPB        1 g 200 mL/hr over 30 Minutes Intravenous Every 8 hours 08/03/21 1527     07/18/21 1000  piperacillin-tazobactam (ZOSYN) IVPB 3.375 g  Status:  Discontinued        3.375 g 12.5 mL/hr over 240 Minutes Intravenous Every 8 hours 07/18/21 0839 08/03/21 1527   07/12/21 1200  ceFAZolin (ANCEF) IVPB 2g/100 mL premix        2 g 200 mL/hr over 30  Minutes Intravenous To Short Stay 07/12/21 0843 07/12/21 1100       Assessment/Plan: s/p Procedure(s): EXPLORATORY LAPAROTOMY SMALL BOWEL REPAIR OF PERFORATION AND DRAINAGE OF INTERABDOMINAL CYST (N/A) Continue bowel rest and tpn for nutrition support POD#26 - S/P EXPLORATORY LAPAROTOMY; LYSIS OF ADHESIONS; SMALL INTESTINE REPAIR - 07/12/21 Dr. Sheliah Hatch for SBO POD#18- S/P LAPAROTOMY, REPAIR OF SMALL BOWEL PERFORATION, PLACEMENT 100F BLAKE DRAIN - 07/19/21 Dr Carolynne Edouard - high output fistula - On octreotide - pelvic abscess drain upsized by IR 10/11, RUQ drain placed by IR 10/18 - IR RUQ drain with 5 cc/24h, if remains this low then may be able to repeat CT/inject drain in a few days for possible removal - PICC/TPN. NGT out -WBC 7.9, drain culture with ESBL e coli resistant to zosyn, switched to merrem 10/20 - continue IV abx - OOB, mobilize with PT and staff  - incentive spirometry q 1h - continue PPI  - multimodal pain control - IV tylenol, PRN IV morphine, wean IV morphine   FEN - NPO, PICC/TNA 9/30 >> VTE - lovenox (has been refusing) ID - ancef x1 dose 9/28 perioperatively, Zosyn 10/4 >> 10/20, Merrem 10/20>>  (may be able to re-scan 10/27 or 10/28 to assess for source control as well as possible RUQ drain removal) Foley -  removed, voiding well  Bipolar disorder Homelessness Polysubstance abuse - psych consulted 10/24 and started IM Zyprexa, appreciate their assistance.    LOS: 29 days    Thomas [name] 08/08/2021

## 2021-08-09 LAB — BASIC METABOLIC PANEL
Anion gap: 4 — ABNORMAL LOW (ref 5–15)
BUN: 25 mg/dL — ABNORMAL HIGH (ref 6–20)
CO2: 28 mmol/L (ref 22–32)
Calcium: 8.8 mg/dL — ABNORMAL LOW (ref 8.9–10.3)
Chloride: 105 mmol/L (ref 98–111)
Creatinine, Ser: 0.65 mg/dL (ref 0.61–1.24)
GFR, Estimated: >60 mL/min (ref >60)
Glucose, Bld: 90 mg/dL (ref 70–99)
Potassium: 4.7 mmol/L (ref 3.5–5.1)
Sodium: 137 mmol/L (ref 135–145)

## 2021-08-09 MED ORDER — ACETAMINOPHEN 10 MG/ML IV SOLN
1000.0000 mg | Freq: Four times a day (QID) | INTRAVENOUS | Status: AC
  Administered 2021-08-09 – 2021-08-10 (×4): 1000 mg via INTRAVENOUS
  Filled 2021-08-09 (×4): qty 100

## 2021-08-09 MED ORDER — LORAZEPAM 2 MG/ML IJ SOLN
0.2500 mg | Freq: Four times a day (QID) | INTRAMUSCULAR | Status: DC | PRN
  Filled 2021-08-09: qty 1

## 2021-08-09 MED ORDER — THIAMINE HCL 100 MG/ML IJ SOLN
INTRAVENOUS | Status: AC
  Filled 2021-08-09: qty 1254

## 2021-08-09 MED ORDER — METHOCARBAMOL 1000 MG/10ML IJ SOLN
1000.0000 mg | Freq: Four times a day (QID) | INTRAVENOUS | Status: DC
  Administered 2021-08-09 – 2021-08-18 (×33): 1000 mg via INTRAVENOUS
  Filled 2021-08-09 (×4): qty 1000
  Filled 2021-08-09 (×2): qty 10
  Filled 2021-08-09 (×5): qty 1000
  Filled 2021-08-09: qty 10
  Filled 2021-08-09: qty 1000
  Filled 2021-08-09 (×2): qty 10
  Filled 2021-08-09: qty 1000
  Filled 2021-08-09: qty 10
  Filled 2021-08-09: qty 1000
  Filled 2021-08-09: qty 10
  Filled 2021-08-09 (×7): qty 1000
  Filled 2021-08-09: qty 10
  Filled 2021-08-09: qty 1000
  Filled 2021-08-09 (×4): qty 10
  Filled 2021-08-09: qty 1000
  Filled 2021-08-09: qty 10
  Filled 2021-08-09 (×4): qty 1000
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
## 2021-08-09 NOTE — Progress Notes (Signed)
Occupational Therapy Treatment Patient Details Name: Thomas [name] MRN: [id number] DOB: November 18, 1970 Today's Date: 08/09/2021   History of present illness 50 y/o male admitted 9/26 secondary to nausea/vomiting, and abdominal pain. Found to have SBO and had NG tube placed. Pt is s/p lysis of adhesions and small intestine repaire on 9/28. Pt with LLQ abscess and had percutaneous drain placement on 10/4. Pt found to have post op bowel leak and is s/p repair of small bowel perforation on 10/5. Drain exchange 07/25/21 PMH includes bipolar disorder, tobacco use and substance abuse.   OT comments  Pt continues with self-limiting behaviors. Took pivotal steps to sit up in recliner, cues for safety d/t impulsivity and decreased regard for lines and drains. Vehemently declined any further mobilizing. Once OT left room pt returned to bed, setting off chair alarm, recliner foot rest still elevated, and left IV pole behind. OT reentered room to manage lines and set bed alarm. D/c recommendation as below.    Recommendations for follow up therapy are one component of a multi-disciplinary discharge planning process, led by the attending physician.  Recommendations may be updated based on patient status, additional functional criteria and insurance authorization.    Follow Up Recommendations  No OT follow up    Assistance Recommended at Discharge Intermittent Supervision/Assistance  Equipment Recommendations  None recommended by OT    Recommendations for Other Services      Precautions / Restrictions Precautions Precautions: Other (comment);Fall Precaution Comments: abdominal drain to foley bag, NG tube Restrictions Weight Bearing Restrictions: No Other Position/Activity Restrictions: impulsive       Mobility Bed Mobility Overal bed mobility: Needs Assistance   Rolling: Supervision Sidelying to sit: Supervision;HOB elevated       General bed mobility comments: significant increased time,  heavy reliance on railing    Transfers Overall transfer level: Needs assistance Equipment used: Rolling walker (2 wheels) Transfers: Sit to/from [employer] Transfers Sit to Stand: Min guard Stand pivot transfers: Min guard         General transfer comment: cues for safety, impulsive.     Balance Overall balance assessment: Needs assistance Sitting-balance support: No upper extremity supported;Feet supported Sitting balance-Leahy Scale: Fair     Standing balance support: Bilateral upper extremity supported Standing balance-Leahy Scale: Poor Standing balance comment: reliant on BUE support of RW                           ADL either performed or assessed with clinical judgement   ADL Overall ADL's : Needs assistance/impaired Eating/Feeding: NPO                       Toilet Transfer: Min guard;Stand-pivot;Rolling walker (2 wheels) Toilet Transfer Details (indicate cue type and reason): cues for safety, impulsive, disregard for lines           General ADL Comments: bed mobility and pivotal steps to recliner     Vision       Perception     Praxis      Cognition Arousal/Alertness: Awake/alert Behavior During Therapy: Agitated;Impulsive (angry) Overall Cognitive Status: No family/caregiver present to determine baseline cognitive functioning                                 General Comments: angry demeanor. Not interested in working with therapy. OOB to recliner for 10 minutes d/t MD's conversation  with pt earlier. As soon as OT stepped out of room pt set off chair alarm (unsafely) getting himself back to bed. Left IV behind.          Exercises     Shoulder Instructions       General Comments      Pertinent Vitals/ Pain       Pain Assessment: Faces Faces Pain Scale: Hurts even more Breathing: normal Pain Location: abdomen Pain Descriptors / Indicators: Aching Pain Intervention(s): Premedicated before  session;Monitored during session;Limited activity within patient's tolerance  Home Living                                          Prior Functioning/Environment              Frequency  Min 2X/week        Progress Toward Goals  OT Goals(current goals can now be found in the care plan section)  Progress towards OT goals: Progressing toward goals  Acute Rehab OT Goals OT Goal Formulation: With patient Time For Goal Achievement: 08/23/21 Potential to Achieve Goals: Good ADL Goals Pt Will Perform Upper Body Dressing: with set-up;with supervision;sitting Pt Will Perform Lower Body Dressing: with modified independence;sit to/from stand Pt Will Transfer to Toilet: with modified independence;ambulating  Plan Discharge plan needs to be updated    Co-evaluation                 AM-PAC OT "6 Clicks" Daily Activity     Outcome Measure   Help from another person eating meals?: Total Help from another person taking care of personal grooming?: A Little Help from another person toileting, which includes using toliet, bedpan, or urinal?: A Little Help from another person bathing (including washing, rinsing, drying)?: A Little Help from another person to put on and taking off regular upper body clothing?: A Little Help from another person to put on and taking off regular lower body clothing?: A Little 6 Click Score: 16    End of Session Equipment Utilized During Treatment: Rolling walker (2 wheels)  OT Visit Diagnosis: Unsteadiness on feet (R26.81);Other abnormalities of gait and mobility (R26.89);Muscle weakness (generalized) (M62.81);Pain   Activity Tolerance Patient tolerated treatment well;Other (comment) (self-limiting)   Patient Left in bed;with call bell/phone within reach;with bed alarm set   Nurse Communication          Time: 6213-0865 OT Time Calculation (min): 22 min  Charges: OT General Charges $OT Visit: 1 Visit OT Treatments $Self  Care/Home Management : 8-22 mins  Raynald Kemp, OT Acute Rehabilitation Services Pager: [phone] Office: [phone]   Pilar Grammes 08/09/2021, 2:03 PM

## 2021-08-09 NOTE — Progress Notes (Addendum)
21 Days Post-Op   Subjective/Chief Complaint: Abd pain stable. Did not get out of bed yesterday. States he is agreeable to work with PT if I pre-medicate with pain meds.  Objective: Vital signs in last 24 hours: Temp:  [97.9 F (36.6 C)-98.6 F (37 C)] 98.5 F (36.9 C) (10/26 0758) Pulse Rate:  [76-84] 84 (10/26 0758) Resp:  [16-18] 16 (10/26 0758) BP: (92-109)/(56-71) 96/56 (10/26 0758) SpO2:  [97 %-100 %] 97 % (10/26 0758) Last BM Date: 08/06/21  Intake/Output from previous day: 10/25 0701 - 10/26 0700 In: 2415.5 [I.V.:1615.5; IV Piggyback:800] Out: 1150 [Urine:1150] Intake/Output this shift: No intake/output data recorded.  General appearance: alert and cooperative Resp: clear to auscultation bilaterally Cardio: regular rate and rhythm GI: appropriately tender with drains in place; dressing changed by me -- midline wound healing nicely - 100% granulation tissue. LLQ drain sponge changed, no leakage around tube.  Lab Results:  Recent Labs    08/07/21 0359 08/08/21 0509  WBC 7.9 7.6  HGB 10.4* 9.7*  HCT 33.3* 31.0*  PLT 563* 545*   BMET Recent Labs    08/07/21 0359 08/08/21 0509  NA 135 136  K 4.9 4.5  CL 102 103  CO2 25 28  GLUCOSE 98 89  BUN 22* 23*  CREATININE 0.63 0.65  CALCIUM 8.9 8.9   PT/INR No results for input(s): LABPROT, INR in the last 72 hours. ABG No results for input(s): PHART, HCO3 in the last 72 hours.  Invalid input(s): PCO2, PO2  Studies/Results: No results found.  Anti-infectives: Anti-infectives (From admission, onward)    Start     Dose/Rate Route Frequency Ordered Stop   08/03/21 1900  meropenem (MERREM) 1 g in sodium chloride 0.9 % 100 mL IVPB        1 g 200 mL/hr over 30 Minutes Intravenous Every 8 hours 08/03/21 1527     07/18/21 1000  piperacillin-tazobactam (ZOSYN) IVPB 3.375 g  Status:  Discontinued        3.375 g 12.5 mL/hr over 240 Minutes Intravenous Every 8 hours 07/18/21 0839 08/03/21 1527   07/12/21 1200   ceFAZolin (ANCEF) IVPB 2g/100 mL premix        2 g 200 mL/hr over 30 Minutes Intravenous To Short Stay 07/12/21 0843 07/12/21 1100       Assessment/Plan: s/p Procedure(s): EXPLORATORY LAPAROTOMY SMALL BOWEL REPAIR OF PERFORATION AND DRAINAGE OF INTERABDOMINAL CYST (N/A) Continue bowel rest and tpn for nutrition support POD#27 - S/P EXPLORATORY LAPAROTOMY; LYSIS OF ADHESIONS; SMALL INTESTINE REPAIR - 07/12/21 Dr. Sheliah Hatch for SBO POD#19- S/P LAPAROTOMY, REPAIR OF SMALL BOWEL PERFORATION, PLACEMENT 47F BLAKE DRAIN - 07/19/21 Dr Carolynne Edouard - high output fistula - On octreotide; output seems to be decreasing - spoke with RN about continuing strict output. Output was around 400 cc 10/24-10/25. May be able to trial clears if continues to decrease. - pelvic abscess drain upsized by IR 10/11, RUQ drain placed by IR 10/18 - IR RUQ drain with 5-10 cc/24h, if remains this low then may be able to repeat CT/inject drain in a few days for possible removal; would like to repeat CT tomorrow 10/27, will discuss with IR. - PICC/TPN. NGT out -WBC 7.9, drain culture with ESBL e coli resistant to zosyn, switched to merrem 10/20 - continue IV abx - OOB, mobilize with PT and staff  - incentive spirometry q 1h - continue PPI  - multimodal pain control - IV tylenol, PRN IV morphine, wean IV morphine   FEN - NPO, PICC/TNA  9/30 >>  VTE - lovenox (has been refusing) ID - ancef x1 dose 9/28 perioperatively, Zosyn 10/4 >> 10/20, Merrem 10/20>>  (re-scan 10/27 to assess for source control as well as possible RUQ drain removal) Foley -  removed, voiding well   Bipolar disorder Homelessness Polysubstance abuse - psych consulted 10/24 and started IM Zyprexa, appreciate their assistance.    LOS: 30 days    Adam Phenix 08/09/2021
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
## 2021-08-10 ENCOUNTER — Inpatient Hospital Stay (HOSPITAL_COMMUNITY): Payer: Self-pay

## 2021-08-10 LAB — GLUCOSE, CAPILLARY
Glucose-Capillary: 106 mg/dL — ABNORMAL HIGH (ref 70–99)
Glucose-Capillary: 90 mg/dL (ref 70–99)
Glucose-Capillary: 99 mg/dL (ref 70–99)
Glucose-Capillary: 86 mg/dL (ref 70–99)
Glucose-Capillary: 101 mg/dL — ABNORMAL HIGH (ref 70–99)

## 2021-08-10 LAB — COMPREHENSIVE METABOLIC PANEL
ALT: 22 U/L (ref 0–44)
AST: 21 U/L (ref 15–41)
Albumin: 2.1 g/dL — ABNORMAL LOW (ref 3.5–5.0)
Alkaline Phosphatase: 101 U/L (ref 38–126)
Anion gap: 5 (ref 5–15)
BUN: 22 mg/dL — ABNORMAL HIGH (ref 6–20)
CO2: 28 mmol/L (ref 22–32)
Calcium: 9.1 mg/dL (ref 8.9–10.3)
Chloride: 103 mmol/L (ref 98–111)
Creatinine, Ser: 0.6 mg/dL — ABNORMAL LOW (ref 0.61–1.24)
GFR, Estimated: >60 mL/min (ref >60)
Glucose, Bld: 97 mg/dL (ref 70–99)
Potassium: 4.3 mmol/L (ref 3.5–5.1)
Sodium: 136 mmol/L (ref 135–145)
Total Bilirubin: 0.4 mg/dL (ref 0.3–1.2)
Total Protein: 8 g/dL (ref 6.5–8.1)

## 2021-08-10 LAB — PHOSPHORUS: Phosphorus: 3.4 mg/dL (ref 2.5–4.6)

## 2021-08-10 LAB — MAGNESIUM: Magnesium: 2.1 mg/dL (ref 1.7–2.4)

## 2021-08-10 MED ORDER — TRAVASOL 10 % IV SOLN
INTRAVENOUS | Status: AC
  Filled 2021-08-10: qty 1254

## 2021-08-10 MED ORDER — IOHEXOL 350 MG/ML SOLN
100.0000 mL | Freq: Once | INTRAVENOUS | Status: AC | PRN
  Administered 2021-08-10: 100 mL via INTRAVENOUS

## 2021-08-10 MED ORDER — IOHEXOL 9 MG/ML PO SOLN
ORAL | Status: AC
  Administered 2021-08-10: 500 mL
  Filled 2021-08-10: qty 1000

## 2021-08-10 NOTE — Evaluation (Signed)
Physical Therapy Re-Evaluation Patient Details Name: Thomas [name] MRN: [id number] DOB: 1971/06/28 Today's Date: 08/10/2021  History of Present Illness  50 y/o male admitted 9/26 secondary to nausea/vomiting, and abdominal pain. Found to have SBO and had NG tube placed. Pt is s/p lysis of adhesions and small intestine repaire on 9/28. Pt with LLQ abscess and had percutaneous drain placement on 10/4. Pt found to have post op bowel leak and is s/p repair of small bowel perforation on 10/5. Pelvic abscess drain upsized by IR 10/11, RUQ drain placed by IR 10/18.  PMH includes bipolar disorder, tobacco use and substance abuse.  Clinical Impression  Patient presents now willing to walk, but very agitated when PT corrects his gait.  He ambulated in hallway with RW and min A and left in chair with water to wash up, but refusing assistance.  Patient likely to progress once he is willing to participate and not need follow up PT at d/c.  Will continue to follow acutely.        Recommendations for follow up therapy are one component of a multi-disciplinary discharge planning process, led by the attending physician.  Recommendations may be updated based on patient status, additional functional criteria and insurance authorization.  Follow Up Recommendations No PT follow up    Assistance Recommended at Discharge Intermittent Supervision/Assistance  Functional Status Assessment Patient has had a recent decline in their functional status and demonstrates the ability to make significant improvements in function in a reasonable and predictable amount of time.  Equipment Recommendations  Rolling walker (2 wheels);3in1 (PT)    Recommendations for Other Services       Precautions / Restrictions Precautions Precautions: Fall Precaution Comments: drains x 2 Restrictions Other Position/Activity Restrictions: impulsive      Mobility  Bed Mobility Overal bed mobility: Needs Assistance         Sit to  supine: Supervision;HOB elevated   General bed mobility comments: angrily getting up without help, assist for IV drain    Transfers   Equipment used: Rolling walker (2 wheels) Transfers: Sit to/from Stand Sit to Stand: Min guard           General transfer comment: pulling up on walker so assist for safety and line management    Ambulation/Gait Ambulation/Gait assistance: Min assist Gait Distance (Feet): 150 Feet Assistive device: Rolling walker (2 wheels) Gait Pattern/deviations: Step-through pattern;Decreased stride length;Trunk flexed;Wide base of support     General Gait Details: pushing walker out and staying bent over, assist for keeping walker close and cues for posture, though pt reports won't let me tell him what to do  Stairs            Wheelchair Mobility    Modified Rankin (Stroke Patients Only)       Balance Overall balance assessment: Needs assistance   Sitting balance-Leahy Scale: Fair     Standing balance support: Bilateral upper extremity supported;Reliant on assistive device for balance Standing balance-Leahy Scale: Poor                               Pertinent Vitals/Pain Faces Pain Scale: Hurts even more Pain Location: abdomen Pain Descriptors / Indicators: Grimacing;Guarding;Discomfort Pain Intervention(s): Monitored during session;Repositioned;Other (comment) (asked RN to premedicate)    Home Living Family/patient expects to be discharged to:: Private residence Living Arrangements: Other relatives (neice) Available Help at Discharge: Family;Available PRN/intermittently Type of Home: House Home Access: Stairs to enter  Entrance Stairs-Number of Steps: 3-4   Home Layout: One level Home Equipment: None Additional Comments: information from chart review    Prior Function                       Hand Dominance        Extremity/Trunk Assessment   Upper Extremity Assessment Upper Extremity Assessment:  Generalized weakness    Lower Extremity Assessment Lower Extremity Assessment: Generalized weakness    Cervical / Trunk Assessment Cervical / Trunk Exceptions: flexed posture  Communication   Communication: No difficulties  Cognition Arousal/Alertness: Awake/alert Behavior During Therapy: Agitated;Impulsive Overall Cognitive Status: No family/caregiver present to determine baseline cognitive functioning                                 General Comments: cursing as we walked when I asked him to straighten up and look forward; called his friend in once in the room to complain        General Comments General comments (skin integrity, edema, etc.): patient refusing to let PT help him wash up or change gown, left sitting up with wash basin, cloths, towel and clean gown, NT in the room.  Lost dressings during ambulation RN aware    Exercises     Assessment/Plan    PT Assessment Patient needs continued PT services  PT Problem List Decreased strength;Decreased mobility;Decreased safety awareness;Decreased knowledge of use of DME;Decreased balance;Decreased activity tolerance;Pain       PT Treatment Interventions DME instruction;Gait training;Therapeutic activities;Functional mobility training;Balance training;Therapeutic exercise;Patient/family education;Stair training    PT Goals (Current goals can be found in the Care Plan section)  Acute Rehab PT Goals Patient Stated Goal: none stated PT Goal Formulation: Patient unable to participate in goal setting Time For Goal Achievement: 08/24/21 Potential to Achieve Goals: Good    Frequency Min 3X/week   Barriers to discharge        Co-evaluation               AM-PAC PT "6 Clicks" Mobility  Outcome Measure Help needed turning from your back to your side while in a flat bed without using bedrails?: None Help needed moving from lying on your back to sitting on the side of a flat bed without using bedrails?:  None Help needed moving to and from a bed to a chair (including a wheelchair)?: A Little Help needed standing up from a chair using your arms (e.g., wheelchair or bedside chair)?: A Little Help needed to walk in hospital room?: A Little Help needed climbing 3-5 steps with a railing? : A Little 6 Click Score: 20    End of Session   Activity Tolerance: Treatment limited secondary to agitation Patient left: in chair;with call bell/phone within reach;with nursing/sitter in room   PT Visit Diagnosis: Other abnormalities of gait and mobility (R26.89);Difficulty in walking, not elsewhere classified (R26.2);Muscle weakness (generalized) (M62.81)    Time: 1610-9604 PT Time Calculation (min) (ACUTE ONLY): 18 min   Charges:   PT Evaluation $PT Re-evaluation: 1 Re-eval          Sheran Lawless, PT Acute Rehabilitation Services [phone] Office:[phone] 08/10/2021   Elray Mcgregor 08/10/2021, 4:13 PM

## 2021-08-10 NOTE — Progress Notes (Addendum)
22 Days Post-Op   Subjective/Chief Complaint: NAEO. Irritable this morning. Worked with OT yesterday.   Objective: Vital signs in last 24 hours: Temp:  [97.8 F (36.6 C)-98.1 F (36.7 C)] 98.1 F (36.7 C) (10/27 0841) Pulse Rate:  [65-80] 73 (10/27 0841) Resp:  [16-19] 19 (10/27 0841) BP: (95-100)/(62-77) 96/71 (10/27 0841) SpO2:  [96 %-100 %] 100 % (10/27 0841) Last BM Date: 08/06/21  Intake/Output from previous day: 10/26 0701 - 10/27 0700 In: 2730.5 [P.O.:480; I.V.:1340.5; IV Piggyback:900] Out: 1540 [Urine:1475; Drains:65] Intake/Output this shift: Total I/O In: -  Out: 400 [Urine:400]  General appearance: alert and cooperative Resp: clear to auscultation bilaterally Cardio: regular rate and rhythm GI: appropriately tender with drains in place; dressing changed by me -- midline wound healing nicely - 100% granulation tissue. LLQ drain site c/d/i  Lab Results:  Recent Labs    08/08/21 0509  WBC 7.6  HGB 9.7*  HCT 31.0*  PLT 545*   BMET Recent Labs    08/09/21 1011 08/10/21 0352  NA 137 136  K 4.7 4.3  CL 105 103  CO2 28 28  GLUCOSE 90 97  BUN 25* 22*  CREATININE 0.65 0.60*  CALCIUM 8.8* 9.1   PT/INR No results for input(s): LABPROT, INR in the last 72 hours. ABG No results for input(s): PHART, HCO3 in the last 72 hours.  Invalid input(s): PCO2, PO2  Studies/Results: No results found.  Anti-infectives: Anti-infectives (From admission, onward)    Start     Dose/Rate Route Frequency Ordered Stop   08/03/21 1900  meropenem (MERREM) 1 g in sodium chloride 0.9 % 100 mL IVPB        1 g 200 mL/hr over 30 Minutes Intravenous Every 8 hours 08/03/21 1527     07/18/21 1000  piperacillin-tazobactam (ZOSYN) IVPB 3.375 g  Status:  Discontinued        3.375 g 12.5 mL/hr over 240 Minutes Intravenous Every 8 hours 07/18/21 0839 08/03/21 1527   07/12/21 1200  ceFAZolin (ANCEF) IVPB 2g/100 mL premix        2 g 200 mL/hr over 30 Minutes Intravenous To Short  Stay 07/12/21 0843 07/12/21 1100       Assessment/Plan: s/p Procedure(s): EXPLORATORY LAPAROTOMY SMALL BOWEL REPAIR OF PERFORATION AND DRAINAGE OF INTERABDOMINAL CYST (N/A) Continue bowel rest and tpn for nutrition support POD#27 - S/P EXPLORATORY LAPAROTOMY; LYSIS OF ADHESIONS; SMALL INTESTINE REPAIR - 07/12/21 Dr. Sheliah Hatch for SBO POD#19- S/P LAPAROTOMY, REPAIR OF SMALL BOWEL PERFORATION, PLACEMENT 87F BLAKE DRAIN - 07/19/21 Dr Carolynne Edouard - high output fistula - On octreotide; output seems to be decreasing - spoke with RN about continuing strict output. Output was around 400 cc 10/24-10/25. 50 cc documented last 24 hours. May be able to trial clears if continues to decrease. - pelvic abscess drain upsized by IR 10/11, RUQ drain placed by IR 10/18 - IR RUQ drain with 5-15 cc/24h - PICC/TPN. NGT out -WBC 7.9, drain culture with ESBL e coli resistant to zosyn, switched to merrem 10/20 - continue IV abx - OOB, mobilize with PT and staff  - incentive spirometry q 1h - continue PPI  - multimodal pain control - IV tylenol, PRN IV morphine, wean IV morphine   FEN - NPO, PICC/TNA 9/30 >>  VTE - lovenox (has been refusing) ID - ancef x1 dose 9/28 perioperatively, Zosyn 10/4 >> 10/20, Merrem 10/20>>  (re-scan today 10/27 to assess for source control as well as possible RUQ drain removal) Foley -  removed, voiding  well   Bipolar disorder Homelessness Polysubstance abuse - psych consulted 10/24 and started IM Zyprexa, appreciate their assistance.    LOS: 31 days    Adam Phenix 08/10/2021   I personally saw the patient and performed a substantive portion of this encounter, including a complete performance of at least one of the key components (MDM, Hx and/or Exam), in conjunction with the Advanced Practice Provider Hosie Spangle, PA-C.  He is doing well.  No major complaints today.  Abdomen is soft, appropriately tender.  Midline wound looks great, very shallow granulating tissue.   Right upper quadrant drain with serous output, left lower quadrant drain with succus, only 65 recorded in the last 24 hours, there is about 200 in the bag currently.  CT today and possibly removal of the right upper quadrant drain pending result.  If his lower drain is truly this low output, can likely begin a diet and continue to monitor output.

## 2021-08-10 NOTE — Progress Notes (Signed)
Primary RN notified that patient's LLQ drain had become dislodged. Patient found with LLQ drain removed. Dry dressing applied and Delton Coombes, PA notified. Primary RN contacted IR/Radiology to see if it can be replaced. Will continue to monitor.

## 2021-08-10 NOTE — Progress Notes (Signed)
Place colostomy bag over the drain whole on LLQ, to catch any drainage that may appear. The colostomy bag is close to the midline incision. IR to see patient in AM. Will continue to monitor site and patient.
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
## 2021-08-11 LAB — GLUCOSE, CAPILLARY
Glucose-Capillary: 108 mg/dL — ABNORMAL HIGH (ref 70–99)
Glucose-Capillary: 76 mg/dL (ref 70–99)
Glucose-Capillary: 91 mg/dL (ref 70–99)
Glucose-Capillary: 93 mg/dL (ref 70–99)

## 2021-08-11 MED ORDER — CHROMIC CHLORIDE 40 MCG/10ML IV SOLN
INTRAVENOUS | Status: AC
  Filled 2021-08-11: qty 1254

## 2021-08-11 MED ORDER — OLANZAPINE 10 MG IM SOLR
7.5000 mg | Freq: Every day | INTRAMUSCULAR | Status: DC
  Administered 2021-08-11 – 2021-08-15 (×5): 7.5 mg via INTRAMUSCULAR
  Filled 2021-08-11 (×5): qty 10
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
## 2021-08-11 NOTE — Progress Notes (Addendum)
23 Days Post-Op   Subjective/Chief Complaint: NAEO.denies drainage from LLQ abdominal wall. Asking when he can eat/drink - feels hungry. No other new complaints.  Objective: Vital signs in last 24 hours: Temp:  [97.7 F (36.5 C)-98.1 F (36.7 C)] 98.1 F (36.7 C) (10/28 0842) Pulse Rate:  [71-84] 71 (10/28 0842) Resp:  [17-18] 18 (10/28 0842) BP: (96-98)/(61-68) 98/61 (10/28 0842) SpO2:  [98 %-99 %] 99 % (10/28 0842) Last BM Date: 08/06/21  Intake/Output from previous day: 10/27 0701 - 10/28 0700 In: 538.6 [I.V.:38.6; IV Piggyback:500] Out: 1675 [Urine:1675] Intake/Output this shift: No intake/output data recorded.  General appearance: alert and cooperative Resp: clear to auscultation bilaterally Cardio: regular rate and rhythm GI: appropriately tender. RUQ drain in place, clean dressing over LLQ wound where previous drain was.    Lab Results:   BMET Recent Labs    08/09/21 1011 08/10/21 0352  NA 137 136  K 4.7 4.3  CL 105 103  CO2 28 28  GLUCOSE 90 97  BUN 25* 22*  CREATININE 0.65 0.60*  CALCIUM 8.8* 9.1   Studies/Results: CT ABDOMEN PELVIS W CONTRAST  Result Date: 08/10/2021 CLINICAL DATA:  Abdominal pain and emesis. Status post small bowel repair with subsequent leak and known fistula through the left lower quadrant drain. EXAM: CT ABDOMEN AND PELVIS WITH CONTRAST TECHNIQUE: Multidetector CT imaging of the abdomen and pelvis was performed using the standard protocol following bolus administration of intravenous contrast. CONTRAST:  OMNIPAQUE IOHEXOL 350 MG/ML SOLN COMPARISON:  CT abdomen and pelvis 07/31/2021. FINDINGS: Lower chest: There is atelectasis in the right lower lobe which has decreased. Hepatobiliary: There is a hypodensity in the dome of the liver which is too small to characterize image 3/14. Liver, gallbladder and bile ducts are otherwise within normal limits. Pancreas: Unremarkable. No pancreatic ductal dilatation or surrounding inflammatory  changes. Spleen: Normal in size without focal abnormality. Adrenals/Urinary Tract: The kidneys and adrenal glands are within normal limits. There is a small amount of air in the bladder similar to the prior study. Stomach/Bowel: The oral contrast reaches the rectum. There are borderline dilated small bowel loops in the lower abdomen which may represent mild ileus. There is no definite obstruction. There is no focal bowel wall thickening or inflammation. The appendix is not seen. Stomach is within normal limits. Vascular/Lymphatic: Aortic atherosclerosis. No enlarged abdominal or pelvic lymph nodes. Reproductive: Prostate is unremarkable. Other: Left pelvic drainage catheter is unchanged in position. Previously identified fluid collection in the central pelvis is no longer seen. There is a new percutaneous pigtail catheter in the right upper quadrant draining previously identified air-fluid collection in the right upper quadrant. This collection has nearly completely resolved in the interval. Small collection persists measuring 2.0 by 0.5 by 0.9 cm image 3/47. There is intramuscular edema and a small amount of air in the abdominal wall at the level of this catheter without discrete measurable fluid collection. There is no gross free intraperitoneal air or ascites. Musculoskeletal: No acute or significant osseous findings. IMPRESSION: 1. Borderline dilated pelvic small bowel loops favored as ileus. There is no definite bowel obstruction. Oral contrast reaches the rectum. 2. Pelvic fluid collection is no longer seen. Left pelvic drain in place. 3. New right upper quadrant percutaneous drainage catheter in place. There has been interval near complete resolution of right upper quadrant fluid collection. There is abdominal wall edema and air at the level of the catheter. Infection is not excluded at this level. No abscess in the abdominal  wall. 4.  Aortic Atherosclerosis (ICD10-I70.0). Electronically Signed   By: Darliss Cheney M.D.   On: 08/10/2021 15:16    Anti-infectives: Anti-infectives (From admission, onward)    Start     Dose/Rate Route Frequency Ordered Stop   08/03/21 1900  meropenem (MERREM) 1 g in sodium chloride 0.9 % 100 mL IVPB        1 g 200 mL/hr over 30 Minutes Intravenous Every 8 hours 08/03/21 1527     07/18/21 1000  piperacillin-tazobactam (ZOSYN) IVPB 3.375 g  Status:  Discontinued        3.375 g 12.5 mL/hr over 240 Minutes Intravenous Every 8 hours 07/18/21 0839 08/03/21 1527   07/12/21 1200  ceFAZolin (ANCEF) IVPB 2g/100 mL premix        2 g 200 mL/hr over 30 Minutes Intravenous To Short Stay 07/12/21 0843 07/12/21 1100       Assessment/Plan: s/p Procedure(s): EXPLORATORY LAPAROTOMY SMALL BOWEL REPAIR OF PERFORATION AND DRAINAGE OF INTERABDOMINAL CYST (N/A) Continue bowel rest and tpn for nutrition support POD#27 - S/P EXPLORATORY LAPAROTOMY; LYSIS OF ADHESIONS; SMALL INTESTINE REPAIR - 07/12/21 Dr. Sheliah Hatch for SBO POD#19- S/P LAPAROTOMY, REPAIR OF SMALL BOWEL PERFORATION, PLACEMENT 92F BLAKE DRAIN - 07/19/21 Dr Carolynne Edouard - high output fistula - On octreotide (patient has been refusing this medication so D/C-ed today 10/28); output seems to be decreasing - continue strict monitoring of output. 75 cc documented last 24 hours. May be able to trial clears if continues to decrease. - pelvic abscess drain upsized by IR 10/11, RUQ drain placed by IR 10/18 - IR RUQ drain with 5-15 cc/24h; drain culture with ESBL e coli resistant to zosyn, switched to merrem 10/20 - repeat CT A/P 10/27 w/ resolution of pelvic fluid collection and near complete resolution RUQ collection, unfortunately LLQ drain became dislodged and removed after this scan, will ask IR to evaluate for drain replacement along with possible removal of RUQ drain.  - PICC/TPN. NGT out - continue IV abx - OOB, mobilize with PT and staff  - incentive spirometry q 1h - continue PPI  - multimodal pain control - IV tylenol, PRN IV  morphine, wean IV morphine   FEN - NPO, PICC/TNA 9/30 >>  VTE - lovenox (has been refusing) ID - ancef x1 dose 9/28 perioperatively, Zosyn 10/4 >> 10/20, Merrem 10/20>>  (scan 10/27 w/ resolution of fluid collections, will discuss abx stop date with MD)  Foley -  removed, voiding well Dispo - med-surg, TPN, IR consult for LLQ drain replacement/RUQ drain removal, possible trial of clears today or tomorrow. Stop octreotide as pt refusing.   Bipolar disorder Homelessness Polysubstance abuse - psych consulted 10/24 and started IM Zyprexa, appreciate their assistance.    LOS: 32 days    Adam Phenix 08/11/2021   I personally saw the patient and performed a substantive portion of this encounter, including a complete performance of at least one of the key components (MDM, Hx and/or Exam), in conjunction with the Advanced Practice Provider Hosie Spangle, PA-C. CT scan yesterday with improvement in all fluid collections.  Suspect that the right upper quadrant drain can be removed.  Unfortunately the patient pulled the fistula drain (LLQ) yesterday afternoon.  IR consulted for possible replacement.

## 2021-08-11 NOTE — Consult Note (Signed)
WOC Nurse Consult Note: Patient receiving care in [facility] [phone] Reason for Consult: colostomy pouch over previous drain site - fistula Incisional wound just over the pubic area midline with small drainage site to the left in LLQ. Has a colostomy pouch over this site which was empty. No drainage from this site. Pouch removed and dressing placed over the site. Informed Naomi the bedside nurse for today and she will page me if any issues but I see no need for pouching at this time.   Thank you for the consult. WOC nurse will not follow at this time.   Please re-consult the WOC team if needed.  Renaldo Reel Katrinka Blazing, MSN, RN, CMSRN, Angus Seller, [facility] Wound Treatment Associate Pager [phone]

## 2021-08-11 NOTE — Progress Notes (Signed)
Patient ambulated 100' in hall with front wheel walker and assistance standing up. Tolerated ambulation well, still complained of pain in his abdomen but was still able to walk and talk. Will continue to assess and encourage ambulation.

## 2021-08-11 NOTE — Progress Notes (Signed)
PHARMACY - TOTAL PARENTERAL NUTRITION CONSULT NOTE  Indication: SBO/Prolonged ileus post-op  Patient Measurements: Height: 5\' 9"  (175.3 cm) Weight: 56.1 kg (123 lb 10.9 oz) IBW/kg (Calculated) : 70.7 TPN AdjBW (KG): 59 Body mass index is 18.26 kg/m. Usual Weight: 140 lbs  Assessment:  50 YOM presented on 07/10/2021 with abdominal pain, nausea, and vomiting for 3 days. PMH significant for polysubstance abuse, homelessness, bipolar disorder, SBO resulting in exlap, LOA and appendectomy at [facility] in May 2020. Patient reported one SBO since that was treated with NGT and improved. Pharmacy consulted to manage TPN for SBO, ileus.  Discussed with Psychiatry 10/26, patient more confused over past 2 days, patient has history of EtOH abuse, will add IV thiamine and F.A. to TPN in setting of worsening mentation.   Glucose / Insulin: no hx DM, A1c 5.3%. CBGs <100, off SSI Electrolytes: Na 136 (on max Na), CoCa 10.6, others wnl  Renal: SCr 0.6 stable, BUN wnl  Hepatic: LFTs wnl, TG 45, albumin 1.9 Intake / Output; MIVF: UOP 1.2 mL/kg/hr, no drain output charted ID: Meropenem (10/20>> ) for ESBL + proteus pelvic abscess. Afeb, WBC normlized  GI meds: PPI for suspected gastritis GI Imaging: 9/26 xray abd: concerning for SBO - 9/26 CT: high grade pSBO, suspected internal hernia vs. complex adhesions in RLQ - 9/27 xray abd: continued SBO - 9/28 xray abd: multiple gas filled dilated loops similar to CT - 10/4 CT abdomen/pelvis: large dumbbell-shaped pelvic abscess - 10/7 CT - no change pelvic abscess, no evidence of bowel obstruction - 10/17 CT abd:  Reduced pelvic abscess, new fluid collection in RUQ consistent w/ post-op abscess - 10/27 CT abd: ileus, no definite SBO, drain in place GI Surgeries / Procedures:  - 9/28 exlap, LOA, small intestine repair x2 - 10/4: LLQ drain catheter placement. - 10/5: ex lap with SBR for post-op bowel leak, drain placed - 10/11: drain exchange - 10/18:  Placement of drain for R-abd fluid collection  Central access: PICC placed 07/14/21 TPN start date: 07/14/21  Nutritional Goals:  RD recommendations increased 10/20 Estimated Needs Total Energy Estimated Needs: 2300-2500 Total Protein Estimated Needs: 110-130 grams Total Fluid Estimated Needs: >/= 2.0 L  Current Nutrition:  TPN; NPO  Plan: - Continue TPN cycled over 16 hours (GIR 3.4-6.7 mg/kg/hr) - TPN will provide 125 g AA (63%), 68 g lLE (37%) , and 342 g CHO for total 2348 kCal, meeting 100% of patient needs - Electrolytes in TPN: Na 150 mEq/L, K 40 mEq/L, Mg 8 mEq/L, Phos 12 mmol/L, Ca 5 mEq/L, Cl:Ac 1:1  ( at max K and Mg in TPN ) - MIVF - NS @ 50 mL/hr per surgery - Continue standard MVI and trace elements in TPN - IV thiamine and IV FA (added 10/26) per Psych for worsening mentation and hx ETOH  - Monitor standard TPN labs Mon/Thurs and PRN   Thank you for allowing pharmacy to be a part of this patient's care.  11/26, PharmD, BCPS, BCCP Clinical Pharmacist  Please check AMION for all [facility] Pharmacy phone numbers After 10:00 PM, call Main Pharmacy [phone]

## 2021-08-11 NOTE — Progress Notes (Signed)
Referring Physician(s): Dr. Derrell Lolling  Supervising Physician: Mir, Mauri Reading  Patient Status:  [facility] - In-pt  Chief Complaint: Intra-abdominal abscesses s/p drain placements: LLQ 07/19/21 and RUQ 08/01/21. LLQ drain inadvertently removed by patient 08/10/21  Subjective: Patient in bed talking on the phone animatedly talking about going home. He denies any pain or discomfort.   Allergies: Patient has no known allergies.  Medications: Prior to Admission medications   Medication Sig Start Date End Date Taking? Authorizing Provider  benztropine (COGENTIN) 1 MG tablet Take 1 tablet (1 mg total) by mouth 2 (two) times daily. Patient not taking: Reported on 07/11/2021 02/05/19   Malvin Johns, MD  divalproex (DEPAKOTE ER) 500 MG 24 hr tablet Take 1 tablet (500 mg total) by mouth 2 (two) times daily. Patient not taking: Reported on 07/11/2021 02/05/19   Malvin Johns, MD  fluPHENAZine (PROLIXIN) 5 MG tablet Take 1 tablet (5 mg total) by mouth 3 (three) times daily. Patient not taking: Reported on 07/11/2021 02/05/19   Malvin Johns, MD  gabapentin (NEURONTIN) 300 MG capsule Take 1 capsule (300 mg total) by mouth 3 (three) times daily. Patient not taking: No sig reported 02/05/19   Malvin Johns, MD  lidocaine (LIDODERM) 5 % Place 1 patch onto the skin daily. Remove & Discard patch within 12 hours or as directed by MD Patient not taking: Reported on 07/11/2021 05/04/19   Caccavale, Sophia, PA-C  naproxen (NAPROSYN) 500 MG tablet Take 1 tablet (500 mg total) by mouth 2 (two) times daily with a meal. Patient not taking: Reported on 07/11/2021 05/04/19   Caccavale, Sophia, PA-C     Vital Signs: BP 98/61 (BP Location: Left Arm)   Pulse 71   Temp 98.1 F (36.7 C) (Oral)   Resp 18   Ht 5\' 9"  (1.753 m)   Wt 123 lb 10.9 oz (56.1 kg)   SpO2 99%   BMI 18.26 kg/m   Physical Exam Constitutional:      General: He is not in acute distress. Abdominal:     Palpations: Abdomen is soft.     Tenderness: There is  no abdominal tenderness.     Comments: RUQ drain to suction. Approximately 20 ml of serosanguineous fluid in bulb. Drain easily flushed/aspirated.   Skin:    General: Skin is warm and dry.  Neurological:     Mental Status: He is alert and oriented to person, place, and time.    Imaging: CT ABDOMEN PELVIS W CONTRAST  Result Date: 08/10/2021 CLINICAL DATA:  Abdominal pain and emesis. Status post small bowel repair with subsequent leak and known fistula through the left lower quadrant drain. EXAM: CT ABDOMEN AND PELVIS WITH CONTRAST TECHNIQUE: Multidetector CT imaging of the abdomen and pelvis was performed using the standard protocol following bolus administration of intravenous contrast. CONTRAST:  08/12/2021 OMNIPAQUE IOHEXOL 350 MG/ML SOLN COMPARISON:  CT abdomen and pelvis 07/31/2021. FINDINGS: Lower chest: There is atelectasis in the right lower lobe which has decreased. Hepatobiliary: There is a hypodensity in the dome of the liver which is too small to characterize image 3/14. Liver, gallbladder and bile ducts are otherwise within normal limits. Pancreas: Unremarkable. No pancreatic ductal dilatation or surrounding inflammatory changes. Spleen: Normal in size without focal abnormality. Adrenals/Urinary Tract: The kidneys and adrenal glands are within normal limits. There is a small amount of air in the bladder similar to the prior study. Stomach/Bowel: The oral contrast reaches the rectum. There are borderline dilated small bowel loops in the lower abdomen which  may represent mild ileus. There is no definite obstruction. There is no focal bowel wall thickening or inflammation. The appendix is not seen. Stomach is within normal limits. Vascular/Lymphatic: Aortic atherosclerosis. No enlarged abdominal or pelvic lymph nodes. Reproductive: Prostate is unremarkable. Other: Left pelvic drainage catheter is unchanged in position. Previously identified fluid collection in the central pelvis is no longer seen.  There is a new percutaneous pigtail catheter in the right upper quadrant draining previously identified air-fluid collection in the right upper quadrant. This collection has nearly completely resolved in the interval. Small collection persists measuring 2.0 by 0.5 by 0.9 cm image 3/47. There is intramuscular edema and a small amount of air in the abdominal wall at the level of this catheter without discrete measurable fluid collection. There is no gross free intraperitoneal air or ascites. Musculoskeletal: No acute or significant osseous findings. IMPRESSION: 1. Borderline dilated pelvic small bowel loops favored as ileus. There is no definite bowel obstruction. Oral contrast reaches the rectum. 2. Pelvic fluid collection is no longer seen. Left pelvic drain in place. 3. New right upper quadrant percutaneous drainage catheter in place. There has been interval near complete resolution of right upper quadrant fluid collection. There is abdominal wall edema and air at the level of the catheter. Infection is not excluded at this level. No abscess in the abdominal wall. 4.  Aortic Atherosclerosis (ICD10-I70.0). Electronically Signed   By: Darliss Cheney M.D.   On: 08/10/2021 15:16    Labs:  CBC: Recent Labs    08/05/21 0356 08/06/21 0430 08/07/21 0359 08/08/21 0509  WBC 13.0* 10.8* 7.9 7.6  HGB 9.7* 10.0* 10.4* 9.7*  HCT 29.8* 31.7* 33.3* 31.0*  PLT 558* 589* 563* 545*    COAGS: Recent Labs    07/18/21 0823  INR 1.1    BMP: Recent Labs    08/07/21 0359 08/08/21 0509 08/09/21 1011 08/10/21 0352  NA 135 136 137 136  K 4.9 4.5 4.7 4.3  CL 102 103 105 103  CO2 25 28 28 28   GLUCOSE 98 89 90 97  BUN 22* 23* 25* 22*  CALCIUM 8.9 8.9 8.8* 9.1  CREATININE 0.63 0.65 0.65 0.60*  GFRNONAA >60 >60 >60 >60    LIVER FUNCTION TESTS: Recent Labs    07/31/21 0333 08/03/21 0359 08/07/21 0359 08/10/21 0352  BILITOT 0.2* 0.2* 0.3 0.4  AST 10* 11* 17 21  ALT 12 13 20 22   ALKPHOS 128* 151* 111  101  PROT 7.5 7.4 7.8 8.0  ALBUMIN 1.6* <1.5* 1.9* 2.1*    Drain Location: RUQ Size: 10 Fr. Date of placement: 08/01/21  Currently to: Drain collection device: suction bulb 24 hour output:  Output by Drain (mL) 08/09/21 0701 - 08/09/21 1900 08/09/21 1901 - 08/10/21 0700 08/10/21 0701 - 08/10/21 1900 08/10/21 1901 - 08/11/21 0700 08/11/21 0701 - 08/11/21 1653  Closed System Drain Left LLQ Other (Comment) 20 Fr. 20 30     Closed System Drain 4 Lateral RUQ Bulb (JP) 10 Fr. 5 10       Interval imaging/drain manipulation:  CT abdomen/pelvis 08/10/21 after LLQ drain inadvertently removed by patient. CT shows near-resolution of intra-abdominal fluid collections.   Current examination: Flushes/aspirates easily.  Insertion site unremarkable. Suture and stat lock in place. Dressed appropriately.   Plan: Continue TID flushes with 5 cc NS. Record output Q shift. Dressing changes QD or PRN if soiled.  Call IR APP or on call IR MD if difficulty flushing or sudden change in drain  output.   RUQ drain with approximately 20 ml output (in bulb during exam) in the past 24 hours. IR recommends repeat CT imaging early next week to see if the small fluid collection identified on 08/10/21 has completely resolved. Will consider drain removal if resolved and daily output <10 ml.   Discharge planning: Please contact IR APP or on call IR MD prior to patient d/c to ensure appropriate follow up plans are in place. Typically patient will follow up with IR clinic 10-14 days post d/c for repeat imaging/possible drain injection. IR scheduler will contact patient with date/time of appointment. Patient will need to flush drain QD with 5 cc NS, record output QD, dressing changes every 2-3 days or earlier if soiled.   IR will continue to follow - please call with questions or concerns.  Electronically Signed: Alwyn Ren, AGACNP-BC [phone] 08/11/2021, 4:51 PM   I spent a total of 15 Minutes at the the  patient's bedside AND on the patient's hospital floor or unit, greater than 50% of which was counseling/coordinating care for abscess drain.

## 2021-08-12 LAB — CBC
HCT: 30.9 % — ABNORMAL LOW (ref 39.0–52.0)
Hemoglobin: 9.7 g/dL — ABNORMAL LOW (ref 13.0–17.0)
MCH: 28.4 pg (ref 26.0–34.0)
MCHC: 31.4 g/dL (ref 30.0–36.0)
MCV: 90.6 fL (ref 80.0–100.0)
Platelets: 434 10*3/uL — ABNORMAL HIGH (ref 150–400)
RBC: 3.41 MIL/uL — ABNORMAL LOW (ref 4.22–5.81)
RDW: 14.1 % (ref 11.5–15.5)
WBC: 5.1 10*3/uL (ref 4.0–10.5)
nRBC: 0 % (ref 0.0–0.2)

## 2021-08-12 LAB — GLUCOSE, CAPILLARY
Glucose-Capillary: 105 mg/dL — ABNORMAL HIGH (ref 70–99)
Glucose-Capillary: 102 mg/dL — ABNORMAL HIGH (ref 70–99)
Glucose-Capillary: 86 mg/dL (ref 70–99)

## 2021-08-12 MED ORDER — TRAVASOL 10 % IV SOLN
INTRAVENOUS | Status: AC
  Filled 2021-08-12: qty 1254

## 2021-08-12 MED ORDER — MORPHINE SULFATE (PF) 2 MG/ML IV SOLN
2.0000 mg | INTRAVENOUS | Status: DC | PRN
  Administered 2021-08-12 – 2021-08-13 (×7): 6 mg via INTRAVENOUS
  Administered 2021-08-14: 2 mg via INTRAVENOUS
  Administered 2021-08-14 – 2021-08-15 (×7): 6 mg via INTRAVENOUS
  Filled 2021-08-12 (×15): qty 3

## 2021-08-12 NOTE — Progress Notes (Addendum)
PHARMACY - TOTAL PARENTERAL NUTRITION CONSULT NOTE  Indication: SBO/Prolonged ileus post-op  Patient Measurements: Height: 5\' 9"  (175.3 cm) Weight: 56.1 kg (123 lb 10.9 oz) IBW/kg (Calculated) : 70.7 TPN AdjBW (KG): 59 Body mass index is 18.26 kg/m. Usual Weight: 140 lbs  Assessment:  50 YOM presented on 07/10/2021 with abdominal pain, nausea, and vomiting for 3 days. PMH significant for polysubstance abuse, homelessness, bipolar disorder, SBO resulting in exlap, LOA and appendectomy at [facility] in May 2020. Patient reported one SBO since that was treated with NGT and improved. Pharmacy consulted to manage TPN for SBO, ileus.  Discussed with Psychiatry 10/26, patient more confused over past 2 days, patient has history of EtOH abuse, will add IV thiamine and F.A. to TPN in setting of worsening mentation.   Glucose / Insulin: no hx DM, A1c 5.3%. CBGs 93-105, off SSI Electrolytes: Na 136 (on max Na), CoCa 10.6, others wnl  Renal: SCr 0.6 stable, BUN wnl  Hepatic: LFTs wnl, TG 45, albumin 1.9 Intake / Output; MIVF: UOP 1.2 mL/kg/hr, no drain output charted ID: Meropenem (10/20>> ) for ESBL + proteus pelvic abscess. Afeb, WBC normlized  GI meds: PPI for suspected gastritis GI Imaging: 9/26 xray abd: concerning for SBO - 9/26 CT: high grade pSBO, suspected internal hernia vs. complex adhesions in RLQ - 9/27 xray abd: continued SBO - 9/28 xray abd: multiple gas filled dilated loops similar to CT - 10/4 CT abdomen/pelvis: large dumbbell-shaped pelvic abscess - 10/7 CT - no change pelvic abscess, no evidence of bowel obstruction - 10/17 CT abd:  Reduced pelvic abscess, new fluid collection in RUQ consistent w/ post-op abscess - 10/27 CT abd: ileus, no definite SBO, drain in place GI Surgeries / Procedures:  - 9/28 exlap, LOA, small intestine repair x2 - 10/4: LLQ drain catheter placement. - 10/5: ex lap with SBR for post-op bowel leak, drain placed - 10/11: drain exchange - 10/18:  Placement of drain for R-abd fluid collection  Central access: PICC placed 07/14/21 TPN start date: 07/14/21  Nutritional Goals:  RD recommendations increased 10/20 Estimated Needs Total Energy Estimated Needs: 2300-2500 Total Protein Estimated Needs: 110-130 grams Total Fluid Estimated Needs: >/= 2.0 L  Current Nutrition:  TPN; NPO  Plan: - Continue TPN cycled over 16 hours (GIR 3.4-6.7 mg/kg/hr) - TPN will provide 125 g AA (63%), 68 g lLE (37%) , and 342 g CHO for total 2348 kCal, meeting 100% of patient needs - Electrolytes in TPN: Na 150 mEq/L, K 40 mEq/L, Mg 8 mEq/L, Phos 12 mmol/L, Ca 5 mEq/L, Cl:Ac 1:1  ( at max K and Mg in TPN ) - MIVF - NS @ 50 mL/hr per surgery - Continue standard MVI and trace elements in TPN - IV thiamine and IV FA (added 10/26) per Psych for worsening mentation and hx ETOH  - Monitor standard TPN labs Mon/Thurs and PRN - Will check BMET in AM  - Possible trial of clears soon    Thank you for allowing pharmacy to be a part of this patient's care.  11/26, PharmD., BCPS, BCCCP Clinical Pharmacist Please refer to [facility] for unit-specific pharmacist

## 2021-08-12 NOTE — Progress Notes (Signed)
24 Days Post-Op   Subjective/Chief Complaint: No complaints this am  Objective: Vital signs in last 24 hours: Temp:  [97.8 F (36.6 C)-98.4 F (36.9 C)] 98.4 F (36.9 C) (10/29 0800) Pulse Rate:  [66-75] 68 (10/29 0800) Resp:  [17-18] 18 (10/29 0800) BP: (99-108)/(64-74) 99/65 (10/29 0800) SpO2:  [99 %-100 %] 99 % (10/29 0800) Last BM Date: 08/06/21  Intake/Output from previous day: 10/28 0701 - 10/29 0700 In: 0  Out: 2225 [Urine:2225] Intake/Output this shift: No intake/output data recorded.  General appearance: alert and cooperative Resp: clear to auscultation bilaterally Cardio: regular rate and rhythm GI: appropriately tender. RUQ drain in place, clean dressing over LLQ wound where previous drain was.    Lab Results:   BMET Recent Labs    08/09/21 1011 08/10/21 0352  NA 137 136  K 4.7 4.3  CL 105 103  CO2 28 28  GLUCOSE 90 97  BUN 25* 22*  CREATININE 0.65 0.60*  CALCIUM 8.8* 9.1    Studies/Results: CT ABDOMEN PELVIS W CONTRAST  Result Date: 08/10/2021 CLINICAL DATA:  Abdominal pain and emesis. Status post small bowel repair with subsequent leak and known fistula through the left lower quadrant drain. EXAM: CT ABDOMEN AND PELVIS WITH CONTRAST TECHNIQUE: Multidetector CT imaging of the abdomen and pelvis was performed using the standard protocol following bolus administration of intravenous contrast. CONTRAST:  OMNIPAQUE IOHEXOL 350 MG/ML SOLN COMPARISON:  CT abdomen and pelvis 07/31/2021. FINDINGS: Lower chest: There is atelectasis in the right lower lobe which has decreased. Hepatobiliary: There is a hypodensity in the dome of the liver which is too small to characterize image 3/14. Liver, gallbladder and bile ducts are otherwise within normal limits. Pancreas: Unremarkable. No pancreatic ductal dilatation or surrounding inflammatory changes. Spleen: Normal in size without focal abnormality. Adrenals/Urinary Tract: The kidneys and adrenal glands are within  normal limits. There is a small amount of air in the bladder similar to the prior study. Stomach/Bowel: The oral contrast reaches the rectum. There are borderline dilated small bowel loops in the lower abdomen which may represent mild ileus. There is no definite obstruction. There is no focal bowel wall thickening or inflammation. The appendix is not seen. Stomach is within normal limits. Vascular/Lymphatic: Aortic atherosclerosis. No enlarged abdominal or pelvic lymph nodes. Reproductive: Prostate is unremarkable. Other: Left pelvic drainage catheter is unchanged in position. Previously identified fluid collection in the central pelvis is no longer seen. There is a new percutaneous pigtail catheter in the right upper quadrant draining previously identified air-fluid collection in the right upper quadrant. This collection has nearly completely resolved in the interval. Small collection persists measuring 2.0 by 0.5 by 0.9 cm image 3/47. There is intramuscular edema and a small amount of air in the abdominal wall at the level of this catheter without discrete measurable fluid collection. There is no gross free intraperitoneal air or ascites. Musculoskeletal: No acute or significant osseous findings. IMPRESSION: 1. Borderline dilated pelvic small bowel loops favored as ileus. There is no definite bowel obstruction. Oral contrast reaches the rectum. 2. Pelvic fluid collection is no longer seen. Left pelvic drain in place. 3. New right upper quadrant percutaneous drainage catheter in place. There has been interval near complete resolution of right upper quadrant fluid collection. There is abdominal wall edema and air at the level of the catheter. Infection is not excluded at this level. No abscess in the abdominal wall. 4.  Aortic Atherosclerosis (ICD10-I70.0). Electronically Signed   By: Mcneil Sober.D.  On: 08/10/2021 15:16    Anti-infectives: Anti-infectives (From admission, onward)    Start     Dose/Rate  Route Frequency Ordered Stop   08/03/21 1900  meropenem (MERREM) 1 g in sodium chloride 0.9 % 100 mL IVPB        1 g 200 mL/hr over 30 Minutes Intravenous Every 8 hours 08/03/21 1527     07/18/21 1000  piperacillin-tazobactam (ZOSYN) IVPB 3.375 g  Status:  Discontinued        3.375 g 12.5 mL/hr over 240 Minutes Intravenous Every 8 hours 07/18/21 0839 08/03/21 1527   07/12/21 1200  ceFAZolin (ANCEF) IVPB 2g/100 mL premix        2 g 200 mL/hr over 30 Minutes Intravenous To Short Stay 07/12/21 0843 07/12/21 1100       Assessment/Plan: s/p Procedure(s): EXPLORATORY LAPAROTOMY SMALL BOWEL REPAIR OF PERFORATION AND DRAINAGE OF INTERABDOMINAL CYST (N/A) Continue bowel rest and tpn for nutrition support POD#27 - S/P EXPLORATORY LAPAROTOMY; LYSIS OF ADHESIONS; SMALL INTESTINE REPAIR - 07/12/21 Dr. Sheliah Hatch for SBO POD#19- S/P LAPAROTOMY, REPAIR OF SMALL BOWEL PERFORATION, PLACEMENT 53F BLAKE DRAIN - 07/19/21 Dr Carolynne Edouard - high output fistula - On octreotide (patient has been refusing this medication so D/C-ed today 10/28); output seems to be decreasing - continue strict monitoring of output. 75 cc documented last 24 hours. May be able to trial clears if continues to decrease. - pelvic abscess drain upsized by IR 10/11, RUQ drain placed by IR 10/18 - IR RUQ drain with 5-15 cc/24h; drain culture with ESBL e coli resistant to zosyn, switched to merrem 10/20 - repeat CT A/P 10/27 w/ resolution of pelvic fluid collection and near complete resolution RUQ collection, unfortunately LLQ drain became dislodged and removed after this scan, will ask IR to evaluate for drain replacement along with possible removal of RUQ drain.  - PICC/TPN. NGT out - continue IV abx - OOB, mobilize with PT and staff  - incentive spirometry q 1h - continue PPI  - multimodal pain control - IV tylenol, PRN IV morphine, wean IV morphine   FEN - NPO, PICC/TNA 9/30 >>  VTE - lovenox (has been refusing) ID - ancef x1 dose 9/28  perioperatively, Zosyn 10/4 >> 10/20, Merrem 10/20>>    Foley - removed, voiding well Dispo - med-surg, TPN, IR consult for LLQ drain replacement/RUQ drain removal, possible trial of clears today or tomorrow. Stop octreotide as pt refusing.   Bipolar disorder Homelessness Polysubstance abuse - psych consulted 10/24 and started IM Zyprexa, appreciate their assistance.   Plan for the weekend to continue current management, re-assess for any undrained fluid collections on Monday.  Possible removal of RUQ drain Monday.  Possible advancement of diet Monday.   LOS: 33 days    Quentin Ore 08/12/2021   I personally saw the patient and performed a substantive portion of this encounter, including a complete performance of at least one of the key components (MDM, Hx and/or Exam), in conjunction with the Advanced Practice Provider Hosie Spangle, PA-C. CT scan yesterday with improvement in all fluid collections.  Suspect that the right upper quadrant drain can be removed.  Unfortunately the patient pulled the fistula drain (LLQ) yesterday afternoon.  IR consulted for possible replacement.

## 2021-08-13 LAB — BASIC METABOLIC PANEL
Anion gap: 7 (ref 5–15)
BUN: 21 mg/dL — ABNORMAL HIGH (ref 6–20)
CO2: 28 mmol/L (ref 22–32)
Calcium: 9.1 mg/dL (ref 8.9–10.3)
Chloride: 102 mmol/L (ref 98–111)
Creatinine, Ser: 0.65 mg/dL (ref 0.61–1.24)
GFR, Estimated: >60 mL/min (ref >60)
Glucose, Bld: 108 mg/dL — ABNORMAL HIGH (ref 70–99)
Potassium: 4.6 mmol/L (ref 3.5–5.1)
Sodium: 137 mmol/L (ref 135–145)

## 2021-08-13 LAB — GLUCOSE, CAPILLARY
Glucose-Capillary: 114 mg/dL — ABNORMAL HIGH (ref 70–99)
Glucose-Capillary: 82 mg/dL (ref 70–99)
Glucose-Capillary: 90 mg/dL (ref 70–99)

## 2021-08-13 MED ORDER — STERILE WATER FOR INJECTION IJ SOLN
INTRAMUSCULAR | Status: AC
  Administered 2021-08-13: 10 mL
  Filled 2021-08-13: qty 10

## 2021-08-13 MED ORDER — TRAVASOL 10 % IV SOLN
INTRAVENOUS | Status: AC
  Filled 2021-08-13: qty 1254

## 2021-08-13 NOTE — Progress Notes (Signed)
25 Days Post-Op   Subjective/Chief Complaint: No issues this morning  Objective: Vital signs in last 24 hours: Temp:  [97.7 F (36.5 C)-98.9 F (37.2 C)] 98.2 F (36.8 C) (10/30 0759) Pulse Rate:  [64-70] 69 (10/30 0759) Resp:  [17-18] 17 (10/30 0759) BP: (92-112)/(60-76) 92/60 (10/30 0759) SpO2:  [99 %-100 %] 99 % (10/30 0759) Last BM Date: 08/06/21  Intake/Output from previous day: 10/29 0701 - 10/30 0700 In: -  Out: 1058 [Urine:1050; Drains:8] Intake/Output this shift: Total I/O In: -  Out: 300 [Urine:300]  General appearance: alert and cooperative Resp: clear to auscultation bilaterally Cardio: regular rate and rhythm GI: appropriately tender. RUQ drain in place, clean dressing over LLQ wound where previous drain was.    Lab Results:   BMET No results for input(s): NA, K, CL, CO2, GLUCOSE, BUN, CREATININE, CALCIUM in the last 72 hours.  Studies/Results: No results found.  Anti-infectives: Anti-infectives (From admission, onward)    Start     Dose/Rate Route Frequency Ordered Stop   08/03/21 1900  meropenem (MERREM) 1 g in sodium chloride 0.9 % 100 mL IVPB        1 g 200 mL/hr over 30 Minutes Intravenous Every 8 hours 08/03/21 1527     07/18/21 1000  piperacillin-tazobactam (ZOSYN) IVPB 3.375 g  Status:  Discontinued        3.375 g 12.5 mL/hr over 240 Minutes Intravenous Every 8 hours 07/18/21 0839 08/03/21 1527   07/12/21 1200  ceFAZolin (ANCEF) IVPB 2g/100 mL premix        2 g 200 mL/hr over 30 Minutes Intravenous To Short Stay 07/12/21 0843 07/12/21 1100       Assessment/Plan: s/p Procedure(s): EXPLORATORY LAPAROTOMY SMALL BOWEL REPAIR OF PERFORATION AND DRAINAGE OF INTERABDOMINAL CYST (N/A) Continue bowel rest and tpn for nutrition support POD#27 - S/P EXPLORATORY LAPAROTOMY; LYSIS OF ADHESIONS; SMALL INTESTINE REPAIR - 07/12/21 Dr. Sheliah Hatch for SBO POD#19- S/P LAPAROTOMY, REPAIR OF SMALL BOWEL PERFORATION, PLACEMENT 42F BLAKE DRAIN - 07/19/21 Dr  Carolynne Edouard - high output fistula - On octreotide (patient has been refusing this medication so D/C-ed today 10/28); output seems to be decreasing - continue strict monitoring of output. 75 cc documented last 24 hours. May be able to trial clears if continues to decrease. - pelvic abscess drain upsized by IR 10/11, RUQ drain placed by IR 10/18 - IR RUQ drain with 5-15 cc/24h; drain culture with ESBL e coli resistant to zosyn, switched to merrem 10/20 - repeat CT A/P 10/27 w/ resolution of pelvic fluid collection and near complete resolution RUQ collection, unfortunately LLQ drain became dislodged and removed after this scan, will ask IR to evaluate for drain replacement along with possible removal of RUQ drain.  - PICC/TPN. NGT out - continue IV abx - OOB, mobilize with PT and staff  - incentive spirometry q 1h - continue PPI  - multimodal pain control - IV tylenol, PRN IV morphine, wean IV morphine   FEN - NPO, PICC/TNA 9/30 >>  VTE - lovenox (has been refusing) ID - ancef x1 dose 9/28 perioperatively, Zosyn 10/4 >> 10/20, Merrem 10/20>>    Foley - removed, voiding well Dispo - med-surg, TPN, IR consult for LLQ drain replacement/RUQ drain removal, possible trial of clears today or tomorrow. Stop octreotide as pt refusing.   Bipolar disorder Homelessness Polysubstance abuse - psych consulted 10/24 and started IM Zyprexa, appreciate their assistance.   Plan for the weekend to continue current management, re-assess for any undrained fluid collections on  Monday.  Possible removal of RUQ drain Monday.  Possible advancement of diet Monday.   LOS: 34 days    Thomas [name] 08/13/2021   I personally saw the patient and performed a substantive portion of this encounter, including a complete performance of at least one of the key components (MDM, Hx and/or Exam), in conjunction with the Advanced Practice Provider Hosie Spangle, PA-C. CT scan yesterday with improvement in all fluid  collections.  Suspect that the right upper quadrant drain can be removed.  Unfortunately the patient pulled the fistula drain (LLQ) yesterday afternoon.  IR consulted for possible replacement.

## 2021-08-13 NOTE — Progress Notes (Signed)
PHARMACY - TOTAL PARENTERAL NUTRITION CONSULT NOTE  Indication: SBO/Prolonged ileus post-op  Patient Measurements: Height: 5\' 9"  (175.3 cm) Weight: 56.1 kg (123 lb 10.9 oz) IBW/kg (Calculated) : 70.7 TPN AdjBW (KG): 59 Body mass index is 18.26 kg/m. Usual Weight: 140 lbs  Assessment:  50 YOM presented on 07/10/2021 with abdominal pain, nausea, and vomiting for 3 days. PMH significant for polysubstance abuse, homelessness, bipolar disorder, SBO resulting in exlap, LOA and appendectomy at [facility] in May 2020. Patient reported one SBO since that was treated with NGT and improved. Pharmacy consulted to manage TPN for SBO, ileus.  Discussed with Psychiatry 10/26, patient more confused over past 2 days, patient has history of EtOH abuse, will add IV thiamine and F.A. to TPN in setting of worsening mentation.   Glucose / Insulin: no hx DM, A1c 5.3%. CBGs 86-114, off SSI Electrolytes: Na 137 (on max Na), CoCa 10.6, others wnl  Renal: SCr 0.6 stable, BUN wnl  Hepatic: LFTs wnl, TG 45, albumin 1.9 Intake / Output; MIVF: UOP 1.2 mL/kg/hr, no drain output charted ID: Meropenem (10/20>> ) for ESBL + proteus pelvic abscess. Afeb, WBC normlized  GI meds: PPI for suspected gastritis GI Imaging: 9/26 xray abd: concerning for SBO - 9/26 CT: high grade pSBO, suspected internal hernia vs. complex adhesions in RLQ - 9/27 xray abd: continued SBO - 9/28 xray abd: multiple gas filled dilated loops similar to CT - 10/4 CT abdomen/pelvis: large dumbbell-shaped pelvic abscess - 10/7 CT - no change pelvic abscess, no evidence of bowel obstruction - 10/17 CT abd:  Reduced pelvic abscess, new fluid collection in RUQ consistent w/ post-op abscess - 10/27 CT abd: ileus, no definite SBO, drain in place GI Surgeries / Procedures:  - 9/28 exlap, LOA, small intestine repair x2 - 10/4: LLQ drain catheter placement. - 10/5: ex lap with SBR for post-op bowel leak, drain placed - 10/11: drain exchange - 10/18:  Placement of drain for R-abd fluid collection  Central access: PICC placed 07/14/21 TPN start date: 07/14/21  Nutritional Goals:  RD recommendations increased 10/20 Estimated Needs Total Energy Estimated Needs: 2300-2500 Total Protein Estimated Needs: 110-130 grams Total Fluid Estimated Needs: >/= 2.0 L  Current Nutrition:  TPN; NPO  Plan: - Continue TPN cycled over 16 hours (GIR 3.4-6.7 mg/kg/hr) - TPN will provide 125 g AA (63%), 68 g lLE (37%) , and 342 g CHO for total 2348 kCal, meeting 100% of patient needs - Electrolytes in TPN: Na 150 mEq/L, K 40 mEq/L, Mg 8 mEq/L, Phos 12 mmol/L, Ca 5 mEq/L, Cl:Ac 1:1  ( at max K and Mg in TPN ) - MIVF - NS @ 50 mL/hr per surgery - Continue standard MVI and trace elements in TPN - IV thiamine and IV FA (added 10/26) per Psych for worsening mentation and hx ETOH  - Monitor standard TPN labs Mon/Thurs and PRN - Possible trial of diet on Monday    Thank you for allowing pharmacy to be a part of this patient's care.  Sunday, PharmD., BCPS, BCCCP Clinical Pharmacist Please refer to [facility] for unit-specific pharmacist

## 2021-08-14 ENCOUNTER — Inpatient Hospital Stay (HOSPITAL_COMMUNITY): Payer: Self-pay

## 2021-08-14 LAB — MAGNESIUM: Magnesium: 1.8 mg/dL (ref 1.7–2.4)

## 2021-08-14 LAB — COMPREHENSIVE METABOLIC PANEL
ALT: 15 U/L (ref 0–44)
AST: 16 U/L (ref 15–41)
Albumin: 2.2 g/dL — ABNORMAL LOW (ref 3.5–5.0)
Alkaline Phosphatase: 89 U/L (ref 38–126)
Anion gap: 4 — ABNORMAL LOW (ref 5–15)
BUN: 19 mg/dL (ref 6–20)
CO2: 28 mmol/L (ref 22–32)
Calcium: 9.1 mg/dL (ref 8.9–10.3)
Chloride: 104 mmol/L (ref 98–111)
Creatinine, Ser: 0.6 mg/dL — ABNORMAL LOW (ref 0.61–1.24)
GFR, Estimated: >60 mL/min (ref >60)
Glucose, Bld: 93 mg/dL (ref 70–99)
Potassium: 4.3 mmol/L (ref 3.5–5.1)
Sodium: 136 mmol/L (ref 135–145)
Total Bilirubin: 0.4 mg/dL (ref 0.3–1.2)
Total Protein: 7.3 g/dL (ref 6.5–8.1)

## 2021-08-14 LAB — GLUCOSE, CAPILLARY
Glucose-Capillary: 97 mg/dL (ref 70–99)
Glucose-Capillary: 111 mg/dL — ABNORMAL HIGH (ref 70–99)
Glucose-Capillary: 91 mg/dL (ref 70–99)

## 2021-08-14 LAB — PHOSPHORUS: Phosphorus: 4.2 mg/dL (ref 2.5–4.6)

## 2021-08-14 LAB — TRIGLYCERIDES: Triglycerides: 53 mg/dL (ref <150)

## 2021-08-14 MED ORDER — TRAVASOL 10 % IV SOLN
INTRAVENOUS | Status: AC
  Filled 2021-08-14: qty 1254

## 2021-08-14 MED ORDER — IOHEXOL 350 MG/ML SOLN
75.0000 mL | Freq: Once | INTRAVENOUS | Status: AC | PRN
  Administered 2021-08-14: 75 mL via INTRAVENOUS

## 2021-08-14 MED ORDER — STERILE WATER FOR INJECTION IJ SOLN
INTRAMUSCULAR | Status: AC
  Filled 2021-08-14: qty 10

## 2021-08-14 NOTE — Progress Notes (Addendum)
PHARMACY - TOTAL PARENTERAL NUTRITION CONSULT NOTE  Indication: SBO/Prolonged ileus   Patient Measurements: Height: 5\' 9"  (175.3 cm) Weight: 58.3 kg (128 lb 8.5 oz) IBW/kg (Calculated) : 70.7 TPN AdjBW (KG): 59 Body mass index is 18.98 kg/m. Usual Weight: 140 lbs  Assessment:  50 YOM presented on 07/10/2021 with abdominal pain, nausea, and vomiting for 3 days. PMH significant for polysubstance abuse, homelessness, bipolar disorder, SBO resulting in exlap, LOA and appendectomy at [facility] in May 2020. Patient reported one SBO since that was treated with NGT and improved. Pharmacy consulted to manage TPN for SBO, ileus.  Glucose / Insulin: no hx DM, A1c 5.3%. CBGs low normal, off SSI Electrolytes: 10/31 labs - Na 136 (on max Na), Mag 1.8 (goal >/= 2), CoCa 10.5, others WNL Renal: SCr < 1, BUN WNL Hepatic: LFTs / tbili / TG WNL, albumin 2.2 Intake / Output: MIVF: UOP 0.5 mL/kg/hr, drain O/P 56mL ID: Merrem (10/20 >> ) for ESBL + proteus pelvic abscess. Afebrile, WBC normlized  GI Imaging: 9/26 xray abd: concerning for SBO - 9/26 CT: high grade pSBO, suspected internal hernia vs. complex adhesions in RLQ - 9/27 xray abd: continued SBO - 9/28 xray abd: multiple gas filled dilated loops similar to CT - 10/4 CT abdomen/pelvis: large dumbbell-shaped pelvic abscess - 10/7 CT: no change pelvic abscess, no evidence of bowel obstruction - 10/17 CT abd:  Reduced pelvic abscess, new RUQ post-op abscess - 10/27 CT abd: ileus, no definite SBO, drain in place - 10/31 CT - no extravasation of PO contrast or fluid collection, foci of gas in bladder GI Surgeries / Procedures:  - 9/28 exlap, LOA, small intestine repair x2 - 10/4: LLQ drain catheter placement - 10/5: ex lap with SBR for post-op bowel leak, drain placed - 10/11: drain exchange - 10/18: Placement of drain for R-abd fluid collection  Central access: PICC placed 07/14/21 TPN start date: 07/14/21  Nutritional Goals:  RD rec increased  10/20 Estimated Needs Total Energy Estimated Needs: 2300-2500 Total Protein Estimated Needs: 110-130 grams Total Fluid Estimated Needs: >/= 2.0 L  Current Nutrition:  TPN CLD started 10/31 - consume up to 100% of meals  Plan: - Continue 16-hr cyclic TPN (GIR 3.4-6.7 mg/kg/hr).  Keep 16-hr cycle d/t high GIR. - TPN provides 125g AA, 68g lLE and 342g CHO for total 2348 kCal, meeting 100% of patient needs - Electrolytes in TPN: Na 150 mEq/L, K 40 mEq/L, increase Mg to 10 mEq/L, reduce Phos to 67mmol/L, reduce Ca to 69mEq/L, Cl:Ac 1:1 - Continue standard MVI and trace elements in TPN - IV thiamine and IV FA (added 10/26) per Psych for worsening mentation and hx ETOH - NS at 50 mL/hr per Surgery   - Monitor standard TPN labs Mon/Thurs - F/U PO intake/diet advancement to start weaning TPN - F/U Merrem LOT  Sharaine Delange D. 11-21-1981, PharmD, BCPS, BCCCP 08/15/2021, 10:14 AM

## 2021-08-14 NOTE — Progress Notes (Signed)
PHARMACY - TOTAL PARENTERAL NUTRITION CONSULT NOTE  Indication: SBO/Prolonged ileus post-op  Patient Measurements: Height: 5\' 9"  (175.3 cm) Weight: 58.3 kg (128 lb 8.5 oz) IBW/kg (Calculated) : 70.7 TPN AdjBW (KG): 59 Body mass index is 18.98 kg/m. Usual Weight: 140 lbs  Assessment:  50 YOM presented on 07/10/2021 with abdominal pain, nausea, and vomiting for 3 days. PMH significant for polysubstance abuse, homelessness, bipolar disorder, SBO resulting in exlap, LOA and appendectomy at [facility] in May 2020. Patient reported one SBO since that was treated with NGT and improved. Pharmacy consulted to manage TPN for SBO, ileus.  Discussed with Psychiatry 10/26, patient more confused over past 2 days, patient has history of EtOH abuse, will add IV thiamine and F.A. to TPN in setting of worsening mentation.   Glucose / Insulin: no hx DM, A1c 5.3%. CBGs <140, off SSI Electrolytes: Na 136 (on max Na), CoCa 10.5, others wnl  Renal: SCr 0.6 stable, BUN wnl  Hepatic: LFTs / TG wnl, albumin 2.2 Intake / Output: MIVF: UOP 1.7 mL/kg/hr, 3 ml drain output charted ID: Meropenem (10/20>> ) for ESBL + proteus pelvic abscess. Afeb, WBC normlized  GI meds: PPI for suspected gastritis GI Imaging: 9/26 xray abd: concerning for SBO - 9/26 CT: high grade pSBO, suspected internal hernia vs. complex adhesions in RLQ - 9/27 xray abd: continued SBO - 9/28 xray abd: multiple gas filled dilated loops similar to CT - 10/4 CT abdomen/pelvis: large dumbbell-shaped pelvic abscess - 10/7 CT - no change pelvic abscess, no evidence of bowel obstruction - 10/17 CT abd:  Reduced pelvic abscess, new fluid collection in RUQ consistent w/ post-op abscess - 10/27 CT abd: ileus, no definite SBO, drain in place GI Surgeries / Procedures:  - 9/28 exlap, LOA, small intestine repair x2 - 10/4: LLQ drain catheter placement - 10/5: ex lap with SBR for post-op bowel leak, drain placed - 10/11: drain exchange - 10/18:  Placement of drain for R-abd fluid collection  Central access: PICC placed 07/14/21 TPN start date: 07/14/21  Nutritional Goals:  RD recommendations increased 10/20 Estimated Needs Total Energy Estimated Needs: 2300-2500 Total Protein Estimated Needs: 110-130 grams Total Fluid Estimated Needs: >/= 2.0 L  Current Nutrition:  TPN; NPO  Plan: - Continue TPN cycled over 16 hours (GIR 3.4-6.7 mg/kg/hr) - TPN will provide 125 g AA (63%), 68 g lLE (37%) , and 342 g CHO for total 2348 kCal, meeting 100% of patient needs - Electrolytes in TPN: Na 150 mEq/L, K 40 mEq/L, Mg 8 mEq/L, Phos 12 mmol/L, Ca 5 mEq/L, Cl:Ac 1:1  ( at max K and Mg in TPN ) - MIVF - NS @ 50 mL/hr per surgery - Continue standard MVI and trace elements in TPN - IV thiamine and IV FA (added 10/26) per Psych for worsening mentation and hx ETOH  - Monitor standard TPN labs Mon/Thurs and PRN - Possible trial of diet today  Thank you for involving pharmacy in this patient's care.  11/26, PharmD, BCPS Clinical Pharmacist Clinical phone for 08/14/2021 until 3p is 08/16/2021 08/14/2021 7:16 AM  **Pharmacist phone directory can be found on [email or web address] listed under [facility]**

## 2021-08-14 NOTE — Progress Notes (Signed)
26 Days Post-Op   Subjective/Chief Complaint: NAEO. Wants to eat. States he has been walking.  Objective: Vital signs in last 24 hours: Temp:  [97.5 F (36.4 C)] 97.5 F (36.4 C) (10/31 0841) Pulse Rate:  [66-75] 75 (10/31 0841) Resp:  [17-18] 17 (10/31 0841) BP: (92-99)/(62) 99/62 (10/31 0841) SpO2:  [98 %-99 %] 98 % (10/31 0841) Weight:  [58.3 kg] 58.3 kg (10/31 0615) Last BM Date: 08/06/21  Intake/Output from previous day: 10/30 0701 - 10/31 0700 In: 4284.8 [I.V.:2074.8; IV Piggyback:2200] Out: 2378 [Urine:2375; Drains:3] Intake/Output this shift: No intake/output data recorded.  General appearance: alert and cooperative Resp: clear to auscultation bilaterally Cardio: regular rate and rhythm GI: appropriately tender. RUQ drain in place, clean dressing over LLQ wound where previous drain was.    Lab Results:   BMET Recent Labs    08/13/21 1130 08/14/21 0340  NA 137 136  K 4.6 4.3  CL 102 104  CO2 28 28  GLUCOSE 108* 93  BUN 21* 19  CREATININE 0.65 0.60*  CALCIUM 9.1 9.1   Studies/Results: No results found.  Anti-infectives: Anti-infectives (From admission, onward)    Start     Dose/Rate Route Frequency Ordered Stop   08/03/21 1900  meropenem (MERREM) 1 g in sodium chloride 0.9 % 100 mL IVPB        1 g 200 mL/hr over 30 Minutes Intravenous Every 8 hours 08/03/21 1527     07/18/21 1000  piperacillin-tazobactam (ZOSYN) IVPB 3.375 g  Status:  Discontinued        3.375 g 12.5 mL/hr over 240 Minutes Intravenous Every 8 hours 07/18/21 0839 08/03/21 1527   07/12/21 1200  ceFAZolin (ANCEF) IVPB 2g/100 mL premix        2 g 200 mL/hr over 30 Minutes Intravenous To Short Stay 07/12/21 0843 07/12/21 1100       Assessment/Plan: s/p Procedure(s): EXPLORATORY LAPAROTOMY SMALL BOWEL REPAIR OF PERFORATION AND DRAINAGE OF INTERABDOMINAL CYST (N/A) Continue bowel rest and tpn for nutrition support POD#27 - S/P EXPLORATORY LAPAROTOMY; LYSIS OF ADHESIONS; SMALL  INTESTINE REPAIR - 07/12/21 Dr. Sheliah Hatch for SBO POD#19- S/P LAPAROTOMY, REPAIR OF SMALL BOWEL PERFORATION, PLACEMENT 49F BLAKE DRAIN - 07/19/21 Dr Carolynne Edouard - high output fistula - octreotide (patient has been refusing this medication so D/C-ed 10/28); output seems to be decreasing - continue strict monitoring of output. Plan to start CLD today after LLQ IR drain hopefully replaced. - pelvic abscess drain upsized by IR 10/11, RUQ drain placed by IR 10/18 - IR RUQ drain with 5-15 cc/24h; drain culture with ESBL e coli resistant to zosyn, switched to merrem 10/20 - repeat CT A/P 10/27 w/ resolution of pelvic fluid collection and near complete resolution RUQ collection, unfortunately LLQ drain became dislodged and removed after this scan, appreciate IR evaluation - plan to re-scan and hopefully replace LLQ drain today. - PICC/TPN. NGT out - continue IV abx - OOB, mobilize with PT and staff  - incentive spirometry q 1h - continue PPI  - multimodal pain control - IV tylenol, PRN IV morphine, wean IV morphine   FEN - NPO, PICC/TNA 9/30 >>  VTE - lovenox (has been refusing) ID - ancef x1 dose 9/28 perioperatively, Zosyn 10/4 >> 10/20, Merrem 10/20>>    Foley - removed, voiding well Dispo - med-surg, TPN, IR consult for LLQ drain replacement/RUQ drain removal, trial of clears today    Bipolar disorder Homelessness Polysubstance abuse - psych consulted 10/24 and started IM Zyprexa, appreciate their assistance.  LOS: 35 days    Adam Phenix PA-C 08/14/2021

## 2021-08-14 NOTE — Progress Notes (Signed)
Physical Therapy Treatment Patient Details Name: Thomas [name] MRN: [id number] DOB: 22-Apr-1971 Today's Date: 08/14/2021   History of Present Illness 50 y/o male admitted 9/26 secondary to nausea/vomiting, and abdominal pain. Found to have SBO and had NG tube placed. Pt is s/p lysis of adhesions and small intestine repaire on 9/28. Pt with LLQ abscess and had percutaneous drain placement on 10/4. Pt found to have post op bowel leak and is s/p repair of small bowel perforation on 10/5. Pelvic abscess drain upsized by IR 10/11, RUQ drain placed by IR 10/18.  PMH includes bipolar disorder, tobacco use and substance abuse.    PT Comments    Continuing work on functional mobility and activity tolerance;  Session focused on progressive amb, and pt agreeable to walk; less receptive to cueing for posture; eventually asked not to talk while walking; Still keeps RW too far in front with amb; adjusted height of RW, and am hopeful that will help with RW proximity   Recommendations for follow up therapy are one component of a multi-disciplinary discharge planning process, led by the attending physician.  Recommendations may be updated based on patient status, additional functional criteria and insurance authorization.  Follow Up Recommendations  No PT follow up     Assistance Recommended at Discharge Intermittent Supervision/Assistance  Equipment Recommendations  Rolling walker (2 wheels);3in1 (PT)    Recommendations for Other Services       Precautions / Restrictions Precautions Precautions: Fall Precaution Comments: drains x 2 Restrictions Other Position/Activity Restrictions: impulsive     Mobility  Bed Mobility Overal bed mobility: Needs Assistance Bed Mobility: Rolling;Sidelying to Sit;Sit to Supine Rolling: Supervision Sidelying to sit: Supervision;HOB elevated       General bed mobility comments: angrily getting up without help, assist for IV drain    Transfers Overall  transfer level: Needs assistance Equipment used: Rolling walker (2 wheels) Transfers: Sit to/from Stand Sit to Stand: Min guard           General transfer comment: pulling up on walker so assist for safety and line management    Ambulation/Gait Ambulation/Gait assistance: Min assist Gait Distance (Feet): 150 Feet Assistive device: Rolling walker (2 wheels) Gait Pattern/deviations: Step-through pattern;Decreased stride length;Trunk flexed;Wide base of support     General Gait Details: pushing walker out and staying bent over, assist for keeping walker close and cues for posture, not receptive to cues   Stairs             Wheelchair Mobility    Modified Rankin (Stroke Patients Only)       Balance     Sitting balance-Leahy Scale: Fair       Standing balance-Leahy Scale: Poor                              Cognition Arousal/Alertness: Awake/alert Behavior During Therapy: Impulsive Overall Cognitive Status: No family/caregiver present to determine baseline cognitive functioning                                 General Comments: Not receptable to cues for RW use and posture        Exercises      General Comments        Pertinent Vitals/Pain Pain Assessment: PAINAD Faces Pain Scale: Hurts a little bit Pain Location: abdomen Pain Descriptors / Indicators: Grimacing;Guarding;Discomfort Pain Intervention(s): Monitored during session  Home Living                          Prior Function            PT Goals (current goals can now be found in the care plan section) Acute Rehab PT Goals Patient Stated Goal: none stated PT Goal Formulation: Patient unable to participate in goal setting Time For Goal Achievement: 08/24/21 Potential to Achieve Goals: Good Progress towards PT goals: Progressing toward goals    Frequency    Min 3X/week      PT Plan Current plan remains appropriate    Co-evaluation               AM-PAC PT "6 Clicks" Mobility   Outcome Measure  Help needed turning from your back to your side while in a flat bed without using bedrails?: None Help needed moving from lying on your back to sitting on the side of a flat bed without using bedrails?: None Help needed moving to and from a bed to a chair (including a wheelchair)?: A Little Help needed standing up from a chair using your arms (e.g., wheelchair or bedside chair)?: A Little Help needed to walk in hospital room?: A Little Help needed climbing 3-5 steps with a railing? : A Little 6 Click Score: 20    End of Session Equipment Utilized During Treatment: Other (comment) (PT refused gait belt) Activity Tolerance: Patient tolerated treatment well Patient left: in chair;with call bell/phone within reach;with chair alarm set Nurse Communication: Mobility status PT Visit Diagnosis: Other abnormalities of gait and mobility (R26.89);Difficulty in walking, not elsewhere classified (R26.2);Muscle weakness (generalized) (M62.81)     Time: 7903-8333 PT Time Calculation (min) (ACUTE ONLY): 22 min  Charges:  $Gait Training: 8-22 mins                     Van Clines, [place]  Acute Rehabilitation Services Pager [phone] Office [phone]    Levi Aland 08/14/2021, 2:28 PM

## 2021-08-15 LAB — GLUCOSE, CAPILLARY
Glucose-Capillary: 100 mg/dL — ABNORMAL HIGH (ref 70–99)
Glucose-Capillary: 102 mg/dL — ABNORMAL HIGH (ref 70–99)
Glucose-Capillary: 105 mg/dL — ABNORMAL HIGH (ref 70–99)

## 2021-08-15 MED ORDER — MORPHINE SULFATE (PF) 2 MG/ML IV SOLN
2.0000 mg | INTRAVENOUS | Status: DC | PRN
  Administered 2021-08-15 – 2021-08-16 (×5): 4 mg via INTRAVENOUS
  Filled 2021-08-15 (×5): qty 2

## 2021-08-15 MED ORDER — TRAVASOL 10 % IV SOLN
INTRAVENOUS | Status: AC
  Filled 2021-08-15: qty 1254

## 2021-08-15 MED ORDER — OXYCODONE HCL 5 MG PO TABS
5.0000 mg | ORAL_TABLET | ORAL | Status: DC | PRN
  Administered 2021-08-15 – 2021-08-19 (×9): 5 mg via ORAL
  Filled 2021-08-15 (×10): qty 1

## 2021-08-15 MED ORDER — ACETAMINOPHEN 500 MG PO TABS
1000.0000 mg | ORAL_TABLET | Freq: Three times a day (TID) | ORAL | Status: DC
  Administered 2021-08-15 – 2021-08-19 (×7): 1000 mg via ORAL
  Filled 2021-08-15 (×10): qty 2

## 2021-08-15 NOTE — Progress Notes (Signed)
  Mobility Specialist Criteria Algorithm Info.  Mobility Team: [facility] elevated:Self regulated Activity: Ambulated in hall; Transferred to/from [place] of motion: Active; All extremities Level of assistance: Standby assist, set-up cues, supervision of patient - no hands on Assistive device: Front wheel walker Minutes sitting in chair:  Minutes stood:  Minutes ambulated:  Distance ambulated (ft): 110 ft Mobility response: Tolerated well Bed Position: Semi-fowlers  Patient received lying supine in bed willing and eager to participate in mobility. Requested no assistance stating he was independent. Was supervision to EOB and min guard to stand. Ambulated in hallway supervision level with slow steady gait. Returned to and sat on [facility], In before returning to bed. Tolerated ambulation well without complaint and was left in bed with all needs met.  08/15/2021 4:59 PM

## 2021-08-15 NOTE — Progress Notes (Addendum)
Patient ID: Thomas [name], male   DOB: 19-Aug-1971, 50 y.o.   MRN: [id number] [facility] Surgery Progress Note  27 Days Post-Op  Subjective: CC-  Drank some liquids last night but was told he couldn't have anything yet today. States that he has had no drainage from LLQ previous drain site, and there is no output recorded on his chart. Passing flatus, no BM. Denies n/v.  Objective: Vital signs in last 24 hours: Temp:  [97.8 F (36.6 C)-98.2 F (36.8 C)] 98.1 F (36.7 C) (11/01 0756) Pulse Rate:  [65-84] 78 (11/01 0756) Resp:  [16-19] 19 (11/01 0756) BP: (96-107)/(62-75) 107/63 (11/01 0756) SpO2:  [96 %-99 %] 99 % (11/01 0756) Weight:  [58.1 kg] 58.1 kg (11/01 0430) Last BM Date: 08/06/21  Intake/Output from previous day: 10/31 0701 - 11/01 0700 In: 2772.7 [P.O.:480; I.V.:1492.7; IV Piggyback:800] Out: 710 [Urine:700; Drains:10] Intake/Output this shift: Total I/O In: 240 [P.O.:240] Out: 250 [Urine:250]  PE: General appearance: alert and cooperative Resp: rate and effort normal on room air GI: soft, nondistended, nontender, midline incision healing well with good granulation tissue, RUQ drain in place with serosanguinous fluid in bulb, LLQ wound where previous drain was appears closed and has no drainage  Lab Results:  No results for input(s): WBC, HGB, HCT, PLT in the last 72 hours. BMET Recent Labs    08/13/21 1130 08/14/21 0340  NA 137 136  K 4.6 4.3  CL 102 104  CO2 28 28  GLUCOSE 108* 93  BUN 21* 19  CREATININE 0.65 0.60*  CALCIUM 9.1 9.1   PT/INR No results for input(s): LABPROT, INR in the last 72 hours. CMP     Component Value Date/Time   NA 136 08/14/2021 0340   K 4.3 08/14/2021 0340   CL 104 08/14/2021 0340   CO2 28 08/14/2021 0340   GLUCOSE 93 08/14/2021 0340   BUN 19 08/14/2021 0340   CREATININE 0.60 (L) 08/14/2021 0340   CALCIUM 9.1 08/14/2021 0340   PROT 7.3 08/14/2021 0340   ALBUMIN 2.2 (L) 08/14/2021 0340   AST 16 08/14/2021 0340    ALT 15 08/14/2021 0340   ALKPHOS 89 08/14/2021 0340   BILITOT 0.4 08/14/2021 0340   GFRNONAA >60 08/14/2021 0340   GFRAA >60 09/05/2019 2005   Lipase     Component Value Date/Time   LIPASE 40 07/10/2021 1008       Studies/Results: CT ABDOMEN PELVIS W CONTRAST  Result Date: 08/14/2021 CLINICAL DATA:  Abdominal abscess/infection suspected reaccumulation of pelvic fliud from known small bowel fistula. EXAM: CT ABDOMEN AND PELVIS WITH CONTRAST TECHNIQUE: Multidetector CT imaging of the abdomen and pelvis was performed using the standard protocol following bolus administration of intravenous contrast. CONTRAST:  88mL OMNIPAQUE IOHEXOL 350 MG/ML SOLN COMPARISON:  None. FINDINGS: Lower chest: Bilateral lower lobe subsegmental atelectasis. Hepatobiliary: No focal liver abnormality. No gallstones, gallbladder wall thickening, or pericholecystic fluid. No biliary dilatation. Pancreas: No focal lesion. Normal pancreatic contour. No surrounding inflammatory changes. No main pancreatic ductal dilatation. Spleen: Normal in size without focal abnormality. Adrenals/Urinary Tract: No adrenal nodule bilaterally. Bilateral kidneys enhance symmetrically. No hydronephrosis. No hydroureter. The urinary bladder is unremarkable. Foci of gas noted within the urinary bladder lumen. On delayed imaging, there is no urothelial wall thickening and there are no filling defects in the opacified portions of the bilateral collecting systems or ureters. Stomach/Bowel: PO contrast reaches the rectum. Stomach is within normal limits. No evidence of bowel wall thickening or dilatation. No pneumatosis. The  appendix not definitely identified. Vascular/Lymphatic: No abdominal aorta or iliac aneurysm. Severe atherosclerotic plaque of the aorta and its branches. No abdominal, pelvic, or inguinal lymphadenopathy. Reproductive: Prostate is unremarkable. Other: Right upper abdomen pain tail catheter with tip terminating in the region of  the hepatic flexure. No intraperitoneal free fluid. No intraperitoneal free gas. No organized fluid collection. Musculoskeletal: No abdominal wall hernia or abnormality. Healed anterior abdominal incision No suspicious lytic or blastic osseous lesions. No acute displaced fracture. Straightening of the normal lumbar lordosis likely due to positioning. IMPRESSION: 1. No extravasation of PO contrast or organized fluid collection noted in a patient with known bowel fistula in the right upper quadrant with pigtail catheter terminating around the region of the hepatic flexure. 2. Foci of gas noted within the urinary bladder lumen. Correlate with recent instrumentation. 3.  Aortic Atherosclerosis (ICD10-I70.0). Electronically Signed   By: Tish Frederickson M.D.   On: 08/14/2021 17:57    Anti-infectives: Anti-infectives (From admission, onward)    Start     Dose/Rate Route Frequency Ordered Stop   08/03/21 1900  meropenem (MERREM) 1 g in sodium chloride 0.9 % 100 mL IVPB        1 g 200 mL/hr over 30 Minutes Intravenous Every 8 hours 08/03/21 1527     07/18/21 1000  piperacillin-tazobactam (ZOSYN) IVPB 3.375 g  Status:  Discontinued        3.375 g 12.5 mL/hr over 240 Minutes Intravenous Every 8 hours 07/18/21 0839 08/03/21 1527   07/12/21 1200  ceFAZolin (ANCEF) IVPB 2g/100 mL premix        2 g 200 mL/hr over 30 Minutes Intravenous To Short Stay 07/12/21 0843 07/12/21 1100        Assessment/Plan s/p Procedure(s): EXPLORATORY LAPAROTOMY SMALL BOWEL REPAIR OF PERFORATION AND DRAINAGE OF INTERABDOMINAL CYST (N/A) Continue bowel rest and tpn for nutrition support POD#28 - S/P EXPLORATORY LAPAROTOMY; LYSIS OF ADHESIONS; SMALL INTESTINE REPAIR - 07/12/21 Dr. Sheliah Hatch for SBO POD#20- S/P LAPAROTOMY, REPAIR OF SMALL BOWEL PERFORATION, PLACEMENT 59F BLAKE DRAIN - 07/19/21 Dr Carolynne Edouard - high output fistula - octreotide (patient has been refusing this medication so D/C-ed 10/28); output seems to be decreasing - may  have had no output x24 hours. Trial clear liquids and monitor for any output - pelvic abscess drain upsized by IR 10/11, RUQ drain placed by IR 10/18 - IR RUQ drain with 5-15 cc for several days; drain culture with ESBL e coli resistant to zosyn, switched to merrem 10/20 - repeat CT A/P 10/27 w/ resolution of pelvic fluid collection and near complete resolution RUQ collection, unfortunately LLQ drain became dislodged and removed after this scan - repeat CT A/P 10/31 with no extravasation of PO contrast or organized fluid collection in a patient with known bowel fistula; right upper quadrant with pigtail catheter terminating around the region of the hepatic flexure; Foci of gas noted within the urinary bladder lumen - continue PICC/TPN - continue IV abx - OOB, mobilize with PT and staff  - incentive spirometry q 1h - continue PPI  - multimodal pain control - PO tylenol, IV robaxin, oxy, wean IV morphine   FEN - CLD>>FLD, PICC/TNA 9/30 >>  VTE - lovenox  ID - ancef x1 dose 9/28 perioperatively, Zosyn 10/4 >> 10/20, Merrem 10/20>>day#12 Foley - removed, voiding well  Dispo - RUQ drain per IR - may be able to remove. He has been on appropriate abx for 12 days, will d/c today. Continue TPN. Trial clear liquids today and advance  to full, monitor for LLQ fistula output. Patient states that he will be able to alternate between staying with his niece and his cousin when he leaves the hospital. Hopes to be home by thanksgiving.   Bipolar disorder Homelessness Polysubstance abuse - psych consulted 10/24 and started IM Zyprexa, appreciate their assistance.    LOS: 36 days    Franne Forts, [facility] Surgery 08/15/2021, 10:11 AM Please see Amion for pager number during day hours 7:00am-4:30pm

## 2021-08-15 NOTE — Progress Notes (Signed)
Occupational Therapy Treatment Patient Details Name: Thomas [name] MRN: [id number] DOB: 16-Apr-1971 Today's Date: 08/15/2021   History of present illness 50 y/o male admitted 9/26 secondary to nausea/vomiting, and abdominal pain. Found to have SBO and had NG tube placed. Pt is s/p lysis of adhesions and small intestine repaire on 9/28. Pt with LLQ abscess and had percutaneous drain placement on 10/4. Pt found to have post op bowel leak and is s/p repair of small bowel perforation on 10/5. Pelvic abscess drain upsized by IR 10/11, RUQ drain placed by IR 10/18.  PMH includes bipolar disorder, tobacco use and substance abuse.   OT comments  Upon arrival, pt supine in bed and having large, wet BM. Pt continues to present with decreased cognition, safety, balance, and activity tolerance. Pt requiring Min A for standing balance during LB bathing, peri care, and functional mobility. Pt requiring Max A and cues for management of drain; pt attempting to wrap drain around [facility] at one point to manage himself and Max cues to educate on safety. Continue to recommend dc with no OT follow and downgrade acute OT frequency to 1xweek to facilitate safe dc and optimize independence.    Recommendations for follow up therapy are one component of a multi-disciplinary discharge planning process, led by the attending physician.  Recommendations may be updated based on patient status, additional functional criteria and insurance authorization.    Follow Up Recommendations  No OT follow up    Assistance Recommended at Discharge Intermittent Supervision/Assistance  Equipment Recommendations  None recommended by OT    Recommendations for Other Services PT consult    Precautions / Restrictions Precautions Precautions: Fall Precaution Comments: drains x 1 Restrictions Other Position/Activity Restrictions: impulsive       Mobility Bed Mobility Overal bed mobility: Needs Assistance             General bed  mobility comments: Pt refusing to rolling out of bed as instrcuted by therapist to avoid touching BM. Pt attempting to crawl out of bed on all fours and then requiring to swing hips around and bring feet to floor whiel stabilizing himself on bedrail. Min A for trunk suoppro trhoguhout and safety    Transfers Overall transfer level: Needs assistance Equipment used: None;1 person hand held assist Transfers: Sit to/from Stand Sit to Stand: Min assist           General transfer comment: Refusing walker. Reaching for IV pole and then other items in room to stabilize himself. Min A throughout     Balance Overall balance assessment: Needs assistance Sitting-balance support: No upper extremity supported;Feet supported Sitting balance-Leahy Scale: Good     Standing balance support: Reliant on assistive device for balance;Single extremity supported;During functional activity Standing balance-Leahy Scale: Poor Standing balance comment: Reliant on one UE for support                           ADL either performed or assessed with clinical judgement   ADL Overall ADL's : Needs assistance/impaired             Lower Body Bathing: Minimal assistance;Sit to/from stand Lower Body Bathing Details (indicate cue type and reason): Min A for balance in standing Upper Body Dressing : Minimal assistance;Sitting Upper Body Dressing Details (indicate cue type and reason): donning new gown Lower Body Dressing: Minimal assistance;Sit to/from stand Lower Body Dressing Details (indicate cue type and reason): Min A for standing balance Toilet Transfer: Min  guard;Ambulation;BSC   Toileting- Architect and Hygiene: Min guard;Sit to/from stand;Minimal assistance Toileting - Clothing Manipulation Details (indicate cue type and reason): Min A for standing balance. While urinating at [facility], pt peeing all over the floor     Functional mobility during ADLs: Minimal assistance General ADL  Comments: Pt with BM in bed, cues for mobility to sink to complete sponge bath. Min A for stanging balance throughout     Vision       Perception     Praxis      Cognition Arousal/Alertness: Awake/alert Behavior During Therapy: Impulsive Overall Cognitive Status: No family/caregiver present to determine baseline cognitive functioning                                 General Comments: Upon arrival, pt having BM in bed. Very frustrated this session about BM. Poor safety awareness and problem solving.          Exercises     Shoulder Instructions       General Comments Pt having rubbed abdominal bandage. Notified RN    Pertinent Vitals/ Pain       Pain Assessment: Faces Faces Pain Scale: Hurts a little bit Breathing: normal Pain Location: abdomen Pain Descriptors / Indicators: Grimacing;Guarding;Discomfort Pain Intervention(s): Limited activity within patient's tolerance;Monitored during session;Repositioned  Home Living                                          Prior Functioning/Environment              Frequency  Min 1X/week        Progress Toward Goals  OT Goals(current goals can now be found in the care plan section)  Progress towards OT goals: Progressing toward goals  Acute Rehab OT Goals OT Goal Formulation: With patient Time For Goal Achievement: 08/23/21 Potential to Achieve Goals: Good ADL Goals Pt Will Perform Upper Body Dressing: with set-up;with supervision;sitting Pt Will Perform Lower Body Dressing: with modified independence;sit to/from stand Pt Will Transfer to Toilet: with modified independence;ambulating Additional ADL Goal #1: Pt will perform bed mobility at Superivison level in preparation for ADLs  Plan Discharge plan remains appropriate;Frequency needs to be updated    Co-evaluation                 AM-PAC OT "6 Clicks" Daily Activity     Outcome Measure   Help from another person eating  meals?: Total Help from another person taking care of personal grooming?: A Little Help from another person toileting, which includes using toliet, bedpan, or urinal?: A Little Help from another person bathing (including washing, rinsing, drying)?: A Little Help from another person to put on and taking off regular upper body clothing?: A Little Help from another person to put on and taking off regular lower body clothing?: A Little 6 Click Score: 16    End of Session    OT Visit Diagnosis: Unsteadiness on feet (R26.81);Other abnormalities of gait and mobility (R26.89);Muscle weakness (generalized) (M62.81);Pain Pain - part of body:  (abdomen)   Activity Tolerance Patient tolerated treatment well;Other (comment) (self-limiting)   Patient Left in bed;with call bell/phone within reach;with bed alarm set   Nurse Communication Mobility status        Time: 0160-1093 OT Time Calculation (min): 29 min  Charges: OT  General Charges $OT Visit: 1 Visit OT Treatments $Self Care/Home Management : 23-37 mins  Aubreigh Fuerte MSOT, OTR/L Acute Rehab Pager: [phone] Office: [phone]  Theodoro Grist Kalliopi Coupland 08/15/2021, 3:33 PM

## 2021-08-16 ENCOUNTER — Other Ambulatory Visit (HOSPITAL_COMMUNITY): Payer: Self-pay

## 2021-08-16 LAB — GLUCOSE, CAPILLARY
Glucose-Capillary: 116 mg/dL — ABNORMAL HIGH (ref 70–99)
Glucose-Capillary: 118 mg/dL — ABNORMAL HIGH (ref 70–99)
Glucose-Capillary: 91 mg/dL (ref 70–99)
Glucose-Capillary: 99 mg/dL (ref 70–99)

## 2021-08-16 LAB — LIPASE, BLOOD: Lipase: 55 U/L — ABNORMAL HIGH (ref 11–51)

## 2021-08-16 LAB — AMYLASE: Amylase: 130 U/L — ABNORMAL HIGH (ref 28–100)

## 2021-08-16 MED ORDER — ENSURE ENLIVE PO LIQD
237.0000 mL | Freq: Two times a day (BID) | ORAL | Status: DC
  Administered 2021-08-17 – 2021-08-18 (×3): 237 mL via ORAL

## 2021-08-16 MED ORDER — MORPHINE SULFATE (PF) 2 MG/ML IV SOLN
2.0000 mg | Freq: Four times a day (QID) | INTRAVENOUS | Status: DC | PRN
  Administered 2021-08-16 – 2021-08-17 (×4): 4 mg via INTRAVENOUS
  Filled 2021-08-16 (×4): qty 2

## 2021-08-16 MED ORDER — QUETIAPINE FUMARATE 50 MG PO TABS
200.0000 mg | ORAL_TABLET | Freq: Every day | ORAL | Status: DC
  Administered 2021-08-16 – 2021-08-18 (×3): 200 mg via ORAL
  Filled 2021-08-16 (×3): qty 4

## 2021-08-16 MED ORDER — TRAVASOL 10 % IV SOLN
INTRAVENOUS | Status: AC
  Filled 2021-08-16: qty 1254

## 2021-08-16 NOTE — Progress Notes (Signed)
Patient ID: Thomas [name], male   DOB: September 27, 1971, 50 y.o.   MRN: [id number] [facility] Surgery Progress Note  28 Days Post-Op  Subjective: CC-  Reports 2 BMs yesterday that were pudding consistency, nonbloody. Tolerating FLD without increased abd pain, nausea, or emesis.   Objective: Vital signs in last 24 hours: Temp:  [97.8 F (36.6 C)-98.5 F (36.9 C)] 98.5 F (36.9 C) (11/02 0831) Pulse Rate:  [72-85] 72 (11/02 0831) Resp:  [18-20] 20 (11/02 0831) BP: (96-122)/(57-78) 122/78 (11/02 0831) SpO2:  [97 %-100 %] 100 % (11/02 0831) Weight:  [58.1 kg] 58.1 kg (11/02 0535) Last BM Date: 08/06/21  Intake/Output from previous day: 11/01 0701 - 11/02 0700 In: 1358.1 [P.O.:1120; I.V.:38.1; IV Piggyback:200] Out: 901 [Urine:900; Stool:1] Intake/Output this shift: Total I/O In: -  Out: 300 [Urine:300]  PE: General appearance: alert and cooperative Resp: rate and effort normal on room air GI: soft, nondistended, nontender, midline incision healing well with good granulation tissue, RUQ drain in place with serosanguinous fluid in bulb, LLQ wound where previous drain was appears closed and has no drainage  Lab Results:  No results for input(s): WBC, HGB, HCT, PLT in the last 72 hours. BMET Recent Labs    08/13/21 1130 08/14/21 0340  NA 137 136  K 4.6 4.3  CL 102 104  CO2 28 28  GLUCOSE 108* 93  BUN 21* 19  CREATININE 0.65 0.60*  CALCIUM 9.1 9.1   PT/INR No results for input(s): LABPROT, INR in the last 72 hours. CMP     Component Value Date/Time   NA 136 08/14/2021 0340   K 4.3 08/14/2021 0340   CL 104 08/14/2021 0340   CO2 28 08/14/2021 0340   GLUCOSE 93 08/14/2021 0340   BUN 19 08/14/2021 0340   CREATININE 0.60 (L) 08/14/2021 0340   CALCIUM 9.1 08/14/2021 0340   PROT 7.3 08/14/2021 0340   ALBUMIN 2.2 (L) 08/14/2021 0340   AST 16 08/14/2021 0340   ALT 15 08/14/2021 0340   ALKPHOS 89 08/14/2021 0340   BILITOT 0.4 08/14/2021 0340   GFRNONAA >60  08/14/2021 0340   GFRAA >60 09/05/2019 2005   Lipase     Component Value Date/Time   LIPASE 40 07/10/2021 1008       Studies/Results: CT ABDOMEN PELVIS W CONTRAST  Result Date: 08/14/2021 CLINICAL DATA:  Abdominal abscess/infection suspected reaccumulation of pelvic fliud from known small bowel fistula. EXAM: CT ABDOMEN AND PELVIS WITH CONTRAST TECHNIQUE: Multidetector CT imaging of the abdomen and pelvis was performed using the standard protocol following bolus administration of intravenous contrast. CONTRAST:  55mL OMNIPAQUE IOHEXOL 350 MG/ML SOLN COMPARISON:  None. FINDINGS: Lower chest: Bilateral lower lobe subsegmental atelectasis. Hepatobiliary: No focal liver abnormality. No gallstones, gallbladder wall thickening, or pericholecystic fluid. No biliary dilatation. Pancreas: No focal lesion. Normal pancreatic contour. No surrounding inflammatory changes. No main pancreatic ductal dilatation. Spleen: Normal in size without focal abnormality. Adrenals/Urinary Tract: No adrenal nodule bilaterally. Bilateral kidneys enhance symmetrically. No hydronephrosis. No hydroureter. The urinary bladder is unremarkable. Foci of gas noted within the urinary bladder lumen. On delayed imaging, there is no urothelial wall thickening and there are no filling defects in the opacified portions of the bilateral collecting systems or ureters. Stomach/Bowel: PO contrast reaches the rectum. Stomach is within normal limits. No evidence of bowel wall thickening or dilatation. No pneumatosis. The appendix not definitely identified. Vascular/Lymphatic: No abdominal aorta or iliac aneurysm. Severe atherosclerotic plaque of the aorta and its branches. No abdominal,  pelvic, or inguinal lymphadenopathy. Reproductive: Prostate is unremarkable. Other: Right upper abdomen pain tail catheter with tip terminating in the region of the hepatic flexure. No intraperitoneal free fluid. No intraperitoneal free gas. No organized fluid  collection. Musculoskeletal: No abdominal wall hernia or abnormality. Healed anterior abdominal incision No suspicious lytic or blastic osseous lesions. No acute displaced fracture. Straightening of the normal lumbar lordosis likely due to positioning. IMPRESSION: 1. No extravasation of PO contrast or organized fluid collection noted in a patient with known bowel fistula in the right upper quadrant with pigtail catheter terminating around the region of the hepatic flexure. 2. Foci of gas noted within the urinary bladder lumen. Correlate with recent instrumentation. 3.  Aortic Atherosclerosis (ICD10-I70.0). Electronically Signed   By: Tish Frederickson M.D.   On: 08/14/2021 17:57    Anti-infectives: Anti-infectives (From admission, onward)    Start     Dose/Rate Route Frequency Ordered Stop   08/03/21 1900  meropenem (MERREM) 1 g in sodium chloride 0.9 % 100 mL IVPB  Status:  Discontinued        1 g 200 mL/hr over 30 Minutes Intravenous Every 8 hours 08/03/21 1527 08/15/21 1058   07/18/21 1000  piperacillin-tazobactam (ZOSYN) IVPB 3.375 g  Status:  Discontinued        3.375 g 12.5 mL/hr over 240 Minutes Intravenous Every 8 hours 07/18/21 0839 08/03/21 1527   07/12/21 1200  ceFAZolin (ANCEF) IVPB 2g/100 mL premix        2 g 200 mL/hr over 30 Minutes Intravenous To Short Stay 07/12/21 0843 07/12/21 1100        Assessment/Plan s/p Procedure(s): EXPLORATORY LAPAROTOMY SMALL BOWEL REPAIR OF PERFORATION AND DRAINAGE OF INTERABDOMINAL CYST (N/A) Continue bowel rest and tpn for nutrition support POD#28 - S/P EXPLORATORY LAPAROTOMY; LYSIS OF ADHESIONS; SMALL INTESTINE REPAIR - 07/12/21 Dr. Sheliah Hatch for SBO POD#20- S/P LAPAROTOMY, REPAIR OF SMALL BOWEL PERFORATION, PLACEMENT 70F BLAKE DRAIN - 07/19/21 Dr Carolynne Edouard - high output fistula - octreotide (patient has been refusing this medication so D/C-ed 10/28); output seems to be decreasing/stopped.  - pelvic abscess drain upsized by IR 10/11, RUQ drain placed  by IR 10/18 - IR RUQ drain with 0-15 cc for several days; drain culture with ESBL e coli resistant to zosyn, switched to merrem 10/20 - repeat CT A/P 10/27 w/ resolution of pelvic fluid collection and near complete resolution RUQ collection, unfortunately LLQ drain became dislodged and removed after this scan - repeat CT A/P 10/31 with no extravasation of PO contrast or organized fluid collection in a patient with known bowel fistula; right upper quadrant with pigtail catheter terminating around the region of the hepatic flexure; Foci of gas noted within the urinary bladder lumen - continue PICC/TPN until tolerating SOFT diet for 48 hours without signs of small bowel leak - OOB, mobilize with PT and staff  - incentive spirometry q 1h - continue PPI  - multimodal pain control - PO tylenol, IV robaxin, oxy, wean IV morphine to q6h PRN today from q4h PRN   FEN - FLD >> SOFT, PICC/TNA 9/30 >>  VTE - lovenox  ID - ancef x1 dose 9/28 perioperatively, Zosyn 10/4 >> 10/20, Merrem 10/20-11/1 Foley - removed, voiding well  Dispo - RUQ drain per IR - may be able to remove. Continue TPN. Advance to SOFT diet, monitor for LLQ fistula output. Patient states that he will be able to alternate between staying with his niece and his cousin when he leaves the hospital. Asks that  will give him 24 hours notice prior to discharge.   Bipolar disorder Homelessness Polysubstance abuse - psych consulted 10/24 and started IM Zyprexa, appreciate their assistance. may be able to transition to PO meds soon   LOS: 37 days    Adam Phenix, [facility] Surgery 08/16/2021, 9:10 AM Please see Amion for pager number during day hours 7:00am-4:30pm
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
## 2021-08-16 NOTE — Progress Notes (Signed)
PT Cancellation Note  Patient Details Name: Thomas [name] MRN: [id number] DOB: [birth year]   Cancelled Treatment:    Reason Eval/Treat Not Completed: Other (comment) Pt declined therapy reporting that he walked earlier and does not want to again.  Will f/u at later date.  Did ask about steps and pt reports only 1 to enter niece's house.  He did state he will need a ride home (notified case Production designer, theatre/television/film). Anise Salvo, PT Acute Rehab Services Pager [phone] [facility] [phone]   Rayetta Humphrey 08/16/2021, 3:02 PM
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
## 2021-08-16 NOTE — Progress Notes (Signed)
Mobility Specialist Progress Note:   08/16/21 1200  Mobility  Activity Ambulated in hall  Level of Assistance Contact guard assist, steadying assist  Assistive Device Front wheel walker  Distance Ambulated (ft) 100 ft  Mobility Ambulated with assistance in hallway  Mobility Response Tolerated well  Mobility performed by Mobility specialist  $Mobility charge 1 Mobility   Pt c/o abdominal pain, RN notified. Otherwise asx during ambulation. Sitting EOB eating lunch.  Addison Lank Mobility Specialist  Phone [phone]

## 2021-08-17 ENCOUNTER — Inpatient Hospital Stay (HOSPITAL_COMMUNITY): Payer: Self-pay

## 2021-08-17 HISTORY — PX: IR SINUS/FIST TUBE CHK-NON GI: IMG673

## 2021-08-17 LAB — COMPREHENSIVE METABOLIC PANEL
ALT: 25 U/L (ref 0–44)
AST: 21 U/L (ref 15–41)
Albumin: 2.2 g/dL — ABNORMAL LOW (ref 3.5–5.0)
Alkaline Phosphatase: 88 U/L (ref 38–126)
Anion gap: 5 (ref 5–15)
BUN: 20 mg/dL (ref 6–20)
CO2: 28 mmol/L (ref 22–32)
Calcium: 8.6 mg/dL — ABNORMAL LOW (ref 8.9–10.3)
Chloride: 104 mmol/L (ref 98–111)
Creatinine, Ser: 0.67 mg/dL (ref 0.61–1.24)
GFR, Estimated: >60 mL/min (ref >60)
Glucose, Bld: 102 mg/dL — ABNORMAL HIGH (ref 70–99)
Potassium: 4 mmol/L (ref 3.5–5.1)
Sodium: 137 mmol/L (ref 135–145)
Total Bilirubin: 0.3 mg/dL (ref 0.3–1.2)
Total Protein: 6.7 g/dL (ref 6.5–8.1)

## 2021-08-17 LAB — GLUCOSE, CAPILLARY
Glucose-Capillary: 110 mg/dL — ABNORMAL HIGH (ref 70–99)
Glucose-Capillary: 120 mg/dL — ABNORMAL HIGH (ref 70–99)
Glucose-Capillary: 120 mg/dL — ABNORMAL HIGH (ref 70–99)
Glucose-Capillary: 135 mg/dL — ABNORMAL HIGH (ref 70–99)

## 2021-08-17 LAB — MAGNESIUM: Magnesium: 1.8 mg/dL (ref 1.7–2.4)

## 2021-08-17 LAB — PHOSPHORUS: Phosphorus: 3.4 mg/dL (ref 2.5–4.6)

## 2021-08-17 MED ORDER — MAGNESIUM SULFATE 2 GM/50ML IV SOLN
2.0000 g | Freq: Once | INTRAVENOUS | Status: AC
  Administered 2021-08-17: 2 g via INTRAVENOUS
  Filled 2021-08-17: qty 50

## 2021-08-17 MED ORDER — MORPHINE SULFATE (PF) 2 MG/ML IV SOLN
2.0000 mg | Freq: Two times a day (BID) | INTRAVENOUS | Status: DC | PRN
  Administered 2021-08-17 – 2021-08-18 (×2): 4 mg via INTRAVENOUS
  Filled 2021-08-17 (×2): qty 2

## 2021-08-17 MED ORDER — PANTOPRAZOLE SODIUM 40 MG PO TBEC
40.0000 mg | DELAYED_RELEASE_TABLET | Freq: Every day | ORAL | Status: DC
  Administered 2021-08-17 – 2021-08-18 (×2): 40 mg via ORAL
  Filled 2021-08-17 (×2): qty 1

## 2021-08-17 MED ORDER — THIAMINE HCL 100 MG PO TABS
100.0000 mg | ORAL_TABLET | Freq: Every day | ORAL | Status: DC
  Administered 2021-08-17 – 2021-08-18 (×2): 100 mg via ORAL
  Filled 2021-08-17 (×2): qty 1

## 2021-08-17 MED ORDER — IOHEXOL 300 MG/ML  SOLN
100.0000 mL | Freq: Once | INTRAMUSCULAR | Status: AC | PRN
  Administered 2021-08-17: 5 mL

## 2021-08-17 MED ORDER — FOLIC ACID 1 MG PO TABS
1.0000 mg | ORAL_TABLET | Freq: Every day | ORAL | Status: DC
  Administered 2021-08-17 – 2021-08-18 (×2): 1 mg via ORAL
  Filled 2021-08-17 (×2): qty 1

## 2021-08-17 NOTE — Progress Notes (Signed)
PT Cancellation Note  Patient Details Name: ANDREJ SPAGNOLI MRN: [id number] DOB: 1971/09/06   Cancelled Treatment:    Reason Eval/Treat Not Completed: Other (comment). Pt amb earlier with mobility specialist and will not get up for additional ambulation.    Angelina Ok [facility] 08/17/2021, 4:14 PM Skip Mayer PT Acute Rehabilitation Services Pager [phone] Office [phone]

## 2021-08-17 NOTE — Progress Notes (Signed)
PT Cancellation Note  Patient Details Name: Thomas [name] MRN: [id number] DOB: 1971/04/02   Cancelled Treatment:    Reason Eval/Treat Not Completed: Patient at procedure or test/unavailable   Angelina Ok [facility] 08/17/2021, 11:35 AM Skip Mayer PT Acute Rehabilitation Services Pager [phone] Office [phone]

## 2021-08-17 NOTE — Discharge Instructions (Addendum)
[facility]      [facility] [phone]  OPEN ABDOMINAL SURGERY: POST OP INSTRUCTIONS  Always review your discharge instruction sheet given to you by the facility where your surgery was performed.  IF YOU HAVE DISABILITY OR FAMILY LEAVE FORMS, YOU MUST BRING THEM TO THE OFFICE FOR PROCESSING.  PLEASE DO NOT GIVE THEM TO YOUR DOCTOR.  A prescription for pain medication may be given to you upon discharge.  Take your pain medication as prescribed, if needed.  If narcotic pain medicine is not needed, then you may take acetaminophen (Tylenol) or ibuprofen (Advil) as needed. Take your usually prescribed medications unless otherwise directed. If you need a refill on your pain medication, please contact your pharmacy. They will contact our office to request authorization.  Prescriptions will not be filled after 5pm or on week-ends. You should follow a light diet the first few days after arrival home, such as soup and crackers, pudding, etc.unless your doctor has advised otherwise. A high-fiber, low fat diet can be resumed as tolerated.   Be sure to include lots of fluids daily. Most patients will experience some swelling and bruising on the chest and neck area.  Ice packs will help.  Swelling and bruising can take several days to resolve Most patients will experience some swelling and bruising in the area of the incision. Ice pack will help. Swelling and bruising can take several days to resolve..  It is common to experience some constipation if taking pain medication after surgery.  Increasing fluid intake and taking a stool softener will usually help or prevent this problem from occurring.  A mild laxative (Milk of Magnesia or Miralax) should be taken according to package directions if there are no bowel movements after 48 hours.  You may have steri-strips (small skin tapes) in place directly over the incision.  These strips should be left on the skin for 7-10 days.  If your surgeon used skin  glue on the incision, you may shower in 24 hours.  The glue will flake off over the next 2-3 weeks.  Any sutures or staples will be removed at the office during your follow-up visit. You may find that a light gauze bandage over your incision may keep your staples from being rubbed or pulled. You may shower and replace the bandage daily. ACTIVITIES:  You may resume regular (light) daily activities beginning the next day--such as daily self-care, walking, climbing stairs--gradually increasing activities as tolerated.  You may have sexual intercourse when it is comfortable.  Refrain from any heavy lifting or straining until approved by your doctor. You may drive when you no longer are taking prescription pain medication, you can comfortably wear a seatbelt, and you can safely maneuver your car and apply brakes Return to Work: ___________________________________ You should see your doctor in the office for a follow-up appointment approximately two weeks after your surgery.  Make sure that you call for this appointment within a day or two after you arrive home to insure a convenient appointment time. OTHER INSTRUCTIONS:  _____________________________________________________________ _____________________________________________________________  WHEN TO CALL YOUR DOCTOR: Fever over 101.0 Inability to urinate Nausea and/or vomiting Extreme swelling or bruising Continued bleeding from incision. Increased pain, redness, or drainage from the incision. Difficulty swallowing or breathing Muscle cramping or spasms. Numbness or tingling in hands or feet or around lips.  The clinic staff is available to answer your questions during regular business hours.  Please don't hesitate to call and ask to speak to one of   the nurses if you have concerns.  For further questions, please visit [email or web address]   Psychiatry Outpatient option   Please come to [facility],  during walk in hours for appointment with psychiatrist for further medication management and for therapists for therapy.    Walk in hours are 8-11 AM Monday through Thursday for medication management. Therapy walk in hours are Monday-Wednesday 8 AM-1PM.   It is first come, first -serve; it is best to arrive by 7:00 AM.   On Friday from 1 pm to 4 pm for therapy intake only. Please arrive by 12:00 pm as it is  first come, first -serve.    When you arrive please go upstairs for your appointment. If you are unsure of where to go, inform the front desk that you are here for a walk in appointment and they will assist you with directions upstairs.  Address:  [address] in [place], [address] Ph: [phone]
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
## 2021-08-17 NOTE — Progress Notes (Signed)
Pt refused lovenox injection. On call provider, Angelena Form, MD paged. Will continue to monitor.

## 2021-08-17 NOTE — Progress Notes (Signed)
Patient ID: Thomas [name], male   DOB: 01-23-1971, 50 y.o.   MRN: [id number] [facility] Surgery Progress Note  29 Days Post-Op  Subjective: CC-  Mild abd pain stable, located around RUQ drain. Tolerating PO. Having non-bloody stools.   Objective: Vital signs in last 24 hours: Temp:  [98.2 F (36.8 C)-98.9 F (37.2 C)] 98.4 F (36.9 C) (11/03 0755) Pulse Rate:  [77-100] 77 (11/03 0755) Resp:  [16-18] 18 (11/03 0755) BP: (97-111)/(64-77) 111/77 (11/03 0755) SpO2:  [99 %-100 %] 99 % (11/03 0755) Weight:  [58.6 kg] 58.6 kg (11/03 0802) Last BM Date: 08/16/21  Intake/Output from previous day: 11/02 0701 - 11/03 0700 In: -  Out: 1800 [Urine:1800] Intake/Output this shift: Total I/O In: -  Out: 975 [Urine:975]  PE: General appearance: alert and cooperative Resp: rate and effort normal on room air GI: soft, nondistended, nontender, midline incision healing well with good granulation tissue, RUQ drain in place with serosanguinous fluid in bulb, LLQ wound where previous drain was appears closed and has no drainage  Lab Results:   BMET Recent Labs    08/17/21 0430  NA 137  K 4.0  CL 104  CO2 28  GLUCOSE 102*  BUN 20  CREATININE 0.67  CALCIUM 8.6*   CMP     Component Value Date/Time   NA 137 08/17/2021 0430   K 4.0 08/17/2021 0430   CL 104 08/17/2021 0430   CO2 28 08/17/2021 0430   GLUCOSE 102 (H) 08/17/2021 0430   BUN 20 08/17/2021 0430   CREATININE 0.67 08/17/2021 0430   CALCIUM 8.6 (L) 08/17/2021 0430   PROT 6.7 08/17/2021 0430   ALBUMIN 2.2 (L) 08/17/2021 0430   AST 21 08/17/2021 0430   ALT 25 08/17/2021 0430   ALKPHOS 88 08/17/2021 0430   BILITOT 0.3 08/17/2021 0430   GFRNONAA >60 08/17/2021 0430   GFRAA >60 09/05/2019 2005   Lipase     Component Value Date/Time   LIPASE 55 (H) 08/16/2021 1351       Studies/Results: No results found.  Anti-infectives: Anti-infectives (From admission, onward)    Start     Dose/Rate Route Frequency  Ordered Stop   08/03/21 1900  meropenem (MERREM) 1 g in sodium chloride 0.9 % 100 mL IVPB  Status:  Discontinued        1 g 200 mL/hr over 30 Minutes Intravenous Every 8 hours 08/03/21 1527 08/15/21 1058   07/18/21 1000  piperacillin-tazobactam (ZOSYN) IVPB 3.375 g  Status:  Discontinued        3.375 g 12.5 mL/hr over 240 Minutes Intravenous Every 8 hours 07/18/21 0839 08/03/21 1527   07/12/21 1200  ceFAZolin (ANCEF) IVPB 2g/100 mL premix        2 g 200 mL/hr over 30 Minutes Intravenous To Short Stay 07/12/21 0843 07/12/21 1100        Assessment/Plan s/p Procedure(s): EXPLORATORY LAPAROTOMY SMALL BOWEL REPAIR OF PERFORATION AND DRAINAGE OF INTERABDOMINAL CYST (N/A) Continue bowel rest and tpn for nutrition support POD#28 - S/P EXPLORATORY LAPAROTOMY; LYSIS OF ADHESIONS; SMALL INTESTINE REPAIR - 07/12/21 Dr. Sheliah Hatch for SBO POD#20- S/P LAPAROTOMY, REPAIR OF SMALL BOWEL PERFORATION, PLACEMENT 35F BLAKE DRAIN - 07/19/21 Dr Carolynne Edouard - high output fistula - octreotide (patient has been refusing this medication so D/C-ed 10/28); output seems to be decreasing/stopped.  - pelvic abscess drain upsized by IR 10/11, RUQ drain placed by IR 10/18 - IR RUQ drain with 0-15 cc for several days; drain culture with ESBL e coli  resistant to zosyn, switched to merrem 10/20 - repeat CT A/P 10/27 w/ resolution of pelvic fluid collection and near complete resolution RUQ collection, unfortunately LLQ drain became dislodged and removed after this scan - repeat CT A/P 10/31 with no extravasation of PO contrast or organized fluid collection in a patient with known bowel fistula; right upper quadrant with pigtail catheter terminating around the region of the hepatic flexure; Foci of gas noted within the urinary bladder lumen - SOFT diet  - OOB, mobilize with PT and staff  - incentive spirometry q 1h - continue PPI  - multimodal pain control - PO tylenol, IV robaxin, PRN oxy   FEN - SOFT DIET - wean and stop TPN after  current bag.  VTE - lovenox  ID - ancef x1 dose 9/28 perioperatively, Zosyn 10/4 >> 10/20, Merrem 10/20-11/1 Foley - removed, voiding well  Dispo - RUQ drain per IR - may be able to remove. Discontinue TPN.  Possible discharge this Saturday 11/5    Bipolar disorder Homelessness Polysubstance abuse - psych consulted 10/24 and started IM Zyprexa, now transitioned to PO meds: Seroquel 200mg  QHS, Zyprexa 7.5 mg QHS. Psych to re-evaluate today for final med recommendations   LOS: 38 days    , [facility] Surgery 08/17/2021, 9:48 AM Please see Amion for pager number during day hours 7:00am-4:30pm

## 2021-08-17 NOTE — Progress Notes (Signed)
  Mobility Specialist Criteria Algorithm Info.   Mobility Team: [facility] elevated:Self regulated Activity: Ambulated in hall; Transferred:  Bed to chair Range of motion: Active; All extremities Level of assistance: Standby assist, set-up cues, supervision of patient - no hands on Assistive device: Front wheel walker Minutes sitting in chair:  Minutes stood:  Minutes ambulated: 5 Distance ambulated (ft): 110 ft Mobility response: Tolerated well Bed Position: Chair  Patient received lying supine in bed. Ambulated in hallway with steady gait. Requested to sit in recliner chair upon returning to room. Was left with all needs met and NT present.   08/17/2021 12:36 PM

## 2021-08-17 NOTE — Plan of Care (Signed)

## 2021-08-18 ENCOUNTER — Other Ambulatory Visit (HOSPITAL_COMMUNITY): Payer: Self-pay

## 2021-08-18 LAB — GLUCOSE, CAPILLARY
Glucose-Capillary: 105 mg/dL — ABNORMAL HIGH (ref 70–99)
Glucose-Capillary: 121 mg/dL — ABNORMAL HIGH (ref 70–99)
Glucose-Capillary: 95 mg/dL (ref 70–99)
Glucose-Capillary: 116 mg/dL — ABNORMAL HIGH (ref 70–99)

## 2021-08-18 MED ORDER — QUETIAPINE FUMARATE 200 MG PO TABS
200.0000 mg | ORAL_TABLET | Freq: Every day | ORAL | 0 refills | Status: DC
  Filled 2021-08-18: qty 30, 30d supply, fill #0

## 2021-08-18 MED ORDER — ACETAMINOPHEN 500 MG PO TABS: 1000.0000 mg | ORAL_TABLET | Freq: Three times a day (TID) | ORAL | 0 refills | Status: DC | PRN

## 2021-08-18 MED ORDER — METHOCARBAMOL 500 MG PO TABS
1000.0000 mg | ORAL_TABLET | Freq: Four times a day (QID) | ORAL | Status: DC
  Administered 2021-08-18 – 2021-08-19 (×4): 1000 mg via ORAL
  Filled 2021-08-18 (×4): qty 2

## 2021-08-18 MED ORDER — METHOCARBAMOL 500 MG PO TABS
1000.0000 mg | ORAL_TABLET | Freq: Four times a day (QID) | ORAL | 0 refills | Status: DC | PRN
Start: 2021-08-18 — End: 2023-10-29
  Filled 2021-08-18: qty 60, 8d supply, fill #0

## 2021-08-18 MED ORDER — LIDOCAINE 5 % EX PTCH
1.0000 | MEDICATED_PATCH | CUTANEOUS | 0 refills | Status: DC
  Filled 2021-08-18: qty 30, 30d supply, fill #0

## 2021-08-18 MED ORDER — OXYCODONE HCL 5 MG PO TABS
5.0000 mg | ORAL_TABLET | Freq: Four times a day (QID) | ORAL | 0 refills | Status: DC | PRN
Start: 2021-08-18 — End: 2023-10-29
  Filled 2021-08-18: qty 25, 7d supply, fill #0

## 2021-08-18 MED ORDER — MORPHINE SULFATE (PF) 2 MG/ML IV SOLN
2.0000 mg | INTRAVENOUS | Status: DC | PRN
Start: 2021-08-18 — End: 2021-08-19

## 2021-08-18 NOTE — Progress Notes (Signed)
Procedure was explained to patient by this RN. Patient's RN was in room at bedside during explanation. Patient was instructed to hold his breath during line removal and that he is to remain in bed in supine position for 30 minutes following line removal. Patient's RN was present during this time. HOB was flattened to 0 degrees. PICC line was removed and pressure was held to site for 5 minutes with vaseline gauze, 2x2 gauze and then a tegaderm was applied over that after 5 minutes. Dressing was free from bleeding after 5 minutes. Patient was instructed to leave dressing in place for 24 hours, then do not submerge the site in water for the next 7-10 days. Patient was also instructed to report bleeding, swelling, or any concerns to his RN and to monitor for s/s infection following discharge home. Patient verbalized understanding of instructions.

## 2021-08-18 NOTE — Progress Notes (Signed)
Patient ID: Thomas [name], male   DOB: 09/29/1971, 50 y.o.   MRN: [id number] [facility] Surgery Progress Note  30 Days Post-Op  Subjective: CC-  No complaints today. Asking when he can go home. RUQ drain removed yesterday Tolerating diet. BM yesterday.  Objective: Vital signs in last 24 hours: Temp:  [97.9 F (36.6 C)-98.6 F (37 C)] 97.9 F (36.6 C) (11/04 0850) Pulse Rate:  [71-97] 71 (11/04 0850) Resp:  [17-18] 18 (11/04 0850) BP: (97-116)/(56-85) 97/64 (11/04 0850) SpO2:  [97 %-100 %] 100 % (11/04 0850) Weight:  [57.9 kg] 57.9 kg (11/04 0538) Last BM Date: 08/16/21  Intake/Output from previous day: 11/03 0701 - 11/04 0700 In: 6193.8 [P.O.:1080; I.V.:4463.8; IV Piggyback:650] Out: 3225 [Urine:3225] Intake/Output this shift: No intake/output data recorded.  PE: General appearance: alert and cooperative Resp: rate and effort normal on room air GI: soft, nondistended, nontender, midline incision healing well with good granulation tissue, cdi dressing over previous RUQ drain site, LLQ wound where previous drain was appears closed and has no drainage  Lab Results:  No results for input(s): WBC, HGB, HCT, PLT in the last 72 hours. BMET Recent Labs    08/17/21 0430  NA 137  K 4.0  CL 104  CO2 28  GLUCOSE 102*  BUN 20  CREATININE 0.67  CALCIUM 8.6*   PT/INR No results for input(s): LABPROT, INR in the last 72 hours. CMP     Component Value Date/Time   NA 137 08/17/2021 0430   K 4.0 08/17/2021 0430   CL 104 08/17/2021 0430   CO2 28 08/17/2021 0430   GLUCOSE 102 (H) 08/17/2021 0430   BUN 20 08/17/2021 0430   CREATININE 0.67 08/17/2021 0430   CALCIUM 8.6 (L) 08/17/2021 0430   PROT 6.7 08/17/2021 0430   ALBUMIN 2.2 (L) 08/17/2021 0430   AST 21 08/17/2021 0430   ALT 25 08/17/2021 0430   ALKPHOS 88 08/17/2021 0430   BILITOT 0.3 08/17/2021 0430   GFRNONAA >60 08/17/2021 0430   GFRAA >60 09/05/2019 2005   Lipase     Component Value Date/Time    LIPASE 55 (H) 08/16/2021 1351       Studies/Results: IR Sinus/Fist Tube Chk-Non GI  Result Date: 08/17/2021 INDICATION: 50 year old male with postoperative fluid collection in the right hemiabdomen concerning abscess. He underwent percutaneous drain placement 08/01/2021. Subsequent follow-up imaging on 08/14/2021 demonstrates resolution of the abscess. He presents today for drain injection prior to removal to exclude the possibility of fistula. EXAM: SINUS TRACT INJECTION / FISTULOGRAM COMPARISON:  None. MEDICATIONS: None. ANESTHESIA/SEDATION: None. COMPLICATIONS: None immediate. TECHNIQUE: A hand injection of contrast material opacifies a collapsed abscess cavity. No evidence of fistula. Injected contrast material tracks back along the tube to the skin surface. PROCEDURE: A hand injection of contrast material opacifies a collapsed abscess cavity. No evidence of fistula. Injected contrast material tracks back along the tube to the skin surface. The retention suture was cut.  The tube was transected and removed. IMPRESSION: 1. No evidence of residual abscess cavity or fistula. 2. Removal of percutaneous drainage catheter. Electronically Signed   By: Malachy Moan M.D.   On: 08/17/2021 16:50    Anti-infectives: Anti-infectives (From admission, onward)    Start     Dose/Rate Route Frequency Ordered Stop   08/03/21 1900  meropenem (MERREM) 1 g in sodium chloride 0.9 % 100 mL IVPB  Status:  Discontinued        1 g 200 mL/hr over 30 Minutes Intravenous Every  8 hours 08/03/21 1527 08/15/21 1058   07/18/21 1000  piperacillin-tazobactam (ZOSYN) IVPB 3.375 g  Status:  Discontinued        3.375 g 12.5 mL/hr over 240 Minutes Intravenous Every 8 hours 07/18/21 0839 08/03/21 1527   07/12/21 1200  ceFAZolin (ANCEF) IVPB 2g/100 mL premix        2 g 200 mL/hr over 30 Minutes Intravenous To Short Stay 07/12/21 0843 07/12/21 1100        Assessment/Plan POD#29 - S/P EXPLORATORY LAPAROTOMY; LYSIS OF  ADHESIONS; SMALL INTESTINE REPAIR - 07/12/21 Dr. Sheliah Hatch for SBO POD#21- S/P LAPAROTOMY, REPAIR OF SMALL BOWEL PERFORATION, PLACEMENT 84F BLAKE DRAIN - 07/19/21 Dr Carolynne Edouard - pelvic abscess drain upsized by IR 10/11, RUQ drain placed by IR 10/18 - drain culture with ESBL e coli resistant to zosyn, switched to merrem 10/20 and completed abx course.  - repeat CT A/P 10/27 w/ resolution of pelvic fluid collection and near complete resolution RUQ collection, unfortunately LLQ drain became dislodged and removed after this scan - repeat CT A/P 10/31 with no extravasation of PO contrast or organized fluid collection in a patient with known bowel fistula; right upper quadrant with pigtail catheter terminating around the region of the hepatic flexure; Foci of gas noted within the urinary bladder lumen - RUQ drain removed 11/3 - LLQ - fistula seems to have closed - SOFT diet  - OOB, mobilize with PT and staff  - incentive spirometry q 1h - continue PPI  - multimodal pain control - PO tylenol, PO robaxin, PRN oxy   FEN - SOFT DIET - off TPN VTE - lovenox  ID - ancef x1 dose 9/28 perioperatively, Zosyn 10/4 >> 10/20, Merrem 10/20-11/1 Foley - removed, voiding well   Dispo - Plan for discharge tomorrow. Will send meds to [facility] pharmacy. D/c PICC. Social work to help patient with transportation to [employer].  RUQ drain per IR - may be able to remove. Discontinue TPN.  Possible discharge this Saturday 11/5    Bipolar disorder Homelessness Polysubstance abuse - psych consulted 10/24 and started IM Zyprexa, now transitioned to PO meds: Seroquel 200mg  QHS, Zyprexa 7.5 mg QHS. Psych to re-evaluate 11/3 - plan outpatient follow up at [facility]   LOS: 39 days    [facility], [facility] Surgery 08/18/2021, 11:15 AM Please see Amion for pager number during day hours 7:00am-4:30pm

## 2021-08-18 NOTE — Progress Notes (Signed)
Physical Therapy Discharge Patient Details Name: Thomas [name] MRN: [id number] DOB: 1970/10/29 Today's Date: 08/18/2021 Time:  -     Patient discharged from PT services secondary to patient has refused 3 (three) consecutive times without medical reason.  Please see latest therapy progress note for current level of functioning and progress toward goals.    Progress and discharge plan discussed with patient and/or caregiver:  Understands he can continue to mobilize with mobility team.  GP   Malyk Girouard A. Chanell Nadeau, PT, DPT Acute Rehabilitation Services Office: [phone]   Alizandra Loh A Myrla Malanowski 08/18/2021, 9:46 AM

## 2021-08-18 NOTE — TOC Initial Note (Addendum)
Transition of Care [facility]) - Initial/Assessment Note    Patient Details  Name: Thomas [name] MRN: [id number] Date of Birth: 06-06-1971  Transition [facility]) CM/SW Contact:    Kingsley Plan, RN Phone Number: 08/18/2021, 11:29 AM  Clinical Narrative:                 Spoke to patient at bedside.   Plan to discharge to his cousin's home 08/19/21 Address: [address] [place]. He will need [facility] [employer] home. NCM provided patient with waiver. Patient signed waiver and NCM faxed waiver and placed waiver in shadow chart.   Patient and PT requesting Rollator , NCM ordered same through [employer] , they will discuss payment directly with patient.   PA sent scripts to [facility] pharmacy today , will assist with MATCH , he can afford co pay $3 / script.    Patient has been taught wound care. NCM asked nurse to provide some supplies. NCM also asked nurse for wound description and a detailed list of supplies . Once received NCM will order through [employer]    Patient has a hospital follow up with [facility] Passe, information placed on AVS   Patient voiced understanding to all of above.    1400 received wound description and supply list. Ordered supplies with Velna Hatchet with [employer]   1430 ordered rollator  patient cannot provide credit card to set up payment plan, ordered rolling walker [employer] will provide walker and 3 in 1 through charity.  Expected Discharge Plan: Home/Self Care Barriers to Discharge: Continued Medical Work up   Patient Goals and CMS Choice Patient states their goals for this hospitalization and ongoing recovery are:: to go home CMS Medicare.gov Compare Post Acute Care list provided to:: Patient    Expected Discharge Plan and Services Expected Discharge Plan: Home/Self Care In-house Referral: Financial Counselor Discharge Planning Services: Indigent Health Clinic, MATCH Program, Medication Assistance   Living arrangements for the past 2  months: Single Family Home                 DME Arranged: Walker rolling with seat DME Agency: [employer] Date DME Agency Contacted: 08/18/21 Time DME Agency Contacted: 1129              Prior Living Arrangements/Services Living arrangements for the past 2 months: Single Family Home Lives with:: Other (Comment) (cousin) Patient language and need for interpreter reviewed:: Yes Do you feel safe going back to the place where you live?: Yes      Need for Family Participation in Patient Care: Yes (Comment) Care giver support system in place?: Yes (comment)   Criminal Activity/Legal Involvement Pertinent to Current Situation/Hospitalization: No - Comment as needed  Activities of Daily Living Home Assistive Devices/Equipment: None ADL Screening (condition at time of admission) Patient's cognitive ability adequate to safely complete daily activities?: Yes Is the patient deaf or have difficulty hearing?: No Does the patient have difficulty seeing, even when wearing glasses/contacts?: No Does the patient have difficulty concentrating, remembering, or making decisions?: No Patient able to express need for assistance with ADLs?: Yes Does the patient have difficulty dressing or bathing?: No Independently performs ADLs?: Yes (appropriate for developmental age) Does the patient have difficulty walking or climbing stairs?: No Weakness of Legs: None Weakness of Arms/Hands: None  Permission Sought/Granted Permission sought to share information with : Family Supports Permission granted to share information with : No  Share Information with NAME: [place]     Permission granted  to share info w Relationship: Niece  Permission granted to share info w Contact Information: [phone]  Emotional Assessment Appearance:: Appears stated age Attitude/Demeanor/Rapport: Engaged Affect (typically observed): Accepting Orientation: : Oriented to Self, Oriented to Place, Oriented to  Time, Oriented to  Situation Alcohol / Substance Use: Not Applicable Psych Involvement: No (comment)  Admission diagnosis:  Dehydration [E86.0] SBO (small bowel obstruction) (HCC) [K56.609] Non-intractable vomiting with nausea, unspecified vomiting type [R11.2] Patient Active Problem List   Diagnosis Date Noted   Moderate bipolar I disorder, most recent episode depressed (HCC)    Protein-calorie malnutrition, severe 07/15/2021   SBO (small bowel obstruction) (HCC) 07/10/2021   Major depressive disorder, recurrent, severe without psychotic features (HCC) 02/02/2019   MDD (major depressive disorder), recurrent episode (HCC) 02/02/2019   Homicidal ideation    Alcohol abuse with intoxication, uncomplicated (HCC) 04/20/2015   Substance induced mood disorder (HCC) 03/21/2015   Suicide attempt (HCC)    Alcohol abuse 03/12/2012   Cocaine abuse (HCC) 03/12/2012   PCP:  Patient, No Pcp Per (Inactive) Pharmacy:   [id number] [place], [place] - [address] [place] [address] Phone: [phone] Fax: [phone]  [facility] [address] [place] [address] Phone: [phone] Fax: [phone]     Social Determinants of Health (SDOH) Interventions    Readmission Risk Interventions No flowsheet data found.

## 2021-08-18 NOTE — Progress Notes (Signed)
Pt irritable upon being awoken for medications. Refused CBG and vital signs this am. As well as wound care changes. Compliant with po medications after informed that this writer would be leaving shortly and could have to possibly delay pain meds.

## 2021-08-18 NOTE — Progress Notes (Signed)
PT Cancellation Note  Patient Details Name: Thomas [name] MRN: [id number] DOB: 24-Apr-1971   Cancelled Treatment:    Reason Eval/Treat Not Completed: Pain limiting ability to participate;Patient declined, no reason specified (Pt. initially willing to work with PT, then becomes agitated and refuses.  Has been moving well with mobility team (amb 100+ ft with mod I) and has refused PT 2 other times.  Will be D/C from caseload, but please reconsult if needed.)  Linday Rhodes A. Logon Uttech, PT, DPT Acute Rehabilitation Services Office: [phone]   Karene Bracken A Jenayah Antu 08/18/2021, 9:46 AM

## 2021-08-19 LAB — GLUCOSE, CAPILLARY: Glucose-Capillary: 87 mg/dL (ref 70–99)

## 2021-08-19 NOTE — Progress Notes (Signed)
Pt discharged. [facility] SW provided cab voucher for pt transport to family members private resident

## 2021-08-19 NOTE — Progress Notes (Signed)
Discharge instructions reviewed and given to pt, pt acknowledge understanding. 6 filled meds from [facility] pharmacy and 3 in 1 and rollator walker given to pt at discharge.

## 2021-09-19 ENCOUNTER — Inpatient Hospital Stay (INDEPENDENT_AMBULATORY_CARE_PROVIDER_SITE_OTHER): Payer: Self-pay | Admitting: Primary Care

## 2021-10-22 ENCOUNTER — Emergency Department (HOSPITAL_COMMUNITY)
Admission: EM | Admit: 2021-10-22 | Discharge: 2021-10-22 | Disposition: A | Discharge status: Home / Self Care | Payer: Self-pay | Attending: Emergency Medicine | Admitting: Emergency Medicine

## 2021-10-22 ENCOUNTER — Other Ambulatory Visit: Payer: Self-pay

## 2021-10-22 ENCOUNTER — Emergency Department (HOSPITAL_COMMUNITY): Payer: Self-pay

## 2021-10-22 DIAGNOSIS — G8918 Other acute postprocedural pain: Secondary | ICD-10-CM | POA: Insufficient documentation

## 2021-10-22 DIAGNOSIS — R1032 Left lower quadrant pain: Secondary | ICD-10-CM | POA: Insufficient documentation

## 2021-10-22 DIAGNOSIS — Z79899 Other long term (current) drug therapy: Secondary | ICD-10-CM | POA: Insufficient documentation

## 2021-10-22 LAB — CBC WITH DIFFERENTIAL/PLATELET
Abs Immature Granulocytes: 0.03 10*3/uL (ref 0.00–0.07)
Basophils Absolute: 0 10*3/uL (ref 0.0–0.1)
Basophils Relative: 1 %
Eosinophils Absolute: 0.1 10*3/uL (ref 0.0–0.5)
Eosinophils Relative: 2 %
HCT: 39.3 % (ref 39.0–52.0)
Hemoglobin: 12.3 g/dL — ABNORMAL LOW (ref 13.0–17.0)
Immature Granulocytes: 1 %
Lymphocytes Relative: 33 %
Lymphs Abs: 2 10*3/uL (ref 0.7–4.0)
MCH: 28.1 pg (ref 26.0–34.0)
MCHC: 31.3 g/dL (ref 30.0–36.0)
MCV: 89.7 fL (ref 80.0–100.0)
Monocytes Absolute: 0.5 10*3/uL (ref 0.1–1.0)
Monocytes Relative: 8 %
Neutro Abs: 3.3 10*3/uL (ref 1.7–7.7)
Neutrophils Relative %: 55 %
Platelets: 338 10*3/uL (ref 150–400)
RBC: 4.38 MIL/uL (ref 4.22–5.81)
RDW: 14.4 % (ref 11.5–15.5)
WBC: 5.9 10*3/uL (ref 4.0–10.5)
nRBC: 0 % (ref 0.0–0.2)

## 2021-10-22 LAB — COMPREHENSIVE METABOLIC PANEL
ALT: 14 U/L (ref 0–44)
AST: 13 U/L — ABNORMAL LOW (ref 15–41)
Albumin: 3.1 g/dL — ABNORMAL LOW (ref 3.5–5.0)
Alkaline Phosphatase: 80 U/L (ref 38–126)
Anion gap: 7 (ref 5–15)
BUN: 8 mg/dL (ref 6–20)
CO2: 26 mmol/L (ref 22–32)
Calcium: 8.7 mg/dL — ABNORMAL LOW (ref 8.9–10.3)
Chloride: 108 mmol/L (ref 98–111)
Creatinine, Ser: 0.87 mg/dL (ref 0.61–1.24)
GFR, Estimated: >60 mL/min (ref >60)
Glucose, Bld: 92 mg/dL (ref 70–99)
Potassium: 3.5 mmol/L (ref 3.5–5.1)
Sodium: 141 mmol/L (ref 135–145)
Total Bilirubin: 0.5 mg/dL (ref 0.3–1.2)
Total Protein: 7.2 g/dL (ref 6.5–8.1)

## 2021-10-22 MED ORDER — BACITRACIN ZINC 500 UNIT/GM EX OINT
TOPICAL_OINTMENT | Freq: Two times a day (BID) | CUTANEOUS | Status: DC
  Administered 2021-10-22: 4 via TOPICAL
  Filled 2021-10-22: qty 2.7
  Filled 2021-10-22: qty 0.9

## 2021-10-22 MED ORDER — IOHEXOL 350 MG/ML SOLN
80.0000 mL | Freq: Once | INTRAVENOUS | Status: AC | PRN
  Administered 2021-10-22: 80 mL via INTRAVENOUS
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
## 2021-10-22 NOTE — ED Notes (Signed)
Patient transported to CT 

## 2021-10-22 NOTE — ED Triage Notes (Signed)
Pt reported to ED with c/o staple sticking out from abdominal surgical site as well as bleeding to area that is now controlled. Pt unable to name type of surgery performed, states "it was on my intestines". Also noted to be eating during time of triage with bottle of ibuprofen in his hand that he states "he takes for pain".

## 2021-10-22 NOTE — ED Notes (Addendum)
PT refused vitals. 7:16 am

## 2021-10-22 NOTE — ED Notes (Addendum)
PT refused vitals 8:18am

## 2021-10-22 NOTE — Discharge Instructions (Addendum)
You will need to follow-up with the surgeon who did your procedure in the hospital a month ago.  We removed a small staple from your wound stitch from your wound.  Your blood test and your CT scan were reassuring, did not show signs of blood loss, infection in the abdomen, or other complications.  You can apply bacitracin ointment twice a day to your wound, and change bandages once daily.   You were scheduled originally to see your surgeon 2 weeks after being discharged from the hospital.  It is very important that you follow-up in the office.  Please contact the number below and ask for the next available appointment.   Kinsinger, De Blanch, MD.   Specialty: General Surgery  Contact information: [address] [place] [address] [place] [address] [phone]

## 2021-10-22 NOTE — ED Provider Triage Note (Signed)
Emergency Medicine Provider Triage Evaluation Note  Thomas [name] , a 51 y.o. male  was evaluated in triage.  Pt complains of abdominal pain.  Patient had exploratory laparotomy with small intestine repair on September 28 with an additional laparotomy and small bowel repair of perforation and drainage of intra-abdominal cyst on October 5.  Patient states that he has been changing the dressings on his abdomen.  He states he has a follow-up with surgery next month.  Earlier this morning he began developing abdominal pain once again.  Reports a possible staple in the abdomen that is loose.  No fevers, chills, nausea, vomiting, diarrhea.  Physical Exam  BP (!) 124/102 (BP Location: Right Arm)    Pulse (!) 106    Temp 98.4 F (36.9 C)    Resp 16    Ht 5\' 9"  (1.753 m)    Wt 57.7 kg    SpO2 98%    BMI 18.78 kg/m  Gen:   Awake, no distress   Resp:  Normal effort  MSK:   Moves extremities without difficulty  Other:  Well-healing surgical scar noted to the abdomen.  Abdomen is flat and soft.  Mild tenderness noted in the central abdomen.  Overlying skin irritation around the surgical site.  Visible suture central to the scar.  No staples.  Medical Decision Making  Medically screening exam initiated at 2:58 AM.  Appropriate orders placed.  Thomas [name] was informed that the remainder of the evaluation will be completed by another provider, this initial triage assessment does not replace that evaluation, and the importance of remaining in the ED until their evaluation is complete.   Thomas Chu, PA-C 10/22/21 0300

## 2021-10-22 NOTE — ED Notes (Signed)
Patient refusing vitals at this time.

## 2022-10-18 IMAGING — CT CT ABD-PELV W/ CM
2 of 5 series · 15 of 46 positions shown, 17 images · IV contrast (APPLIED)
Comparison: 08/14/2021

CLINICAL DATA: Abdominal pain. Known postoperative fluid collection
in the right abdomen status post percutaneous drain placement
08/01/2021 with drain removal Crys 322.

EXAM:
CT ABDOMEN AND PELVIS WITH CONTRAST
TECHNIQUE: Multidetector CT imaging of the abdomen and pelvis was performed
using the standard protocol following bolus administration of
intravenous contrast.
CONTRAST:  80mL OMNIPAQUE IOHEXOL 350 MG/ML SOLN

[Series 3: abdomen 5.0 · axial · 0.82mm/px · z∈[+736,+1156]mm · 12 of 98 slices shown, 14 images]
[im 7/98  soft-tissue]
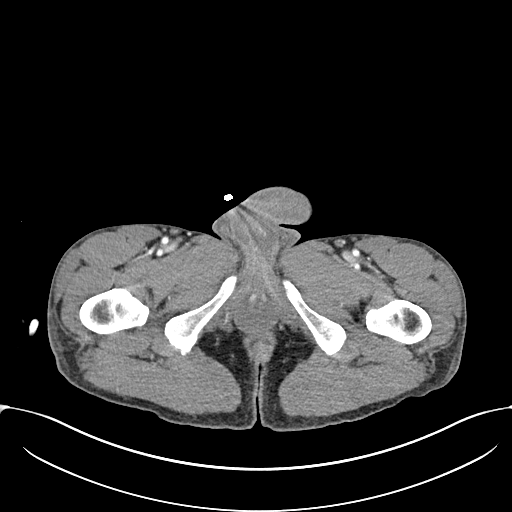
[im 7/98  bone]
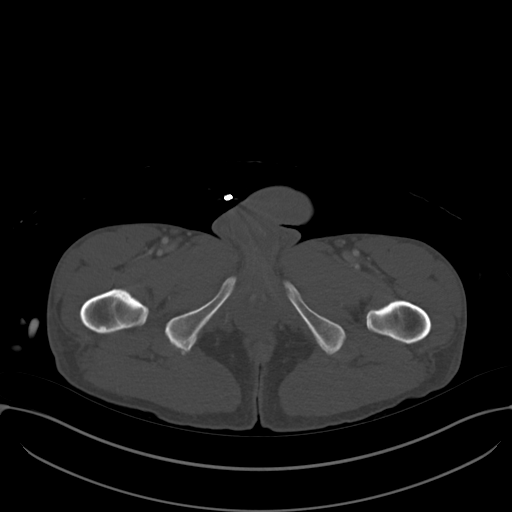
[im 13/98  soft-tissue]
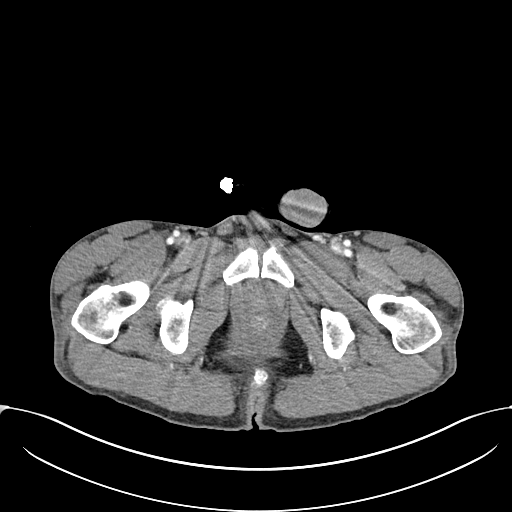
[im 20/98  soft-tissue]
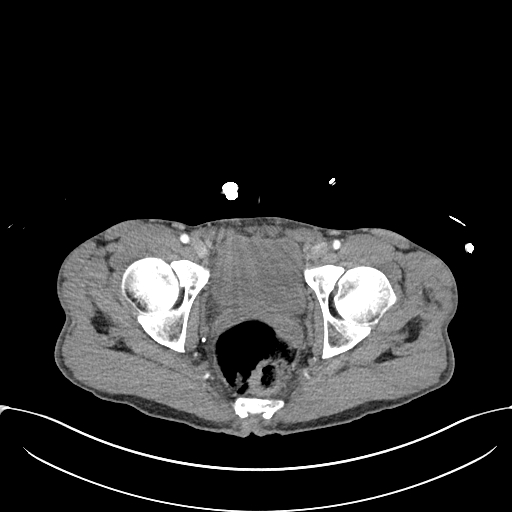
[im 33/98  soft-tissue]
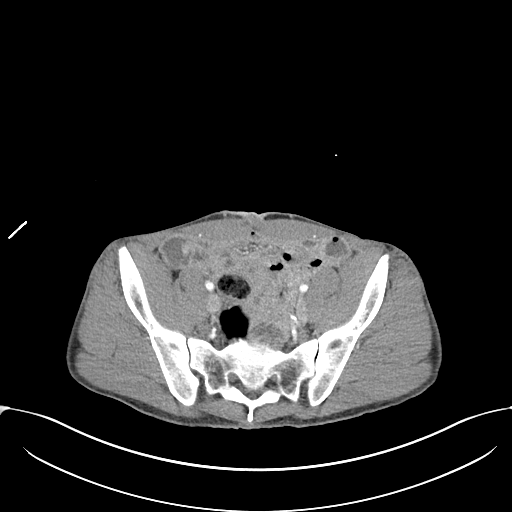
[im 39/98  soft-tissue]
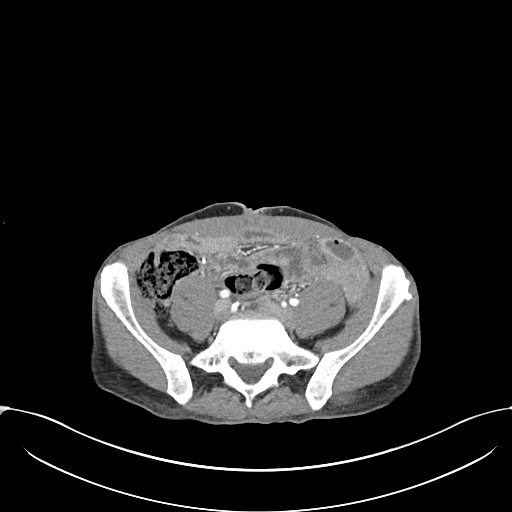
[im 46/98  soft-tissue]
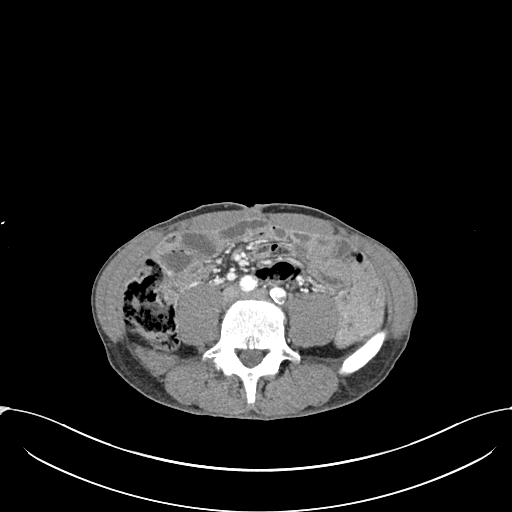
[im 52/98  soft-tissue]
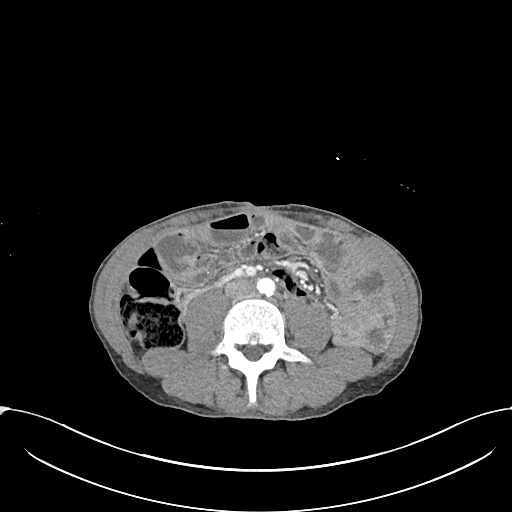
[im 59/98  soft-tissue]
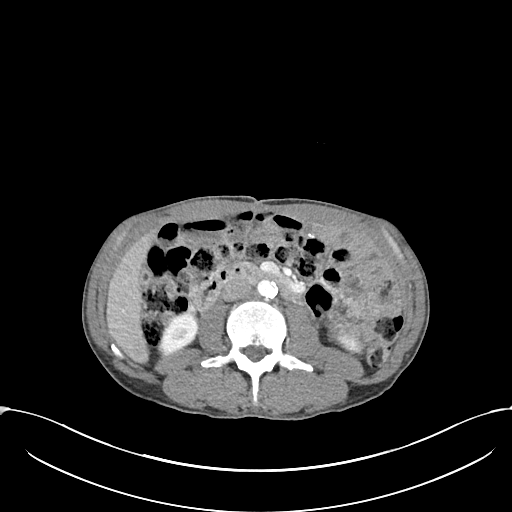
[im 65/98  soft-tissue]
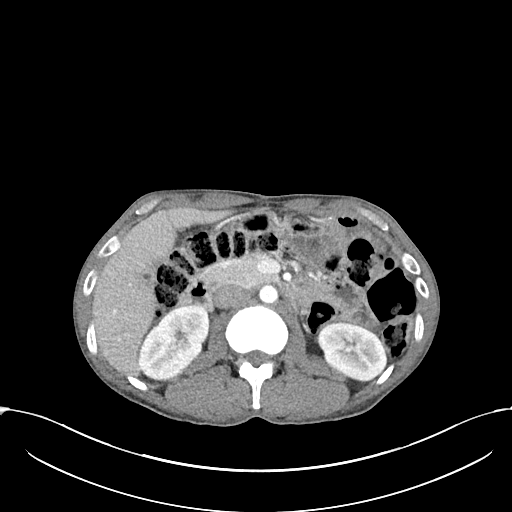
[im 65/98  bone]
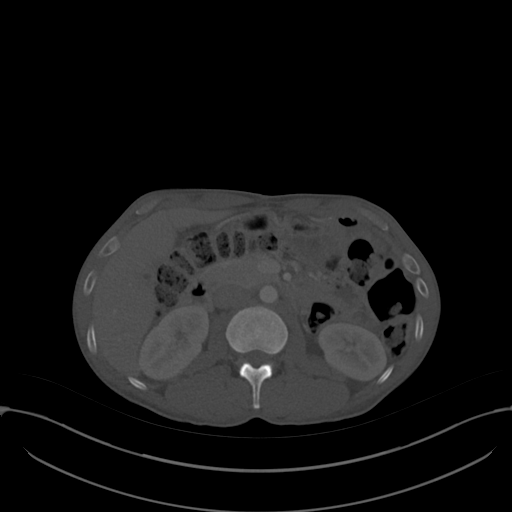
[im 78/98  soft-tissue]
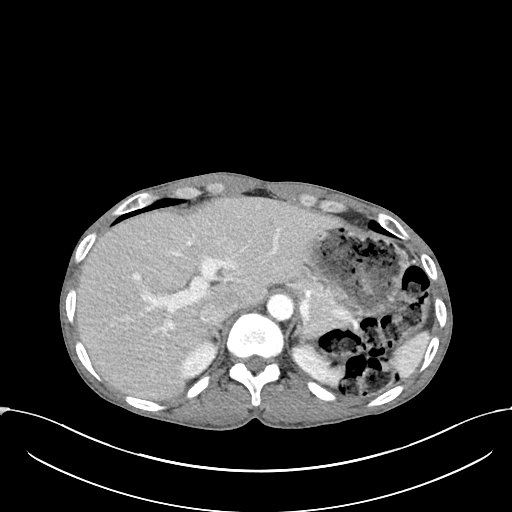
[im 85/98  soft-tissue]
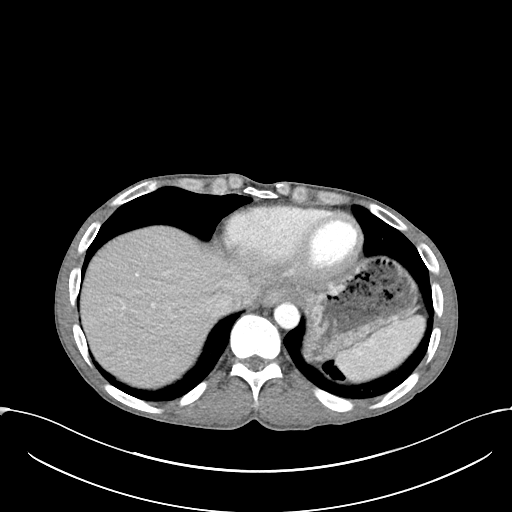
[im 91/98  soft-tissue]
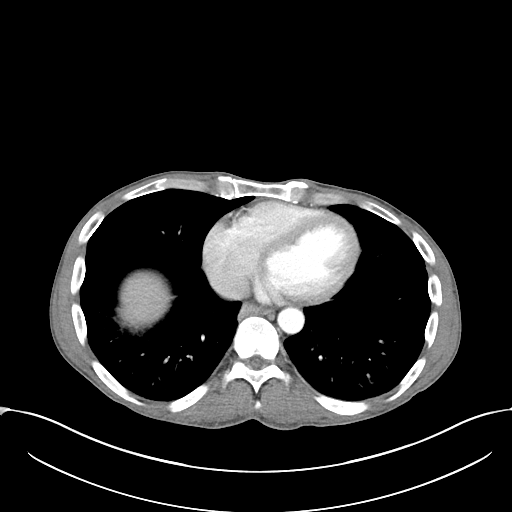

[Series 6: abdomen 3.0 mpr cor · coronal · 0.59mm/px · 3 of 74 slices shown]
[im 25/74  soft-tissue]
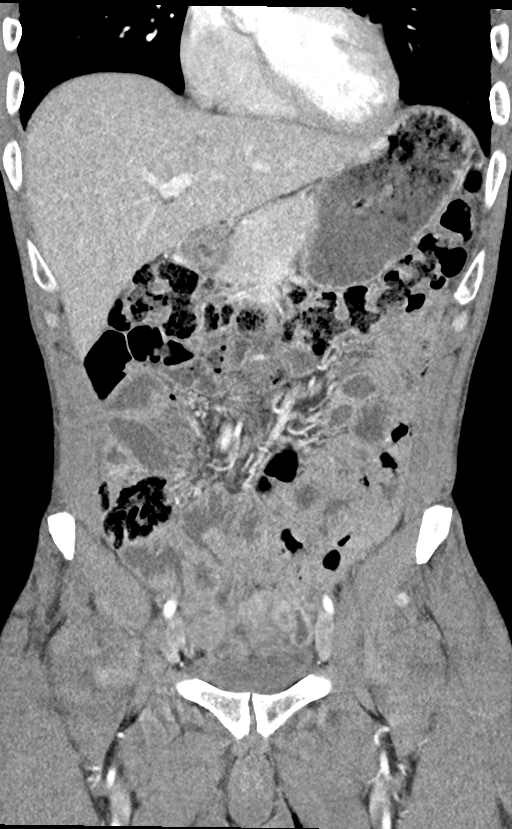
[im 33/74  soft-tissue]
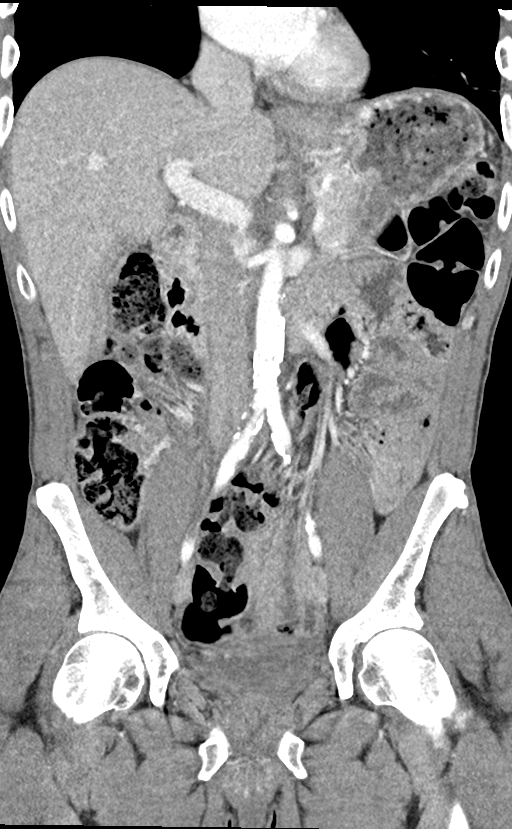
[im 41/74  soft-tissue]
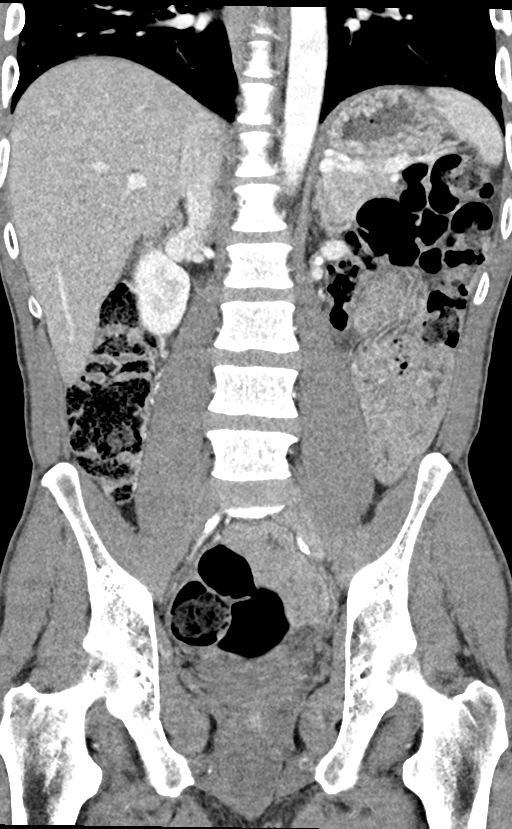

[15 of 46 positions shown; findings below may reference images not displayed]

FINDINGS: Lower chest: Dependent atelectasis in the lung bases.

Hepatobiliary: Tiny hypodensities in the dome of the liver are
stable in the interval, too small to characterize but likely benign.
Gallbladder is decompressed. No intrahepatic or extrahepatic biliary
dilation.

Pancreas: No focal mass lesion. No dilatation of the main duct. No
intraparenchymal cyst. No peripancreatic edema.

Spleen: No splenomegaly. No focal mass lesion.

Adrenals/Urinary Tract: No adrenal nodule or mass. Kidneys
unremarkable. No evidence for hydroureter. The urinary bladder
appears normal for the degree of distention.

Stomach/Bowel: Stomach is unremarkable. No gastric wall thickening.
No evidence of outlet obstruction. Duodenum is normally positioned
as is the ligament of Treitz. No small bowel wall thickening. No
small bowel dilatation. Terminal ileum is not discretely visible.
The appendix is not well visualized, but there is no edema or
inflammation in the region of the cecum. No gross colonic mass. No
colonic wall thickening.

Vascular/Lymphatic: There is mild atherosclerotic calcification of
the abdominal aorta without aneurysm. There is no gastrohepatic or
hepatoduodenal ligament lymphadenopathy. No retroperitoneal or
mesenteric lymphadenopathy. No pelvic sidewall lymphadenopathy.

Reproductive: The prostate gland and seminal vesicles are
unremarkable.

Other: No intraperitoneal free fluid. No evidence for fluid
collection in the right upper quadrant, at the site of prior
percutaneous drain placement.

Musculoskeletal: No worrisome lytic or sclerotic osseous
abnormality.
IMPRESSION: No acute findings in the abdomen or pelvis.

No evidence for fluid collection in the right upper quadrant, at the
site of prior percutaneous drain placement.

Aortic Atherosclerosis ([id number]-[id number].E).

## 2023-10-21 ENCOUNTER — Emergency Department (HOSPITAL_COMMUNITY)
Admission: EM | Admit: 2023-10-21 | Discharge: 2023-10-22 | Disposition: A | Discharge status: Home / Self Care | Payer: Self-pay | Attending: Emergency Medicine | Admitting: Emergency Medicine

## 2023-10-21 ENCOUNTER — Encounter (HOSPITAL_COMMUNITY): Payer: Self-pay

## 2023-10-21 ENCOUNTER — Other Ambulatory Visit: Payer: Self-pay

## 2023-10-21 DIAGNOSIS — F1010 Alcohol abuse, uncomplicated: Secondary | ICD-10-CM | POA: Diagnosis not present

## 2023-10-21 DIAGNOSIS — F1210 Cannabis abuse, uncomplicated: Secondary | ICD-10-CM | POA: Diagnosis not present

## 2023-10-21 DIAGNOSIS — R45851 Suicidal ideations: Secondary | ICD-10-CM | POA: Insufficient documentation

## 2023-10-21 DIAGNOSIS — F332 Major depressive disorder, recurrent severe without psychotic features: Secondary | ICD-10-CM | POA: Diagnosis not present

## 2023-10-21 LAB — COMPREHENSIVE METABOLIC PANEL
ALT: 14 U/L (ref 0–44)
AST: 16 U/L (ref 15–41)
Albumin: 3.4 g/dL — ABNORMAL LOW (ref 3.5–5.0)
Alkaline Phosphatase: 76 U/L (ref 38–126)
Anion gap: 11 (ref 5–15)
BUN: 13 mg/dL (ref 6–20)
CO2: 24 mmol/L (ref 22–32)
Calcium: 9 mg/dL (ref 8.9–10.3)
Chloride: 104 mmol/L (ref 98–111)
Creatinine, Ser: 1 mg/dL (ref 0.61–1.24)
GFR, Estimated: >60 mL/min (ref >60)
Glucose, Bld: 120 mg/dL — ABNORMAL HIGH (ref 70–99)
Potassium: 3.7 mmol/L (ref 3.5–5.1)
Sodium: 139 mmol/L (ref 135–145)
Total Bilirubin: 0.6 mg/dL (ref 0.0–1.2)
Total Protein: 7.8 g/dL (ref 6.5–8.1)

## 2023-10-21 LAB — SALICYLATE LEVEL: Salicylate Lvl: <7 mg/dL — ABNORMAL LOW (ref 7.0–30.0)

## 2023-10-21 LAB — CBC
HCT: 43 % (ref 39.0–52.0)
Hemoglobin: 13.6 g/dL (ref 13.0–17.0)
MCH: 29.8 pg (ref 26.0–34.0)
MCHC: 31.6 g/dL (ref 30.0–36.0)
MCV: 94.1 fL (ref 80.0–100.0)
Platelets: 423 10*3/uL — ABNORMAL HIGH (ref 150–400)
RBC: 4.57 MIL/uL (ref 4.22–5.81)
RDW: 15.3 % (ref 11.5–15.5)
WBC: 7.1 10*3/uL (ref 4.0–10.5)
nRBC: 0 % (ref 0.0–0.2)

## 2023-10-21 LAB — RAPID URINE DRUG SCREEN, HOSP PERFORMED
Amphetamines: NOT DETECTED
Barbiturates: NOT DETECTED
Benzodiazepines: NOT DETECTED
Cocaine: POSITIVE — AB
Opiates: NOT DETECTED
Tetrahydrocannabinol: POSITIVE — AB

## 2023-10-21 LAB — ACETAMINOPHEN LEVEL: Acetaminophen (Tylenol), Serum: <10 ug/mL — ABNORMAL LOW (ref 10–30)

## 2023-10-21 LAB — ETHANOL: Alcohol, Ethyl (B): <10 mg/dL (ref <10)

## 2023-10-21 MED ORDER — LORAZEPAM 2 MG/ML IJ SOLN: 2.0000 mg | Freq: Four times a day (QID) | INTRAMUSCULAR | Status: DC | PRN

## 2023-10-21 MED ORDER — LORAZEPAM 1 MG PO TABS
2.0000 mg | ORAL_TABLET | Freq: Four times a day (QID) | ORAL | Status: DC | PRN
  Administered 2023-10-21 (×2): 2 mg via ORAL
  Filled 2023-10-21 (×2): qty 2

## 2023-10-21 MED ORDER — QUETIAPINE FUMARATE 200 MG PO TABS
200.0000 mg | ORAL_TABLET | Freq: Every day | ORAL | Status: DC
  Administered 2023-10-21: 200 mg via ORAL
  Filled 2023-10-21: qty 1

## 2023-10-21 NOTE — ED Notes (Signed)
 Pt reports being homeless. Has been off of his psych meds for 4 months. States he has been having SI thoughts x 2 months that worsened this week with plan to jump off a bridge or run in front of a moving vehicle. Pt also states, I'm a cutter. Pt has old superficial lacerations to both arms. No current open wound nor pt has attempted to cut this time. Pt states, I just need help to get back to my meds. Safety precautions maintained. Pt changed into purple scrubs and belongings locked. Security notified by charge nurse to wand pt. Will continue to monitor.

## 2023-10-21 NOTE — Progress Notes (Addendum)
 Pt was accepted to [facility]  10/21/2023 Bed Assignment: 316 Night AC will update on TIME  Address: [address] [place], [place], [place] [address]  [facility]U FAX Number [phone]  Pt meets inpatient criteria per: Nash Batter NP  Attending Physician will be Dr. Charlie Cam HAS  Report can be called to: [phone]  Pt can arrive after 2nd Shift Tonight [facility] will update on DC   Care Team notified: Cherylynn Ernst RN, Nash Batter NP, Demetria Ravenell RN   [place] Guelda Batson, MSW, [facility] 10/21/2023 12:11 PM

## 2023-10-21 NOTE — ED Notes (Signed)
 Pt is asking for his scheduled home meds. ED provider made aware.

## 2023-10-21 NOTE — ED Notes (Signed)
 Pt wanded and belongings placed in locker #14

## 2023-10-21 NOTE — ED Notes (Signed)
 Recalled magistrate , still waiting

## 2023-10-21 NOTE — ED Notes (Signed)
 [phone]
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
## 2023-10-21 NOTE — ED Notes (Signed)
 Paper work in purple zone

## 2023-10-21 NOTE — ED Provider Notes (Signed)
 [place] EMERGENCY DEPARTMENT AT [facility] Provider Note   CSN: [id number] Arrival date & time: 10/21/23  9470     History  Chief Complaint  Patient presents with   Suicidal    Thomas [name] is a 53 y.o. male with a past medical history of polysubstance abuse, mood disorder, MDD, bipolar 1, homelessness presents to emergency department for evaluation of suicidal ideation over the past 2 months.  He reports that he sees [facility] but has been out of his medications for the past 4 months.  He currently has a plan to kill himself either by cutting or jumping off a bridge. He is currently here voluntarily. He denies HI, other medical complaints  HPI     Home Medications Prior to Admission medications   Medication Sig Start Date End Date Taking? Authorizing Provider  acetaminophen  (TYLENOL ) 500 MG tablet Take 2 tablets (1,000 mg total) by mouth every 8 (eight) hours as needed for mild pain. 08/18/21   Meuth, Brooke A, PA-C  lidocaine  (LIDODERM ) 5 % Place 1 patch onto the skin daily. Remove & Discard patch within 12 hours or as directed by MD 08/18/21   Meuth, Lyle LABOR, PA-C  methocarbamol  (ROBAXIN ) 500 MG tablet Take 2 tablets (1,000 mg total) by mouth every 6 (six) hours as needed for muscle spasms. 08/18/21   Meuth, Brooke A, PA-C  oxyCODONE  (OXY IR/ROXICODONE ) 5 MG immediate release tablet Take 1 tablet (5 mg total) by mouth every 6 (six) hours as needed for severe pain. 08/18/21   Meuth, Lyle LABOR, PA-C  QUEtiapine  (SEROQUEL ) 200 MG tablet Take 1 tablet (200 mg total) by mouth daily at 8 pm. 08/18/21   Meuth, Lyle LABOR, PA-C      Allergies    Patient has no known allergies.    Review of Systems   Review of Systems  Constitutional:  Negative for chills, fatigue and fever.  Respiratory:  Negative for cough, chest tightness, shortness of breath and wheezing.   Cardiovascular:  Negative for chest pain and palpitations.  Gastrointestinal:  Negative for abdominal pain,  constipation, diarrhea, nausea and vomiting.  Neurological:  Negative for dizziness, seizures, weakness, light-headedness, numbness and headaches.  Psychiatric/Behavioral:  Positive for suicidal ideas. Negative for agitation, hallucinations and self-injury.     Physical Exam Updated Vital Signs BP 118/70 (BP Location: Right Arm)   Pulse 78   Temp 98.3 F (36.8 C)   Resp 16   Ht 5' 9 (1.753 m)   Wt 62.1 kg   SpO2 98%   BMI 20.23 kg/m  Physical Exam Vitals and nursing note reviewed.  Constitutional:      General: He is not in acute distress.    Appearance: Normal appearance.  HENT:     Head: Normocephalic and atraumatic.  Eyes:     General: No scleral icterus.       Right eye: No discharge.        Left eye: No discharge.     Extraocular Movements: Extraocular movements intact.     Conjunctiva/sclera: Conjunctivae normal.     Pupils: Pupils are equal, round, and reactive to light.  Cardiovascular:     Rate and Rhythm: Normal rate.  Pulmonary:     Effort: Pulmonary effort is normal. No respiratory distress.     Breath sounds: Normal breath sounds.  Skin:    General: Skin is warm.     Coloration: Skin is not jaundiced or pale.     Comments: No acute break  in skin integrity nor obvious self injury  Neurological:     Mental Status: He is alert and oriented to person, place, and time. Mental status is at baseline.     Cranial Nerves: No cranial nerve deficit.     Sensory: No sensory deficit.     Motor: No weakness.     Coordination: Coordination normal.     Gait: Gait normal.     Deep Tendon Reflexes: Reflexes normal.     Comments: Acting appropriately.  Follows commands appropriately.  Ambulates without difficulty.  Psychiatric:     Comments: Pleasant cooperative male with +SI    ED Results / Procedures / Treatments   Labs (all labs ordered are listed, but only abnormal results are displayed) Labs Reviewed  COMPREHENSIVE METABOLIC PANEL - Abnormal; Notable for the  following components:      Result Value   Glucose, Bld 120 (*)    Albumin 3.4 (*)    All other components within normal limits  SALICYLATE LEVEL - Abnormal; Notable for the following components:   Salicylate Lvl <7.0 (*)    All other components within normal limits  ACETAMINOPHEN  LEVEL - Abnormal; Notable for the following components:   Acetaminophen  (Tylenol ), Serum <10 (*)    All other components within normal limits  CBC - Abnormal; Notable for the following components:   Platelets 423 (*)    All other components within normal limits  ETHANOL  RAPID URINE DRUG SCREEN, HOSP PERFORMED    EKG None  Radiology No results found.  Procedures Procedures    Medications Ordered in ED Medications - No data to display  ED Course/ Medical Decision Making/ A&P Clinical Course as of 10/21/23 9178  [facility] Oct 21, 2023  0759 Platelets(!): 423 Baseline 434-589 over past two years [LB]    Clinical Course User Index [LB] Minnie Tinnie BRAVO, PA                                 Medical Decision Making Amount and/or Complexity of Data Reviewed Labs: ordered. Decision-making details documented in ED Course.   Patient presents to the ED for concern of suicidal ideation with plan, this involves an extensive number of treatment options, and is a complaint that carries with it a high risk of complications and morbidity.     Co morbidities that complicate the patient evaluation  polysubstance abuse, mood disorder, MDD, bipolar 1, homelessness   Additional history obtained:  Additional history obtained from Nursing and Outside Medical Records   External records from outside source obtained and reviewed including  Triage RN note Recent medical evaluation and medication list   Lab Tests:  I Ordered, and personally interpreted labs.  The pertinent results include:   UDS pending Platelets 423 (baseline 4 34-5 89 over past 2 years) Glucose 120 (baseline 92-108 over past 2  years) Albumin 3.4 (baseline is 2.1-3.1 over past 2 years)     Consultations Obtained:  I requested consultation with TTS - pending   Problem List / ED Course:  SI VS WNL Reports he has been out of his medication (Seroquel ) for 4 months - reports that he sees [facility] with plan for past 2 months. Filled out IVC but as he is here voluntarily do not need to file it at this time Medically clear. TTS pending   Reevaluation:  After the interventions noted above, I reevaluated the patient and found that they have :  stayed the same    Dispostion:  After consideration of the diagnostic results and the patients response to treatment, I feel that the patent would benefit from TTS consult.   Final Clinical Impression(s) / ED Diagnoses Final diagnoses:  Suicidal ideation    Rx / DC Orders ED Discharge Orders     None        Minnie Tinnie BRAVO, PA 10/21/23 9176    Levander Houston, MD 10/21/23 [phone]

## 2023-10-21 NOTE — ED Notes (Signed)
 Unit secretary ordered breakfast tray.

## 2023-10-21 NOTE — ED Triage Notes (Signed)
 Pt is homeless and states he has been having Suicidal thoughts - states he is a "cutter".

## 2023-10-21 NOTE — ED Notes (Signed)
 IVC has been completed, 3 copies on the clipboard, one copy in red folder, one in medical records drawer in orange zone

## 2023-10-21 NOTE — ED Notes (Signed)
 IVC is done waiting on  magistrate.

## 2023-10-21 NOTE — ED Notes (Signed)
 Transportation called about patient being transported tomorrow.

## 2023-10-22 ENCOUNTER — Other Ambulatory Visit: Payer: Self-pay

## 2023-10-22 ENCOUNTER — Inpatient Hospital Stay
Admission: AD | Admit: 2023-10-22 | Discharge: 2023-10-29 | DRG: 885 | Disposition: A | Discharge status: Home / Self Care | Payer: 59 | Source: Intra-hospital | Attending: Psychiatry | Admitting: Psychiatry

## 2023-10-22 ENCOUNTER — Encounter: Payer: Self-pay | Admitting: Nurse Practitioner

## 2023-10-22 DIAGNOSIS — Z5941 Food insecurity: Secondary | ICD-10-CM

## 2023-10-22 DIAGNOSIS — F1410 Cocaine abuse, uncomplicated: Secondary | ICD-10-CM | POA: Diagnosis not present

## 2023-10-22 DIAGNOSIS — F17210 Nicotine dependence, cigarettes, uncomplicated: Secondary | ICD-10-CM | POA: Diagnosis present

## 2023-10-22 DIAGNOSIS — G4700 Insomnia, unspecified: Secondary | ICD-10-CM | POA: Diagnosis present

## 2023-10-22 DIAGNOSIS — Z5901 Sheltered homelessness: Secondary | ICD-10-CM

## 2023-10-22 DIAGNOSIS — F1210 Cannabis abuse, uncomplicated: Secondary | ICD-10-CM | POA: Diagnosis present

## 2023-10-22 DIAGNOSIS — F329 Major depressive disorder, single episode, unspecified: Principal | ICD-10-CM | POA: Diagnosis present

## 2023-10-22 DIAGNOSIS — F3130 Bipolar disorder, current episode depressed, mild or moderate severity, unspecified: Principal | ICD-10-CM | POA: Diagnosis present

## 2023-10-22 DIAGNOSIS — R45851 Suicidal ideations: Secondary | ICD-10-CM | POA: Diagnosis present

## 2023-10-22 DIAGNOSIS — Z560 Unemployment, unspecified: Secondary | ICD-10-CM

## 2023-10-22 DIAGNOSIS — Z79899 Other long term (current) drug therapy: Secondary | ICD-10-CM

## 2023-10-22 MED ORDER — ALUM & MAG HYDROXIDE-SIMETH 200-200-20 MG/5ML PO SUSP: 30.0000 mL | ORAL | Status: DC | PRN

## 2023-10-22 MED ORDER — HALOPERIDOL LACTATE 5 MG/ML IJ SOLN: 10.0000 mg | Freq: Three times a day (TID) | INTRAMUSCULAR | Status: DC | PRN

## 2023-10-22 MED ORDER — HALOPERIDOL 5 MG PO TABS: 5.0000 mg | ORAL_TABLET | Freq: Three times a day (TID) | ORAL | Status: DC | PRN

## 2023-10-22 MED ORDER — HYDROXYZINE HCL 25 MG PO TABS: 25.0000 mg | ORAL_TABLET | Freq: Three times a day (TID) | ORAL | Status: DC | PRN

## 2023-10-22 MED ORDER — NICOTINE 14 MG/24HR TD PT24
14.0000 mg | MEDICATED_PATCH | Freq: Every day | TRANSDERMAL | Status: DC
  Filled 2023-10-22 (×4): qty 1

## 2023-10-22 MED ORDER — ACETAMINOPHEN 325 MG PO TABS
650.0000 mg | ORAL_TABLET | Freq: Four times a day (QID) | ORAL | Status: DC | PRN
  Administered 2023-10-29: 650 mg via ORAL
  Filled 2023-10-22: qty 2

## 2023-10-22 MED ORDER — LORAZEPAM 2 MG/ML IJ SOLN: 2.0000 mg | Freq: Three times a day (TID) | INTRAMUSCULAR | Status: DC | PRN

## 2023-10-22 MED ORDER — DIPHENHYDRAMINE HCL 50 MG/ML IJ SOLN: 50.0000 mg | Freq: Three times a day (TID) | INTRAMUSCULAR | Status: DC | PRN

## 2023-10-22 MED ORDER — MAGNESIUM HYDROXIDE 400 MG/5ML PO SUSP: 30.0000 mL | Freq: Every day | ORAL | Status: DC | PRN

## 2023-10-22 MED ORDER — QUETIAPINE FUMARATE 200 MG PO TABS
200.0000 mg | ORAL_TABLET | Freq: Every day | ORAL | Status: DC
Start: 2023-10-22 — End: 2023-10-27
  Administered 2023-10-22 – 2023-10-26 (×5): 200 mg via ORAL
  Filled 2023-10-22 (×5): qty 1

## 2023-10-22 MED ORDER — HALOPERIDOL LACTATE 5 MG/ML IJ SOLN: 5.0000 mg | Freq: Three times a day (TID) | INTRAMUSCULAR | Status: DC | PRN

## 2023-10-22 MED ORDER — DIPHENHYDRAMINE HCL 25 MG PO CAPS: 50.0000 mg | ORAL_CAPSULE | Freq: Three times a day (TID) | ORAL | Status: DC | PRN

## 2023-10-22 MED ORDER — TRAZODONE HCL 50 MG PO TABS
50.0000 mg | ORAL_TABLET | Freq: Every evening | ORAL | Status: DC | PRN
  Administered 2023-10-22: 50 mg via ORAL
  Filled 2023-10-22: qty 1

## 2023-10-22 NOTE — Tx Team (Signed)
 Initial Treatment Plan 10/22/2023 1:55 PM [name] FMW:[id number]    PATIENT STRESSORS: Other: Homeless     PATIENT STRENGTHS: Capable of independent living  Communication skills    PATIENT IDENTIFIED PROBLEMS: Homeless                     DISCHARGE CRITERIA:  Ability to meet basic life and health needs  PRELIMINARY DISCHARGE PLAN: Placement in alternative living arrangements  PATIENT/FAMILY INVOLVEMENT: This treatment plan has been presented to and reviewed with the patient, Thomas [name]. The patient has been given the opportunity to ask questions and make suggestions.  Benton littie Gains, RN 10/22/2023, 1:55 PM

## 2023-10-22 NOTE — Progress Notes (Addendum)
 Pt calm and pleasant during assessment denying SI/HI/AVH at this time. Pt observed by this clinical research associate interacting appropriately with staff and peers on the unit. Pt compliant with medication administration per MD orders. Pt given education, support, and encouragement to be active in his treatment plan. Pt being monitored Q 15 minutes for safety per unit protocol, remains safe on the unit

## 2023-10-22 NOTE — Plan of Care (Signed)
  Problem: Education: Goal: Knowledge of [place] [facility] Education information/materials will improve Outcome: Not Progressing Goal: Emotional status will improve Outcome: Not Progressing Goal: Mental status will improve Outcome: Not Progressing Goal: Verbalization of understanding the information provided will improve Outcome: Not Progressing   Problem: Activity: Goal: Interest or engagement in activities will improve Outcome: Not Progressing Goal: Sleeping patterns will improve Outcome: Not Progressing   Problem: Coping: Goal: Ability to verbalize frustrations and anger appropriately will improve Outcome: Not Progressing Goal: Ability to demonstrate self-control will improve Outcome: Not Progressing   Problem: Health Behavior/Discharge Planning: Goal: Identification of resources available to assist in meeting health care needs will improve Outcome: Not Progressing Goal: Compliance with treatment plan for underlying cause of condition will improve Outcome: Not Progressing   Problem: Physical Regulation: Goal: Ability to maintain clinical measurements within normal limits will improve Outcome: Not Progressing   Problem: Safety: Goal: Periods of time without injury will increase Outcome: Not Progressing   Problem: Education: Goal: Ability to make informed decisions regarding treatment will improve Outcome: Not Progressing   Problem: Coping: Goal: Coping ability will improve Outcome: Not Progressing   Problem: Health Behavior/Discharge Planning: Goal: Identification of resources available to assist in meeting health care needs will improve Outcome: Not Progressing   Problem: Medication: Goal: Compliance with prescribed medication regimen will improve Outcome: Not Progressing   Problem: Self-Concept: Goal: Ability to disclose and discuss suicidal ideas will improve Outcome: Not Progressing Goal: Will verbalize positive feelings about self Outcome: Not  Progressing Note: Patient is initiating therapy. Patient will maintain adherence

## 2023-10-22 NOTE — Progress Notes (Signed)
 Patient admitted voluntary to BMU from [facility] ED with diagnosis of worsening depression. Patient presents to unit ambulatory, unsteady, and A&Ox4.  Patient states, I been having suicidal thoughts and I've been off my meds. Patient's affect is anxious/depressed, speech is clear and thoughts are organized. Patient endorses depression and anxiety 8/10 stating his main stressor is lack of housing.  Patient currently denies suicidal ideations, homicidal ideations, audio or visual hallucinations and verbally contracts for safety on unit.  Reports chronic back pain 7/10 for which he takes Tylenol  at home. Pt reports generalized weakness. Patient denies poor appetite or weight loss. Denies incontinence and reports last BM yesterday.  Patient denies smoking or drug abuse but does endorse daily ETOH use. Patient reports being homeless with no support system.   Skin assessment completed with [facility], RN: Abrasions to left lower leg.   Emotional support and reassurance provided throughout admission intake. Afterwards, oriented patient to unit, room and call light, reviewed POC with all questions answered and understanding verbalzied. Placed pt on high risk fall precautions due to his admission of recent falls. Denies any needs at this time. Will continue to monitor with ongoing Q 15 minute safety checks per unit protocol.

## 2023-10-22 NOTE — ED Notes (Signed)
Patient ambulated to bathroom w/o assistance

## 2023-10-22 NOTE — ED Notes (Signed)
 IVC CURRENT EXP 1/13

## 2023-10-22 NOTE — ED Notes (Signed)
 Recently sleeping. Restless and arousable to voice. Alert, NAD, calm, interactive, VSS, beginning to eat. Denies pain. Mentions nightmares. Endorses SI with plan w/o intent.

## 2023-10-22 NOTE — ED Notes (Addendum)
 Deputy here, belongings given to deputy, limited-ambulatory, out in w/c to SD cruiser with deputy. Pt alert, NAD, calm, interactive, cooperative. Denies questions, needs, sx or complaints.

## 2023-10-22 NOTE — ED Notes (Signed)
 Belongings removed from Locker/ cabinet 14 (3 bags to go with SD) with pt to [facility] BMU. Pending SD arrival.

## 2023-10-22 NOTE — Group Note (Addendum)
 LCSW Group Therapy Note   Group Date: 10/22/2023 Start Time: 1330 End Time: 1430   Type of Therapy and Topic:  Group Therapy: Boundaries  Participation Level:  Did Not Attend  Description of Group: This group will address the use of boundaries in their personal lives. Patients will explore why boundaries are important, the difference between healthy and unhealthy boundaries, and negative and postive outcomes of different boundaries and will look at how boundaries can be crossed.  Patients will be encouraged to identify current boundaries in their own lives and identify what kind of boundary is being set. Facilitators will guide patients in utilizing problem-solving interventions to address and correct types boundaries being used and to address when no boundary is being used. Understanding and applying boundaries will be explored and addressed for obtaining and maintaining a balanced life. Patients will be encouraged to explore ways to assertively make their boundaries and needs known to significant others in their lives, using other group members and facilitator for role play, support, and feedback.  Therapeutic Goals:  1.  Patient will identify areas in their life where setting clear boundaries could be  used to improve their life.  2.  Patient will identify signs/triggers that a boundary is not being respected. 3.  Patient will identify two ways to set boundaries in order to achieve balance in  their lives: 4.  Patient will demonstrate ability to communicate their needs and set boundaries  through discussion and/or role plays  Summary of Patient Progress:   Patient did not attend group.    Therapeutic Modalities:   Cognitive Behavioral Therapy Solution-Focused Therapy  Sherryle JINNY Margo, LCSW 10/22/2023  3:02 PM

## 2023-10-22 NOTE — Group Note (Signed)
 Date:  10/22/2023 Time:  9:38 PM  Group Topic/Focus:  Stages of Change:   The focus of this group is to explain the stages of change and help patients identify changes they want to make upon discharge. Wrap-Up Group:   The focus of this group is to help patients review their daily goal of treatment and discuss progress on daily workbooks.    Participation Level:  Minimal  Participation Quality:  Appropriate and Attentive  Affect:  Flat  Cognitive:  Alert  Insight: Good and Improving  Engagement in Group:  Engaged and Limited  Modes of Intervention:  Discussion  Additional Comments:     Maglione,Scottie Stanish E 10/22/2023, 9:38 PM

## 2023-10-22 NOTE — [facility] Suicide Risk Assessment (Signed)
 [facility] INPATIENT:  Family/Significant Other Suicide Prevention Education  Suicide Prevention Education:  Patient Refusal for Family/Significant Other Suicide Prevention Education: The patient Thomas [name] has refused to provide written consent for family/significant other to be provided Family/Significant Other Suicide Prevention Education during admission and/or prior to discharge.  Physician notified.  SPE completed with pt, as pt refused to consent to family contact. SPI pamphlet provided to pt and pt was encouraged to share information with support network, ask questions, and talk about any concerns relating to SPE. Pt denies access to guns/firearms and verbalized understanding of information provided. Mobile Crisis information also provided to pt.  Nadara JONELLE Fam 10/22/2023, 2:30 PM

## 2023-10-22 NOTE — ED Notes (Addendum)
 Transport requested thru [facility], pending arrival. Pt sleeping.

## 2023-10-22 NOTE — Progress Notes (Signed)
   10/22/23 1357  Psych Admission Type (Psych Patients Only)  Admission Status Voluntary  Psychosocial Assessment  Patient Complaints Anxiety  Eye Contact Brief  Facial Expression Anxious;Worried  Affect Anxious  Armed Forces Technical Officer Cooperative  Mood Pleasant  Thought Process  Coherency WDL  Content WDL  Delusions None reported or observed  Perception WDL  Hallucination None reported or observed  Judgment WDL  Confusion None  Danger to Self  Current suicidal ideation? Denies  Danger to Others  Danger to Others None reported or observed
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
## 2023-10-22 NOTE — ED Provider Notes (Signed)
 Patient accepted to [place] [facility] unit.   Gerhard Munch, MD 10/22/23 1030

## 2023-10-23 DIAGNOSIS — F1210 Cannabis abuse, uncomplicated: Secondary | ICD-10-CM

## 2023-10-23 DIAGNOSIS — F3130 Bipolar disorder, current episode depressed, mild or moderate severity, unspecified: Principal | ICD-10-CM

## 2023-10-23 DIAGNOSIS — F1410 Cocaine abuse, uncomplicated: Secondary | ICD-10-CM | POA: Diagnosis not present

## 2023-10-23 LAB — LIPID PANEL
Cholesterol: 148 mg/dL (ref 0–200)
HDL: 36 mg/dL — ABNORMAL LOW (ref >40)
LDL Cholesterol: 100 mg/dL — ABNORMAL HIGH (ref 0–99)
Total CHOL/HDL Ratio: 4.1 {ratio}
Triglycerides: 58 mg/dL (ref <150)
VLDL: 12 mg/dL (ref 0–40)

## 2023-10-23 MED ORDER — TRAZODONE HCL 100 MG PO TABS
100.0000 mg | ORAL_TABLET | Freq: Every day | ORAL | Status: DC
  Administered 2023-10-23 – 2023-10-28 (×6): 100 mg via ORAL
  Filled 2023-10-23 (×6): qty 1

## 2023-10-23 MED ORDER — DIVALPROEX SODIUM 500 MG PO DR TAB
500.0000 mg | DELAYED_RELEASE_TABLET | Freq: Two times a day (BID) | ORAL | Status: DC
  Administered 2023-10-23 – 2023-10-28 (×10): 500 mg via ORAL
  Filled 2023-10-23 (×13): qty 1

## 2023-10-23 NOTE — Plan of Care (Signed)
  Problem: Education: Goal: Knowledge of [name] Education information/materials will improve Outcome: Progressing Goal: Emotional status will improve Outcome: Progressing Goal: Mental status will improve Outcome: Not Met (add Reason) Goal: Verbalization of understanding the information provided will improve Outcome: Progressing   Problem: Activity: Goal: Interest or engagement in activities will improve Outcome: Progressing   Problem: Coping: Goal: Ability to verbalize frustrations and anger appropriately will improve Outcome: Progressing   Problem: Health Behavior/Discharge Planning: Goal: Identification of resources available to assist in meeting health care needs will improve Outcome: Progressing   Problem: Safety: Goal: Periods of time without injury will increase Outcome: Progressing

## 2023-10-23 NOTE — Group Note (Signed)
 [facility] LCSW Group Therapy Note   Group Date: 10/23/2023 Start Time: 1300 End Time: 1440   Type of Therapy/Topic:  Group Therapy:  Emotion Regulation  Participation Level:  None   Description of Group:    The purpose of this group is to assist patients in learning to regulate negative emotions and experience positive emotions. Patients will be guided to discuss ways in which they have been vulnerable to their negative emotions. These vulnerabilities will be juxtaposed with experiences of positive emotions or situations, and patients challenged to use positive emotions to combat negative ones. Special emphasis will be placed on coping with negative emotions in conflict situations, and patients will process healthy conflict resolution skills.  Therapeutic Goals: Patient will identify two positive emotions or experiences to reflect on in order to balance out negative emotions:  Patient will label two or more emotions that they find the most difficult to experience:  Patient will be able to demonstrate positive conflict resolution skills through discussion or role plays:   Summary of Patient Progress: Patient came to group during the last few minutes. During his time in the room he did not participate in the discussion.   Therapeutic Modalities:   Cognitive Behavioral Therapy Feelings Identification Dialectical Behavioral Therapy   Nadara JONELLE Fam, LCSW

## 2023-10-23 NOTE — Plan of Care (Signed)
 Pt compliant with procedures on the unit  Problem: Education: Goal: Knowledge of [facility] Education information/materials will improve Outcome: Progressing Goal: Emotional status will improve Outcome: Progressing Goal: Mental status will improve Outcome: Progressing Goal: Verbalization of understanding the information provided will improve Outcome: Progressing   Problem: Activity: Goal: Interest or engagement in activities will improve Outcome: Progressing Goal: Sleeping patterns will improve Outcome: Progressing   Problem: Coping: Goal: Ability to verbalize frustrations and anger appropriately will improve Outcome: Progressing Goal: Ability to demonstrate self-control will improve Outcome: Progressing   Problem: Health Behavior/Discharge Planning: Goal: Identification of resources available to assist in meeting health care needs will improve Outcome: Progressing Goal: Compliance with treatment plan for underlying cause of condition will improve Outcome: Progressing   Problem: Physical Regulation: Goal: Ability to maintain clinical measurements within normal limits will improve Outcome: Progressing   Problem: Safety: Goal: Periods of time without injury will increase Outcome: Progressing   Problem: Education: Goal: Ability to make informed decisions regarding treatment will improve Outcome: Progressing   Problem: Coping: Goal: Coping ability will improve Outcome: Progressing   Problem: Health Behavior/Discharge Planning: Goal: Identification of resources available to assist in meeting health care needs will improve Outcome: Progressing   Problem: Medication: Goal: Compliance with prescribed medication regimen will improve Outcome: Progressing   Problem: Self-Concept: Goal: Ability to disclose and discuss suicidal ideas will improve Outcome: Progressing Goal: Will verbalize positive feelings about self Outcome: Progressing Note: Patient is on  track. Patient will maintain adherence

## 2023-10-23 NOTE — Group Note (Signed)
 Date:  10/23/2023 Time:  11:11 PM  Group Topic/Focus:  Personal Choices and Values:   The focus of this group is to help patients assess and explore the importance of values in their lives, how their values affect their decisions, how they express their values and what opposes their expression.    Participation Level:  Did Not Attend  Participation Quality:   none  Affect:   none  Cognitive:   none  Insight: None  Engagement in Group:   none  Modes of Intervention:   none  Additional Comments:  none   Ivy Puryear 10/23/2023, 11:11 PM
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
## 2023-10-23 NOTE — Group Note (Signed)
 Date:  10/23/2023 Time:  5:45 PM  Group Topic/Focus:  Coping With Mental Health Crisis:   The purpose of this group is to help patients identify strategies for coping with mental health crisis.  Group discusses possible causes of crisis and ways to manage them effectively. Art Therapy video driven -Art therapy is an effective treatment for persons experiencing developmental, medical, educational, social or psychological impairment. A key goal in art therapy is to improve or restore the client's functioning and his/her sense of personal well being.    Participation Level:  Did Not Attend    Lurlean Niece 10/23/2023, 5:45 PM

## 2023-10-23 NOTE — Progress Notes (Signed)
 Pt pleasant and cooperative, spent most of the day in his room, accepted meals and witout complaints. Pt refused his nicotine  patch and was without further request.  10/23/23 1200  Psych Admission Type (Psych Patients Only)  Admission Status Voluntary  Psychosocial Assessment  Patient Complaints Depression  Eye Contact Brief  Facial Expression Sad  Affect Depressed  Speech Soft  Interaction Assertive  Motor Activity Slow  Appearance/Hygiene In scrubs  Behavior Characteristics Cooperative  Mood Pleasant  Thought Process  Coherency WDL  Content WDL  Delusions None reported or observed  Perception WDL  Hallucination None reported or observed  Judgment WDL  Confusion None  Danger to Self  Current suicidal ideation? Denies  Danger to Others  Danger to Others None reported or observed

## 2023-10-23 NOTE — BH IP Treatment Plan (Signed)
 Interdisciplinary Treatment and Diagnostic Plan Update  10/23/2023 Time of Session: 9:54AM   [name] MRN: [id number]  Principal Diagnosis: MDD (major depressive disorder)  Secondary Diagnoses: Principal Problem:   MDD (major depressive disorder)   Current Medications:  Current Facility-Administered Medications  Medication Dose Route Frequency Provider Last Rate Last Admin   acetaminophen  (TYLENOL ) tablet 650 mg  650 mg Oral Q6H PRN Mardy Legacy, NP       alum & mag hydroxide-simeth (MAALOX/MYLANTA) 200-200-20 MG/5ML suspension 30 mL  30 mL Oral Q4H PRN Mardy Legacy, NP       haloperidol  (HALDOL ) tablet 5 mg  5 mg Oral TID PRN Mardy Legacy, NP       And   diphenhydrAMINE  (BENADRYL ) capsule 50 mg  50 mg Oral TID PRN Mardy Legacy, NP       haloperidol  lactate (HALDOL ) injection 5 mg  5 mg Intramuscular TID PRN Mardy Legacy, NP       And   diphenhydrAMINE  (BENADRYL ) injection 50 mg  50 mg Intramuscular TID PRN Mardy Legacy, NP       And   LORazepam  (ATIVAN ) injection 2 mg  2 mg Intramuscular TID PRN Mardy Legacy, NP       haloperidol  lactate (HALDOL ) injection 10 mg  10 mg Intramuscular TID PRN Mardy Legacy, NP       And   diphenhydrAMINE  (BENADRYL ) injection 50 mg  50 mg Intramuscular TID PRN Mardy Legacy, NP       And   LORazepam  (ATIVAN ) injection 2 mg  2 mg Intramuscular TID PRN Mardy Legacy, NP       hydrOXYzine  (ATARAX ) tablet 25 mg  25 mg Oral TID PRN Mardy Legacy, NP       magnesium  hydroxide (MILK OF MAGNESIA) suspension 30 mL  30 mL Oral Daily PRN Mardy Legacy, NP       nicotine  (NICODERM CQ  - dosed in mg/24 hours) patch 14 mg  14 mg Transdermal Daily Parmar, Meenakshi, MD       QUEtiapine  (SEROQUEL ) tablet 200 mg  200 mg Oral QHS Mardy Legacy, NP   200 mg at 10/22/23 2107   traZODone  (DESYREL ) tablet 50 mg  50 mg Oral QHS PRN Mardy Legacy, NP   50 mg at 10/22/23 2107   PTA Medications: Medications Prior to  Admission  Medication Sig Dispense Refill Last Dose/Taking   acetaminophen  (TYLENOL ) 500 MG tablet Take 2 tablets (1,000 mg total) by mouth every 8 (eight) hours as needed for mild pain. (Patient not taking: Reported on 10/21/2023) 30 tablet 0    lidocaine  (LIDODERM ) 5 % Place 1 patch onto the skin daily. Remove & Discard patch within 12 hours or as directed by MD (Patient not taking: Reported on 10/21/2023) 30 patch 0    methocarbamol  (ROBAXIN ) 500 MG tablet Take 2 tablets (1,000 mg total) by mouth every 6 (six) hours as needed for muscle spasms. (Patient not taking: Reported on 10/21/2023) 60 tablet 0    oxyCODONE  (OXY IR/ROXICODONE ) 5 MG immediate release tablet Take 1 tablet (5 mg total) by mouth every 6 (six) hours as needed for severe pain. (Patient not taking: Reported on 10/21/2023) 25 tablet 0    QUEtiapine  (SEROQUEL ) 200 MG tablet Take 1 tablet (200 mg total) by mouth daily at 8 pm. (Patient not taking: Reported on 10/21/2023) 30 tablet 0     Patient Stressors: Other: Homeless    Patient Strengths: Capable of independent living  Communication skills   Treatment Modalities: Medication Management,  Group therapy, Case management,  1 to 1 session with clinician, Psychoeducation, Recreational therapy.   Physician Treatment Plan for Primary Diagnosis: MDD (major depressive disorder) Long Term Goal(s):     Short Term Goals:    Medication Management: Evaluate patient's response, side effects, and tolerance of medication regimen.  Therapeutic Interventions: 1 to 1 sessions, Unit Group sessions and Medication administration.  Evaluation of Outcomes: Not Met  Physician Treatment Plan for Secondary Diagnosis: Principal Problem:   MDD (major depressive disorder)  Long Term Goal(s):     Short Term Goals:       Medication Management: Evaluate patient's response, side effects, and tolerance of medication regimen.  Therapeutic Interventions: 1 to 1 sessions, Unit Group sessions and Medication  administration.  Evaluation of Outcomes: Not Met   RN Treatment Plan for Primary Diagnosis: MDD (major depressive disorder) Long Term Goal(s): Knowledge of disease and therapeutic regimen to maintain health will improve  Short Term Goals: Ability to demonstrate self-control, Ability to participate in decision making will improve, Ability to verbalize feelings will improve, Ability to disclose and discuss suicidal ideas, Ability to identify and develop effective coping behaviors will improve, and Compliance with prescribed medications will improve  Medication Management: RN will administer medications as ordered by provider, will assess and evaluate patient's response and provide education to patient for prescribed medication. RN will report any adverse and/or side effects to prescribing provider.  Therapeutic Interventions: 1 on 1 counseling sessions, Psychoeducation, Medication administration, Evaluate responses to treatment, Monitor vital signs and CBGs as ordered, Perform/monitor CIWA, COWS, AIMS and Fall Risk screenings as ordered, Perform wound care treatments as ordered.  Evaluation of Outcomes: Not Met   LCSW Treatment Plan for Primary Diagnosis: MDD (major depressive disorder) Long Term Goal(s): Safe transition to appropriate next level of care at discharge, Engage patient in therapeutic group addressing interpersonal concerns.  Short Term Goals: Engage patient in aftercare planning with referrals and resources, Increase ability to appropriately verbalize feelings, Increase emotional regulation, Facilitate acceptance of mental health diagnosis and concerns, Facilitate patient progression through stages of change regarding substance use diagnoses and concerns, Identify triggers associated with mental health/substance abuse issues, and Increase skills for wellness and recovery  Therapeutic Interventions: Assess for all discharge needs, 1 to 1 time with Social worker, Explore available  resources and support systems, Assess for adequacy in community support network, Educate family and significant other(s) on suicide prevention, Complete Psychosocial Assessment, Interpersonal group therapy.  Evaluation of Outcomes: Not Met   Progress in Treatment: Attending groups: Yes. Participating in groups: No. Taking medication as prescribed: Yes. Toleration medication: Yes. Family/Significant other contact made: No, will contact:  once permission has been granted. Patient understands diagnosis: No. Discussing patient identified problems/goals with staff: Yes. Medical problems stabilized or resolved: Yes. Denies suicidal/homicidal ideation: No. Issues/concerns per patient self-inventory: No. Other: none  New problem(s) identified: No, Describe:  none  New Short Term/Long Term Goal(s):  detox, elimination of symptoms of psychosis, medication management for mood stabilization; elimination of SI thoughts; development of comprehensive mental wellness/sobriety plan.   Patient Goals:  get rid of the thoughts of suicide  Discharge Plan or Barriers: CSW to assess patient in development of appropriate discharge plans.  Current barriers are patient is unhoused.  Additional barrier is that the patient appears unwilling to address his substance use issues.    Reason for Continuation of Hospitalization: Anxiety Depression Homicidal ideation Medication stabilization Suicidal ideation  Estimated Length of Stay:  1-7 days  Last 3 [place]  Suicide Severity Risk Score: Flowsheet Row Admission (Current) from 10/22/2023 in [facility] ED from 10/21/2023 in [facility] Emergency Department at [facility] ED from 10/22/2021 in [facility] Emergency Department at [facility]  C-SSRS RISK CATEGORY High Risk High Risk No Risk       Last [facility] 2/9 Scores:     No data to display          Scribe for Treatment Team: Sherryle JINNY Margo,  KEN 10/23/2023 10:11 AM

## 2023-10-23 NOTE — Progress Notes (Signed)
   10/23/23 2300  Psych Admission Type (Psych Patients Only)  Admission Status Voluntary  Psychosocial Assessment  Patient Complaints Depression  Eye Contact Brief  Facial Expression Sad  Affect Depressed  Speech Soft  Interaction Assertive  Motor Activity Slow  Appearance/Hygiene In scrubs  Behavior Characteristics Cooperative  Mood Pleasant  Thought Process  Coherency WDL  Content WDL  Delusions None reported or observed  Perception WDL  Hallucination None reported or observed  Judgment WDL  Confusion None  Danger to Self  Current suicidal ideation? Denies  Danger to Others  Danger to Others None reported or observed

## 2023-10-23 NOTE — Group Note (Signed)
 Date:  10/23/2023 Time:  11:36 AM  Group Topic/Focus:  Goals Group:   The focus of this group is to help patients establish daily goals to achieve during treatment and discuss how the patient can incorporate goal setting into their daily lives to aide in recovery. Personal Choices and Values:   The focus of this group is to help patients assess and explore the importance of values in their lives, how their values affect their decisions, how they express their values and what opposes their expression. Rediscovering Joy:   The focus of this group is to explore various ways to relieve stress in a positive manner.    Participation Level:  Did Not Attend   Lurlean Niece 10/23/2023, 11:36 AM

## 2023-10-23 NOTE — [facility] Suicide Risk Assessment (Signed)
 [facility] Admission Suicide Risk Assessment   Nursing information obtained from:  Patient Demographic factors:  Male, Low socioeconomic status  Loss Factors:  Decline in physical health, Financial problems / change in socioeconomic status   Principal Problem: Bipolar I disorder, most recent episode depressed (HCC) Diagnosis:  Principal Problem:   Bipolar I disorder, most recent episode depressed (HCC)  Subjective Data: [facility] seen at [name], ED for face-to-face psychiatric evaluation. Patient continues to endorse depressed mood with active suicidal ideations. Continues to state his plan is to either cut himself or jump off a bridge. Patient states he has been off his psychiatric medications which include Seroquel  and Depakote  x 4 months.   Continued Clinical Symptoms:  Alcohol  Use Disorder Identification Test Final Score (AUDIT): 4 The Alcohol  Use Disorders Identification Test, Guidelines for Use in Primary Care, Second Edition.  [employer] Writer [facility]). Score between 0-7:  no or low risk or alcohol  related problems. Score between 8-15:  moderate risk of alcohol  related problems. Score between 16-19:  high risk of alcohol  related problems. Score 20 or above:  warrants further diagnostic evaluation for alcohol  dependence and treatment.   CLINICAL FACTORS:   Bipolar Disorder:   Depressive phase Depression:   Anhedonia Hopelessness Impulsivity Insomnia Alcohol /Substance Abuse/Dependencies Previous Psychiatric Diagnoses and Treatments   Musculoskeletal: Strength & Muscle Tone: within normal limits Gait & Station: normal Patient leans: N/A  Psychiatric Specialty Exam:  Presentation  General Appearance:  Appropriate for Environment  Eye Contact: Fair  Speech: Clear and Coherent  Speech Volume: Normal  Handedness:No data recorded  Mood and Affect  Mood: Anxious; Depressed  Affect: Congruent; Constricted   Thought Process  Thought  Processes: Coherent; Goal Directed  Descriptions of Associations:Intact  Orientation:Full (Time, Place and Person)  Thought Content:Abstract Reasoning; Perseveration  History of Schizophrenia/Schizoaffective disorder:No Duration of Psychotic Symptoms:NA Hallucinations:Hallucinations: None  Ideas of Reference:None  Suicidal Thoughts:Suicidal Thoughts: Yes, Active SI Active Intent and/or Plan: With Plan  Homicidal Thoughts:Homicidal Thoughts: No   Sensorium  Memory: Immediate Fair; Recent Fair  Judgment: Impaired  Insight: Shallow   Executive Functions  Concentration: Fair  Attention Span: Fair  Recall: [employer] of Knowledge: Fair  Language: Fair   Psychomotor Activity  Psychomotor Activity: Psychomotor Activity: Decreased   Assets  Assets: Communication Skills; Desire for Improvement   Sleep  Sleep: Sleep: Poor    Physical Exam: Physical Exam Constitutional:      Appearance: Normal appearance.  HENT:     Head: Normocephalic and atraumatic.     Nose: No congestion.  Eyes:     Pupils: Pupils are equal, round, and reactive to light.  Cardiovascular:     Rate and Rhythm: Regular rhythm.  Pulmonary:     Effort: Pulmonary effort is normal.  Neurological:     Mental Status: He is alert and oriented to person, place, and time.    Review of Systems  Constitutional:  Negative for chills and fever.  HENT:  Negative for hearing loss and sore throat.   Eyes:  Negative for blurred vision and double vision.  Respiratory:  Negative for cough and shortness of breath.   Cardiovascular:  Negative for chest pain and palpitations.  Gastrointestinal:  Negative for nausea and vomiting.  Neurological:  Negative for dizziness and speech change.  Psychiatric/Behavioral:  Positive for depression, substance abuse and suicidal ideas. The patient is nervous/anxious and has insomnia.    Blood pressure (!) 87/52, pulse 85, temperature 98.4 F (36.9 C),  resp. rate 20,  height 5' 9 (1.753 m), weight 68 kg, SpO2 96%. Body mass index is 22.15 kg/m.   COGNITIVE FEATURES THAT CONTRIBUTE TO RISK:  Thought constriction (tunnel vision)    SUICIDE RISK:   Severe:  Frequent, intense, and enduring suicidal ideation, specific plan,  PLAN OF CARE: Per H&P  I certify that inpatient services furnished can reasonably be expected to improve the patient's condition.   Wenceslao Harries, MD

## 2023-10-24 DIAGNOSIS — F1210 Cannabis abuse, uncomplicated: Secondary | ICD-10-CM | POA: Diagnosis not present

## 2023-10-24 DIAGNOSIS — F1410 Cocaine abuse, uncomplicated: Secondary | ICD-10-CM | POA: Diagnosis not present

## 2023-10-24 DIAGNOSIS — F3130 Bipolar disorder, current episode depressed, mild or moderate severity, unspecified: Secondary | ICD-10-CM | POA: Diagnosis not present

## 2023-10-24 LAB — LIPID PANEL
Cholesterol: 144 mg/dL (ref 0–200)
HDL: 34 mg/dL — ABNORMAL LOW (ref >40)
LDL Cholesterol: 93 mg/dL (ref 0–99)
Total CHOL/HDL Ratio: 4.2 {ratio}
Triglycerides: 83 mg/dL (ref <150)
VLDL: 17 mg/dL (ref 0–40)

## 2023-10-24 LAB — HEMOGLOBIN A1C
Hgb A1c MFr Bld: 5.5 % (ref 4.8–5.6)
Mean Plasma Glucose: 111 mg/dL
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
## 2023-10-24 NOTE — Progress Notes (Signed)
   10/24/23 1500  [facility] Fall Risk Assessment  Risk Factor Category (scoring not indicated) Nursing clinical judgement (High fall risk)  Age 53  Mental Status 0  Physical Satus 0  Elimination 0  Sensory Impairments 0  Gait or Balance 3  History or falls in past 6 months 0  Mood Stabilizer Medications 0  Benzodiazepines 0  Narcotics 0  Sedatives/Hypnotics 0  Atypical Anti Psychotics 0  Detox Protocol (Alcohol , Narcotics, etc.) 0  Total Score 3  Patient Fall Risk Level High fall risk  Required Bundle Interventions *See Row Information* High fall risk - low and high requirements implemented  Fall intervention(s) refused/Patient educated regarding refusal Nonskid socks  Screening for Fall Injury Risk (To be completed on HIGH fall risk patients) - Assessing Need for Floor Mats  Risk For Fall Injury- Criteria for Floor Mats None identified - No additional interventions needed  Safe Patient Handling Required Documentation-Repositioning Needs  Assist with movement in bed No  Fragile skin with pressure ulcer No  Unresponsive No  Safe Patient Handling Assessment  Ambulates independently Yes, no lift needed  Safety Interventions  Less Restrictive Interventions Active listening  Safe Environment  Patient oriented to unit and equipment Yes - Oriented to standard equipment

## 2023-10-24 NOTE — Group Note (Signed)
 LCSW Group Therapy Note   Group Date: 10/24/2023 Start Time: 1300 End Time: 1425   Type of Therapy and Topic:  Group Therapy: Challenging Core Beliefs  Participation Level:  Did Not Attend  Description of Group:  Patients were educated about core beliefs and asked to identify one harmful core belief that they have. Patients were asked to explore from where those beliefs originate. Patients were asked to discuss how those beliefs make them feel and the resulting behaviors of those beliefs. They were then be asked if those beliefs are true and, if so, what evidence they have to support them. Lastly, group members were challenged to replace those negative core beliefs with helpful beliefs.   Therapeutic Goals:   1. Patient will identify harmful core beliefs and explore the origins of such beliefs. 2. Patient will identify feelings and behaviors that result from those core beliefs. 3. Patient will discuss whether such beliefs are true. 4.  Patient will replace harmful core beliefs with helpful ones.  Summary of Patient Progress:  Patient did not attend group.   Therapeutic Modalities: Cognitive Behavioral Therapy; Solution-Focused Therapy   Velinda Wrobel M Rinaldo Macqueen, LCSWA 10/24/2023  2:30 PM

## 2023-10-24 NOTE — Group Note (Signed)
 Recreation Therapy Group Note   Group Topic:Goal Setting  Group Date: 10/24/2023 Start Time: 1000 End Time: 1100 Facilitators: Celestia Jeoffrey BRAVO, LRT, CTRS Location:  Craft Room  Group Description: Product/process Development Scientist. Patients were given many different magazines, a glue stick, markers, and a piece of cardstock paper. LRT and pts discussed the importance of having goals in life. LRT and pts discussed the difference between short-term and long-term goals, as well as what a SMART goal is. LRT encouraged pts to create a vision board, with images they picked and then cut out with safety scissors from the magazine, for themselves, that capture their short and long-term goals. LRT encouraged pts to show and explain their vision board to the group.   Goal Area(s) Addressed:  Patient will gain knowledge of short vs. long term goals.  Patient will identify goals for themselves. Patient will practice setting SMART goals. Patient will verbalize their goals to LRT and peers.   Affect/Mood: N/A   Participation Level: Did not attend    Clinical Observations/Individualized Feedback: Thomas [name] did not attend group.   Plan: Continue to engage patient in RT group sessions 2-3x/week.   Jeoffrey BRAVO Celestia, LRT, CTRS 10/24/2023 1:11 PM

## 2023-10-24 NOTE — Plan of Care (Signed)
   Problem: Education: Goal: Emotional status will improve Outcome: Progressing Goal: Mental status will improve Outcome: Progressing Goal: Verbalization of understanding the information provided will improve Outcome: Progressing

## 2023-10-24 NOTE — [facility] Counselor (Signed)
 CSW provided patient with substance use treatment resources to assist with finding placement to ensure a safe discharge.   Patient encouraged to start the process of calling resources to learn of potential availability.   Patient expressed no further needs at this time. CSW team to continue to assess.    Arnulfo Batson, MSW, LCSWA 10/24/2023 9:25 AM

## 2023-10-24 NOTE — Group Note (Signed)
 Date:  10/24/2023 Time:  9:53 PM  Group Topic/Focus:  Making Healthy Choices:   The focus of this group is to help patients identify negative/unhealthy choices they were using prior to admission and identify positive/healthier coping strategies to replace them upon discharge.    Participation Level:  Active  Participation Quality:  Appropriate  Affect:  Appropriate  Cognitive:  Appropriate  Insight: Appropriate  Engagement in Group:  Engaged  Modes of Intervention:  Discussion  Dwayne Lars 10/24/2023, 9:53 PM

## 2023-10-24 NOTE — Plan of Care (Signed)
   Problem: Education: Goal: Knowledge of [facility] Education information/materials will improve Outcome: Progressing Goal: Emotional status will improve Outcome: Progressing Goal: Mental status will improve Outcome: Progressing Goal: Verbalization of understanding the information provided will improve Outcome: Progressing

## 2023-10-24 NOTE — Group Note (Signed)
 Date:  10/24/2023 Time:  10:11 AM  Group Topic/Focus:  Goals Group:   The focus of this group is to help patients establish daily goals to achieve during treatment and discuss how the patient can incorporate goal setting into their daily lives to aide in recovery.    Participation Level:  Did Not Attend   [name] [facility] 10/24/2023, 10:11 AM

## 2023-10-24 NOTE — Group Note (Unsigned)
 Date:  10/24/2023 Time:  9:48 PM  Group Topic/Focus:  Making Healthy Choices:   The focus of this group is to help patients identify negative/unhealthy choices they were using prior to admission and identify positive/healthier coping strategies to replace them upon discharge.     Participation Level:  {B[facility] PARTICIPATION OZCZO:77735}  Participation Quality:  {[facility] PARTICIPATION QUALITY:22265}  Affect:  {[facility] AFFECT:22266}  Cognitive:  {[facility] COGNITIVE:22267}  Insight: {BHH Insight2:20797}  Engagement in Group:  {B[facility] ENGAGEMENT IN HMNLE:77731}  Modes of Intervention:  {[facility] MODES OF INTERVENTION:22269}  Additional Comments:  ***  Thomas [name] 10/24/2023, 9:48 PM

## 2023-10-25 DIAGNOSIS — F1410 Cocaine abuse, uncomplicated: Secondary | ICD-10-CM | POA: Diagnosis not present

## 2023-10-25 DIAGNOSIS — F3130 Bipolar disorder, current episode depressed, mild or moderate severity, unspecified: Secondary | ICD-10-CM | POA: Diagnosis not present

## 2023-10-25 DIAGNOSIS — F1210 Cannabis abuse, uncomplicated: Secondary | ICD-10-CM | POA: Diagnosis not present

## 2023-10-25 LAB — HEMOGLOBIN A1C
Hgb A1c MFr Bld: 5.4 % (ref 4.8–5.6)
Mean Plasma Glucose: 108.28 mg/dL

## 2023-10-25 NOTE — Plan of Care (Signed)
 Patient stayed in bed till this afternoon. Patient verbalized no issues. Visible in the milieu this afternoon. Appropriate with staff & peers.Patient denies SI,HI and AVH.Appetite and energy level good. Compliant with medications. Support and encouragement given.

## 2023-10-25 NOTE — Plan of Care (Signed)
  Problem: Education: Goal: Knowledge of [facility] Education information/materials will improve Outcome: Progressing Goal: Emotional status will improve Outcome: Progressing Goal: Mental status will improve Outcome: Progressing Goal: Verbalization of understanding the information provided will improve Outcome: Progressing   Problem: Activity: Goal: Interest or engagement in activities will improve Outcome: Progressing Goal: Sleeping patterns will improve Outcome: Progressing   Problem: Coping: Goal: Ability to verbalize frustrations and anger appropriately will improve Outcome: Progressing Goal: Ability to demonstrate self-control will improve Outcome: Progressing   Problem: Health Behavior/Discharge Planning: Goal: Identification of resources available to assist in meeting health care needs will improve Outcome: Progressing Goal: Compliance with treatment plan for underlying cause of condition will improve Outcome: Progressing   Problem: Physical Regulation: Goal: Ability to maintain clinical measurements within normal limits will improve Outcome: Progressing   Problem: Safety: Goal: Periods of time without injury will increase Outcome: Progressing   Problem: Education: Goal: Ability to make informed decisions regarding treatment will improve Outcome: Progressing   Problem: Coping: Goal: Coping ability will improve Outcome: Progressing   Problem: Health Behavior/Discharge Planning: Goal: Identification of resources available to assist in meeting health care needs will improve Outcome: Progressing   Problem: Medication: Goal: Compliance with prescribed medication regimen will improve Outcome: Progressing   Problem: Self-Concept: Goal: Ability to disclose and discuss suicidal ideas will improve Outcome: Progressing Goal: Will verbalize positive feelings about self Outcome: Progressing Note: Patient is on track. Patient will work on increased  adherence

## 2023-10-25 NOTE — Group Note (Signed)
 Date:  10/25/2023 Time:  10:32 AM  Group Topic/Focus:  Goals Group:   The focus of this group is to help patients establish daily goals to achieve during treatment and discuss how the patient can incorporate goal setting into their daily lives to aide in recovery.    Participation Level:  Did Not Attend   [name] [facility] 10/25/2023, 10:32 AM

## 2023-10-25 NOTE — Group Note (Signed)
 Recreation Therapy Group Note   Group Topic:Leisure Education  Group Date: 10/25/2023 Start Time: 1000 End Time: 1100 Facilitators: Celestia Jeoffrey BRAVO, LRT, CTRS Location:  Craft Room  Group Description: Leisure. Patients were given the option to choose from singing karaoke, coloring mandalas, using oil pastels, journaling, or playing with play-doh. LRT and pts discussed the meaning of leisure, the importance of participating in leisure during their free time/when they're outside of the hospital, as well as how our leisure interests can also serve as coping skills.   Goal Area(s) Addressed:  Patient will identify a current leisure interest.  Patient will learn the definition of "leisure". Patient will practice making a positive decision. Patient will have the opportunity to try a new leisure activity. Patient will communicate with peers and LRT.    Affect/Mood: N/A   Participation Level: Did not attend    Clinical Observations/Individualized Feedback: Patient did not attend group.  Plan: Continue to engage patient in RT group sessions 2-3x/week.   Jeoffrey BRAVO Celestia, LRT, CTRS 10/25/2023 12:06 PM
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
## 2023-10-26 DIAGNOSIS — F3130 Bipolar disorder, current episode depressed, mild or moderate severity, unspecified: Secondary | ICD-10-CM | POA: Diagnosis not present

## 2023-10-26 DIAGNOSIS — F1210 Cannabis abuse, uncomplicated: Secondary | ICD-10-CM | POA: Diagnosis not present

## 2023-10-26 DIAGNOSIS — F1410 Cocaine abuse, uncomplicated: Secondary | ICD-10-CM | POA: Diagnosis not present

## 2023-10-26 NOTE — Progress Notes (Signed)
   10/25/23 1000  Psych Admission Type (Psych Patients Only)  Admission Status Voluntary  Psychosocial Assessment  Patient Complaints Depression  Eye Contact Brief  Facial Expression Sad  Affect Depressed  Speech Soft  Interaction Assertive  Motor Activity Slow  Appearance/Hygiene In scrubs  Behavior Characteristics Cooperative  Mood Pleasant  Thought Process  Coherency WDL  Content WDL  Delusions None reported or observed  Perception WDL  Hallucination None reported or observed  Judgment Impaired  Confusion None  Danger to Self  Current suicidal ideation? Denies  Danger to Others  Danger to Others None reported or observed   Patient is alert and oriented, he denies SI/HI/AVH interacting appropriately with peers and staff no distress, his thoughts are organized and coherent. 15 minutes safety checks maintained.

## 2023-10-26 NOTE — Group Note (Signed)
 Date:  10/26/2023 Time:  3:18 AM  Group Topic/Focus:  Goals Group:   The focus of this group is to help patients establish daily goals to achieve during treatment and discuss how the patient can incorporate goal setting into their daily lives to aide in recovery.    Participation Level:  Active  Participation Quality:  Appropriate  Affect:  Appropriate  Cognitive:  Appropriate  Insight: Appropriate  Engagement in Group:  Engaged  Modes of Intervention:  Discussion   Thomas [name] 10/26/2023, 3:18 AM

## 2023-10-26 NOTE — Progress Notes (Signed)
 Pt was isolative at times but did attend meals and spend time in dayroom.  Pt declined medications this morning.  Pt was calm and cooperative throughout the day.  Continued monitoring for safety.   10/26/23 1800  Psych Admission Type (Psych Patients Only)  Admission Status Voluntary  Psychosocial Assessment  Patient Complaints Depression  Eye Contact Brief  Facial Expression Sad  Affect Depressed  Speech Soft  Interaction Isolative  Motor Activity Slow  Appearance/Hygiene In scrubs  Behavior Characteristics Resistant to care  Mood Preoccupied  Thought Process  Coherency WDL  Content WDL  Delusions None reported or observed  Perception WDL  Hallucination None reported or observed  Judgment Impaired  Confusion None  Danger to Self  Current suicidal ideation? Denies  Danger to Others  Danger to Others None reported or observed

## 2023-10-26 NOTE — Plan of Care (Signed)
  Problem: Coping: Goal: Ability to demonstrate self-control will improve Outcome: Progressing

## 2023-10-26 NOTE — Plan of Care (Signed)
  Problem: Education: Goal: Emotional status will improve Outcome: Progressing   Problem: Education: Goal: Knowledge of [facility] Education information/materials will improve Outcome: Progressing
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
## 2023-10-27 DIAGNOSIS — F1210 Cannabis abuse, uncomplicated: Secondary | ICD-10-CM | POA: Diagnosis not present

## 2023-10-27 DIAGNOSIS — F1410 Cocaine abuse, uncomplicated: Secondary | ICD-10-CM | POA: Diagnosis not present

## 2023-10-27 DIAGNOSIS — F3130 Bipolar disorder, current episode depressed, mild or moderate severity, unspecified: Secondary | ICD-10-CM | POA: Diagnosis not present

## 2023-10-27 MED ORDER — QUETIAPINE FUMARATE 200 MG PO TABS
300.0000 mg | ORAL_TABLET | Freq: Every day | ORAL | Status: DC
  Administered 2023-10-27 – 2023-10-28 (×2): 300 mg via ORAL
  Filled 2023-10-27 (×2): qty 1

## 2023-10-27 NOTE — Plan of Care (Signed)
 Alert and oriented X4. Compliant with medications scheduled for  bedtime. Denies hallucinations, SI/HI, and pain. Safety checks 15 min performed. Care, comfort, and safety maintained/ongoing.   Problem: Education: Goal: Knowledge of [facility] Education information/materials will improve Outcome: Progressing Goal: Emotional status will improve Outcome: Progressing Goal: Mental status will improve Outcome: Progressing Goal: Verbalization of understanding the information provided will improve Outcome: Progressing   Problem: Activity: Goal: Interest or engagement in activities will improve Outcome: Progressing Goal: Sleeping patterns will improve Outcome: Progressing   Problem: Coping: Goal: Ability to verbalize frustrations and anger appropriately will improve Outcome: Progressing Goal: Ability to demonstrate self-control will improve Outcome: Progressing   Problem: Health Behavior/Discharge Planning: Goal: Identification of resources available to assist in meeting health care needs will improve Outcome: Progressing Goal: Compliance with treatment plan for underlying cause of condition will improve Outcome: Progressing   Problem: Physical Regulation: Goal: Ability to maintain clinical measurements within normal limits will improve Outcome: Progressing   Problem: Safety: Goal: Periods of time without injury will increase Outcome: Progressing   Problem: Education: Goal: Ability to make informed decisions regarding treatment will improve Outcome: Progressing   Problem: Coping: Goal: Coping ability will improve Outcome: Progressing   Problem: Health Behavior/Discharge Planning: Goal: Identification of resources available to assist in meeting health care needs will improve Outcome: Progressing   Problem: Medication: Goal: Compliance with prescribed medication regimen will improve Outcome: Progressing   Problem: Self-Concept: Goal: Ability to disclose and  discuss suicidal ideas will improve Outcome: Progressing Goal: Will verbalize positive feelings about self Outcome: Progressing Note: Patient is on track. Patient will maintain adherence and adhere to provider and/or lab appointments

## 2023-10-27 NOTE — Group Note (Unsigned)
 Date:  10/27/2023 Time:  9:06 PM  Group Topic/Focus:  Making Healthy Choices:   The focus of this group is to help patients identify negative/unhealthy choices they were using prior to admission and identify positive/healthier coping strategies to replace them upon discharge.     Participation Level:  {BHH PARTICIPATION OZCZO:77735}  Participation Quality:  {BHH PARTICIPATION QUALITY:22265}  Affect:  {BHH AFFECT:22266}  Cognitive:  {BHH COGNITIVE:22267}  Insight: {BHH Insight2:20797}  Engagement in Group:  {BHH ENGAGEMENT IN HMNLE:77731}  Modes of Intervention:  {BHH MODES OF INTERVENTION:22269}  Additional Comments:  ***  Thomas [name] 10/27/2023, 9:06 PM

## 2023-10-27 NOTE — Group Note (Signed)
 Date:  10/27/2023 Time:  9:45 PM  Group Topic/Focus:  Making Healthy Choices:   The focus of this group is to help patients identify negative/unhealthy choices they were using prior to admission and identify positive/healthier coping strategies to replace them upon discharge.    Participation Level:  Did Not Attend   Thomas [name] 10/27/2023, 9:45 PM

## 2023-10-27 NOTE — Group Note (Unsigned)
 Date:  10/27/2023 Time:  4:59 AM  Group Topic/Focus:  Wrap-Up Group:   The focus of this group is to help patients review their daily goal of treatment and discuss progress on daily workbooks.     Participation Level:  {BHH PARTICIPATION OZCZO:77735}  Participation Quality:  {BHH PARTICIPATION QUALITY:22265}  Affect:  {BHH AFFECT:22266}  Cognitive:  {BHH COGNITIVE:22267}  Insight: {BHH Insight2:20797}  Engagement in Group:  {BHH ENGAGEMENT IN HMNLE:77731}  Modes of Intervention:  {BHH MODES OF INTERVENTION:22269}  Additional Comments:  ***  Prentice Ace 10/27/2023, 4:59 AM
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
## 2023-10-27 NOTE — Plan of Care (Signed)
 Patient calm and cooperative on approach. Patient isolates to his room but visible at times. Patient positive for hearing voices but states that its quieter now. Denies SI,HI and VH. Appetite and energy level good. Compliant with medications. Support and encouragement given.

## 2023-10-28 NOTE — Plan of Care (Signed)
  Problem: Education: Goal: Emotional status will improve Outcome: Progressing Goal: Mental status will improve Outcome: Progressing   Problem: Education: Goal: Emotional status will improve Outcome: Progressing Goal: Mental status will improve Outcome: Progressing

## 2023-10-28 NOTE — Progress Notes (Signed)
 Patient isolative to self and room. Voiced no concerns or complaints. Denies SI, HI, AVH. Medication compliant. Appropriate with staff and peers. Visible in milieu, watching tv with peers. Encouragement and support provided. Safety checks maintained. Medications given as prescribed. Pt receptive and remains safe on unit with q 15 min checks.

## 2023-10-28 NOTE — Group Note (Signed)
 Recreation Therapy Group Note   Group Topic:Emotion Expression  Group Date: 10/28/2023 Start Time: 1530 End Time: 1615 Facilitators: Celestia Jeoffrey FORBES ARTICE, CTRS Location:  Craft Room  Group Description: Painting a Peaceful Place. Patients and LRT discuss what it means to be "at peace", what it feels like physically and mentally. Pts are given a canvas and watercolor paint to use and encouraged to draw their idea of a peaceful place. Pts and LRT discuss how they use this in their daily life post discharge. Pts are encouraged to take their canvas home with them as a reminder to find their peaceful place whenever they are feeling depressed, anxious, etc.    Goal Area(s) Addressed:  Patient will identify what it means to experience a "peaceful" emotion. Patient will identify a new coping skill.  Patient will express their emotions through art. Patients will increase communication by talking with LRT and peers while in group.   Affect/Mood: N/A   Participation Level: Did not attend    Clinical Observations/Individualized Feedback: Patient did not attend group.   Plan: Continue to engage patient in RT group sessions 2-3x/week.   Jeoffrey FORBES Celestia, LRT, CTRS 10/28/2023 5:46 PM

## 2023-10-28 NOTE — BH IP Treatment Plan (Signed)
 Interdisciplinary Treatment and Diagnostic Plan Update  10/28/2023 Time of Session: 9:00AM Thomas [name] MRN: [id number]  Principal Diagnosis: Bipolar I disorder, most recent episode depressed (HCC)  Secondary Diagnoses: Principal Problem:   Bipolar I disorder, most recent episode depressed (HCC) Active Problems:   Mild tetrahydrocannabinol (THC) abuse   Current Medications:  Current Facility-Administered Medications  Medication Dose Route Frequency Provider Last Rate Last Admin   acetaminophen  (TYLENOL ) tablet 650 mg  650 mg Oral Q6H PRN Mardy Legacy, NP       alum & mag hydroxide-simeth (MAALOX/MYLANTA) 200-200-20 MG/5ML suspension 30 mL  30 mL Oral Q4H PRN Mardy Legacy, NP       haloperidol  (HALDOL ) tablet 5 mg  5 mg Oral TID PRN Mardy Legacy, NP       And   diphenhydrAMINE  (BENADRYL ) capsule 50 mg  50 mg Oral TID PRN Mardy Legacy, NP       haloperidol  lactate (HALDOL ) injection 5 mg  5 mg Intramuscular TID PRN Mardy Legacy, NP       And   diphenhydrAMINE  (BENADRYL ) injection 50 mg  50 mg Intramuscular TID PRN Mardy Legacy, NP       And   LORazepam  (ATIVAN ) injection 2 mg  2 mg Intramuscular TID PRN Mardy Legacy, NP       haloperidol  lactate (HALDOL ) injection 10 mg  10 mg Intramuscular TID PRN Mardy Legacy, NP       And   diphenhydrAMINE  (BENADRYL ) injection 50 mg  50 mg Intramuscular TID PRN Mardy Legacy, NP       And   LORazepam  (ATIVAN ) injection 2 mg  2 mg Intramuscular TID PRN Mardy Legacy, NP       divalproex  (DEPAKOTE ) DR tablet 500 mg  500 mg Oral Q12H Parmar, Meenakshi, MD   500 mg at 10/28/23 9173   hydrOXYzine  (ATARAX ) tablet 25 mg  25 mg Oral TID PRN Mardy Legacy, NP       magnesium  hydroxide (MILK OF MAGNESIA) suspension 30 mL  30 mL Oral Daily PRN Mardy Legacy, NP       nicotine  (NICODERM CQ  - dosed in mg/24 hours) patch 14 mg  14 mg Transdermal Daily Parmar, Meenakshi, MD       QUEtiapine  (SEROQUEL ) tablet 300 mg   300 mg Oral QHS Parmar, Meenakshi, MD   300 mg at 10/27/23 2121   traZODone  (DESYREL ) tablet 100 mg  100 mg Oral QHS Parmar, Meenakshi, MD   100 mg at 10/27/23 2121   PTA Medications: Medications Prior to Admission  Medication Sig Dispense Refill Last Dose/Taking   acetaminophen  (TYLENOL ) 500 MG tablet Take 2 tablets (1,000 mg total) by mouth every 8 (eight) hours as needed for mild pain. (Patient not taking: Reported on 10/21/2023) 30 tablet 0    lidocaine  (LIDODERM ) 5 % Place 1 patch onto the skin daily. Remove & Discard patch within 12 hours or as directed by MD (Patient not taking: Reported on 10/21/2023) 30 patch 0    methocarbamol  (ROBAXIN ) 500 MG tablet Take 2 tablets (1,000 mg total) by mouth every 6 (six) hours as needed for muscle spasms. (Patient not taking: Reported on 10/21/2023) 60 tablet 0    oxyCODONE  (OXY IR/ROXICODONE ) 5 MG immediate release tablet Take 1 tablet (5 mg total) by mouth every 6 (six) hours as needed for severe pain. (Patient not taking: Reported on 10/21/2023) 25 tablet 0    QUEtiapine  (SEROQUEL ) 200 MG tablet Take 1 tablet (200 mg total) by mouth daily at 8  pm. (Patient not taking: Reported on 10/21/2023) 30 tablet 0     Patient Stressors: Other: Homeless    Patient Strengths: Capable of independent living  Communication skills   Treatment Modalities: Medication Management, Group therapy, Case management,  1 to 1 session with clinician, Psychoeducation, Recreational therapy.   Physician Treatment Plan for Primary Diagnosis: Bipolar I disorder, most recent episode depressed (HCC) Long Term Goal(s): Improvement in symptoms so as ready for discharge   Short Term Goals: Ability to identify changes in lifestyle to reduce recurrence of condition will improve Ability to verbalize feelings will improve Ability to disclose and discuss suicidal ideas Ability to demonstrate self-control will improve Ability to identify and develop effective coping behaviors will  improve Ability to maintain clinical measurements within normal limits will improve Compliance with prescribed medications will improve Ability to identify triggers associated with substance abuse/mental health issues will improve  Medication Management: Evaluate patient's response, side effects, and tolerance of medication regimen.  Therapeutic Interventions: 1 to 1 sessions, Unit Group sessions and Medication administration.  Evaluation of Outcomes: Progressing  Physician Treatment Plan for Secondary Diagnosis: Principal Problem:   Bipolar I disorder, most recent episode depressed (HCC) Active Problems:   Mild tetrahydrocannabinol (THC) abuse  Long Term Goal(s): Improvement in symptoms so as ready for discharge   Short Term Goals: Ability to identify changes in lifestyle to reduce recurrence of condition will improve Ability to verbalize feelings will improve Ability to disclose and discuss suicidal ideas Ability to demonstrate self-control will improve Ability to identify and develop effective coping behaviors will improve Ability to maintain clinical measurements within normal limits will improve Compliance with prescribed medications will improve Ability to identify triggers associated with substance abuse/mental health issues will improve     Medication Management: Evaluate patient's response, side effects, and tolerance of medication regimen.  Therapeutic Interventions: 1 to 1 sessions, Unit Group sessions and Medication administration.  Evaluation of Outcomes: Progressing   RN Treatment Plan for Primary Diagnosis: Bipolar I disorder, most recent episode depressed (HCC) Long Term Goal(s): Knowledge of disease and therapeutic regimen to maintain health will improve  Short Term Goals: Ability to demonstrate self-control, Ability to participate in decision making will improve, Ability to verbalize feelings will improve, Ability to disclose and discuss suicidal ideas, Ability  to identify and develop effective coping behaviors will improve, and Compliance with prescribed medications will improve  Medication Management: RN will administer medications as ordered by provider, will assess and evaluate patient's response and provide education to patient for prescribed medication. RN will report any adverse and/or side effects to prescribing provider.  Therapeutic Interventions: 1 on 1 counseling sessions, Psychoeducation, Medication administration, Evaluate responses to treatment, Monitor vital signs and CBGs as ordered, Perform/monitor CIWA, COWS, AIMS and Fall Risk screenings as ordered, Perform wound care treatments as ordered.  Evaluation of Outcomes: Progressing   LCSW Treatment Plan for Primary Diagnosis: Bipolar I disorder, most recent episode depressed (HCC) Long Term Goal(s): Safe transition to appropriate next level of care at discharge, Engage patient in therapeutic group addressing interpersonal concerns.  Short Term Goals: Engage patient in aftercare planning with referrals and resources, Increase social support, Increase ability to appropriately verbalize feelings, Increase emotional regulation, Facilitate acceptance of mental health diagnosis and concerns, Facilitate patient progression through stages of change regarding substance use diagnoses and concerns, Identify triggers associated with mental health/substance abuse issues, and Increase skills for wellness and recovery  Therapeutic Interventions: Assess for all discharge needs, 1 to 1 time with Social  worker, Explore available resources and support systems, Assess for adequacy in community support network, Educate family and significant other(s) on suicide prevention, Complete Psychosocial Assessment, Interpersonal group therapy.  Evaluation of Outcomes: Progressing   Progress in Treatment: Attending groups: No. Participating in groups: No. Taking medication as prescribed: Yes. Toleration medication:  Yes. Family/Significant other contact made: No, will contact:  once permission has been granted. Patient understands diagnosis: Yes. Discussing patient identified problems/goals with staff: Yes. Medical problems stabilized or resolved: Yes. Denies suicidal/homicidal ideation: Yes. Issues/concerns per patient self-inventory: No. Other: none  New problem(s) identified: No, Describe:  none  Update 10/28/2023:  No changes at this time.    New Short Term/Long Term Goal(s):  detox, elimination of symptoms of psychosis, medication management for mood stabilization; elimination of SI thoughts; development of comprehensive mental wellness/sobriety plan. Update 10/28/2023:  No changes at this time.    Patient Goals:  get rid of the thoughts of suicide  Update 10/28/2023:  No changes at this time.    Discharge Plan or Barriers: CSW to assess patient in development of appropriate discharge plans.  Current barriers are patient is unhoused.  Additional barrier is that the patient appears unwilling to address his substance use issues.   Update 10/28/2023:  Patient continues to improve while on the unit.  Patient is no longer stating he has suicidal ideations.  CSW team to continue to assist patient in developing appropriate plans including housing.     Reason for Continuation of Hospitalization: Anxiety Depression Homicidal ideation Medication stabilization Suicidal ideation   Estimated Length of Stay:  1-7 days Update 10/28/2023:  TBD  Last 3 [place] Suicide Severity Risk Score: Flowsheet Row Admission (Current) from 10/22/2023 in [facility] INPATIENT BEHAV[facility]AL MEDICINE ED from 10/21/2023 in [facility] Emergency Department at [facility] ED from 10/22/2021 in [facility] Emergency Department at [facility]  C-SSRS RISK CATEGORY High Risk High Risk No Risk       Last [facility] 2/9 Scores:     No data to display          Scribe for Treatment Team: Sherryle JINNY Margo, KEN 10/28/2023 9:51  AM

## 2023-10-28 NOTE — Group Note (Signed)
 Date:  10/28/2023 Time:  10:48 AM  Group Topic/Focus:  Dimensions of Wellness:   The focus of this group is to introduce the topic of wellness and discuss the role each dimension of wellness plays in total health. Identifying Needs:   The focus of this group is to help patients identify their personal needs that have been historically problematic and identify healthy behaviors to address their needs.    Participation Level:  Did Not Attend   Lurlean Niece 10/28/2023, 10:48 AM

## 2023-10-28 NOTE — Plan of Care (Signed)
 Patient stated this morning that he had nightmares and the voices still there at times. Later today patient states that the voices not bothering him and denies SI,HI and VH. Appetite and energy level good. Support and encouragement given.

## 2023-10-28 NOTE — Group Note (Signed)
 [facility] LCSW Group Therapy Note    Group Date: 10/28/2023 Start Time: 1330 End Time: 1430  Type of Therapy and Topic:  Group Therapy:  Overcoming Obstacles  Participation Level:  [facility] PARTICIPATION LEVEL: Did Not Attend  Mood:  Description of Group:   In this group patients will be encouraged to explore what they see as obstacles to their own wellness and recovery. They will be guided to discuss their thoughts, feelings, and behaviors related to these obstacles. The group will process together ways to cope with barriers, with attention given to specific choices patients can make. Each patient will be challenged to identify changes they are motivated to make in order to overcome their obstacles. This group will be process-oriented, with patients participating in exploration of their own experiences as well as giving and receiving support and challenge from other group members.  Therapeutic Goals: 1. Patient will identify personal and current obstacles as they relate to admission. 2. Patient will identify barriers that currently interfere with their wellness or overcoming obstacles.  3. Patient will identify feelings, thought process and behaviors related to these barriers. 4. Patient will identify two changes they are willing to make to overcome these obstacles:    Summary of Patient Progress Patient did not attend group.   Therapeutic Modalities:   Cognitive Behavioral Therapy Solution Focused Therapy Motivational Interviewing Relapse Prevention Therapy   Sherryle JINNY Margo, LCSW
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
## 2023-10-28 NOTE — Progress Notes (Signed)
   10/27/23 2000  Psych Admission Type (Psych Patients Only)  Admission Status Voluntary  Psychosocial Assessment  Patient Complaints None  Eye Contact Brief  Facial Expression Flat  Affect Appropriate to circumstance  Speech Soft  Interaction Guarded  Motor Activity Slow  Appearance/Hygiene Unremarkable  Behavior Characteristics Cooperative  Mood Depressed;Sad  Aggressive Behavior  Effect No apparent injury  Thought Process  Coherency WDL  Content WDL  Delusions None reported or observed  Perception WDL  Hallucination None reported or observed  Judgment Impaired  Confusion None  Danger to Self  Current suicidal ideation? Denies  Danger to Others  Danger to Others None reported or observed   Patient alert and oriented x 4 no distress noted, he denies SI/HI/AVH, will continue to monitor.

## 2023-10-28 NOTE — Group Note (Signed)
 Recreation Therapy Group Note   Group Topic:Healthy Support Systems  Group Date: 10/28/2023 Start Time: 1010 End Time: 1110 Facilitators: Celestia Jeoffrey BRAVO, LRT, CTRS Location:  Craft Room  Group Description: Straw Bridge. In groups or individually, patients were given 10 plastic drinking straws and an equal length of masking tape. Using the materials provided, patients were instructed to build a free-standing bridge-like structure to suspend an everyday item (ex: deck of cards) off the floor or table surface. All materials were required to be used in secondary school teacher. LRT facilitated post-activity discussion reviewing the importance of having strong and healthy support systems in our lives. LRT discussed how the people in our lives serve as the tape and the deck of cards we placed on top of our straw structure are the stressors we face in daily life. LRT and pts discussed what happens in our life when things get too heavy for us , and we don't have strong supports outside of the hospital. Pt shared 2 of their healthy supports in their life aloud in the group.   Goal Area(s) Addressed:  Patient will identify 2 healthy supports in their life. Patient will identify skills to successfully complete activity. Patient will identify correlation of this activity to life post-discharge.  Patient will build on frustration tolerance skills. Patient will increase team building and communication skills   Affect/Mood: N/A   Participation Level: Did not attend    Clinical Observations/Individualized Feedback: Patient did not attend group.   Plan: Continue to engage patient in RT group sessions 2-3x/week.   Jeoffrey BRAVO Celestia, LRT, CTRS 10/28/2023 12:09 PM

## 2023-10-28 NOTE — Plan of Care (Signed)
  Problem: Education: Goal: Emotional status will improve Outcome: Progressing   Problem: Education: Goal: Mental status will improve Outcome: Progressing   Problem: Education: Goal: Verbalization of understanding the information provided will improve Outcome: Progressing   

## 2023-10-29 ENCOUNTER — Other Ambulatory Visit: Payer: Self-pay

## 2023-10-29 LAB — VALPROIC ACID LEVEL: Valproic Acid Lvl: 64 ug/mL (ref 50.0–100.0)

## 2023-10-29 LAB — AST: AST: 14 U/L — ABNORMAL LOW (ref 15–41)

## 2023-10-29 LAB — ALT: ALT: 10 U/L (ref 0–44)

## 2023-10-29 MED ORDER — NICOTINE 14 MG/24HR TD PT24
14.0000 mg | MEDICATED_PATCH | Freq: Every day | TRANSDERMAL | 0 refills | Status: DC
  Filled 2023-10-29: qty 28, 28d supply, fill #0

## 2023-10-29 MED ORDER — HYDROXYZINE HCL 25 MG PO TABS
25.0000 mg | ORAL_TABLET | Freq: Three times a day (TID) | ORAL | 0 refills | Status: DC | PRN
  Filled 2023-10-29: qty 30, 10d supply, fill #0

## 2023-10-29 MED ORDER — DIVALPROEX SODIUM 500 MG PO DR TAB
500.0000 mg | DELAYED_RELEASE_TABLET | Freq: Two times a day (BID) | ORAL | 0 refills | Status: DC
  Filled 2023-10-29: qty 60, 30d supply, fill #0

## 2023-10-29 MED ORDER — TRAZODONE HCL 100 MG PO TABS
100.0000 mg | ORAL_TABLET | Freq: Every day | ORAL | 0 refills | Status: DC
  Filled 2023-10-29: qty 30, 30d supply, fill #0

## 2023-10-29 MED ORDER — QUETIAPINE FUMARATE 300 MG PO TABS
300.0000 mg | ORAL_TABLET | Freq: Every day | ORAL | 0 refills | Status: DC
  Filled 2023-10-29: qty 30, 30d supply, fill #0

## 2023-10-29 NOTE — Group Note (Signed)
 Recreation Therapy Group Note   Group Topic:Relaxation  Group Date: 10/29/2023 Start Time: 1000 End Time: 1050 Facilitators: Celestia Jeoffrey BRAVO, LRT, CTRS Location:  Craft Room  Group Description: PMR (Progressive Muscle Relaxation). LRT asks patients their current level of stress/anxiety from 1-10, with 10 being the highest. LRT educates patients on what PMR is and the benefits that come from it. Patients are asked to sit with their feet flat on the floor while sitting up and all the way back in their chair, if possible. LRT and pts follow a prompt through a speaker that requires you to tense and release different muscles in their body and focus on their breathing. During session, lights are off and soft music is being played. Pts are given a stress ball to use if needed. At the end of the prompt, LRT asks patients to rank their current levels of stress/anxiety from 1-10, 10 being the highest. LRT provides patients with an education handout on PMR.   Goal Area(s) Addressed:  Patients will be able to describe progressive muscle relaxation.  Patient will practice using relaxation technique. Patient will identify a new coping skill.  Patient will follow multistep directions to reduce anxiety and stress.   Affect/Mood: N/A   Participation Level: Did not attend    Clinical Observations/Individualized Feedback: Patient did not attend group.   Plan: Continue to engage patient in RT group sessions 2-3x/week.   Jeoffrey BRAVO Celestia, LRT, CTRS 10/29/2023 11:38 AM

## 2023-10-29 NOTE — Progress Notes (Addendum)
 Patient ID: Thomas [name], male   DOB: 11/07/1970, 53 y.o.   MRN: [id number]  Discharge Note:  Patient denies SI/HI/AVH at this time. Discharge instructions, AVS, medication supply, and transition record gone over with patient. Patient declined on filling out his Suicide Safety Plan. Patient agrees to comply with medication management, follow-up visit, and outpatient therapy. Patient belongings returned to patient. Patient questions and concerns addressed and answered. Patient ambulatory off unit. Patient discharged to [facility], via [employer].

## 2023-10-29 NOTE — Group Note (Signed)
 Date:  10/29/2023 Time:  12:49 AM  Group Topic/Focus:  Wrap-Up Group:   The focus of this group is to help patients review their daily goal of treatment and discuss progress on daily workbooks.    Participation Level:  Active  Participation Quality:  Appropriate  Affect:  Appropriate  Cognitive:  Appropriate  Insight: Appropriate  Engagement in Group:  Engaged  Modes of Intervention:  Discussion  Additional Comments:    Thomas [name] CHRISTELLA Bunker 10/29/2023, 12:49 AM

## 2023-10-29 NOTE — Progress Notes (Signed)
 Suicide Risk Assessment  Discharge Assessment    [facility] Discharge Suicide Risk Assessment   Principal Problem: Bipolar I disorder, most recent episode depressed (HCC) Discharge Diagnoses: Principal Problem:   Bipolar I disorder, most recent episode depressed (HCC) Active Problems:   Mild tetrahydrocannabinol (THC) abuse   Total Time spent with patient: 20 minutes  Musculoskeletal: Strength & Muscle Tone: within normal limits Gait & Station: normal Patient leans: N/A  Psychiatric Specialty Exam  Presentation  General Appearance:  Appropriate for Environment  Eye Contact: Good  Speech: Clear and Coherent; Normal Rate  Speech Volume: Normal  Handedness:No data recorded  Mood and Affect  Mood: Dysphoric  Duration of Depression Symptoms: No data recorded Affect: Constricted; Congruent   Thought Process  Thought Processes: Coherent  Descriptions of Associations:Intact  Orientation:Full (Time, Place and Person)  Thought Content:Logical  History of Schizophrenia/Schizoaffective disorder:No data recorded Duration of Psychotic Symptoms:No data recorded Hallucinations:Hallucinations: None  Ideas of Reference:None  Suicidal Thoughts:Suicidal Thoughts: No SI Passive Intent and/or Plan: Without Intent; Without Plan  Homicidal Thoughts:Homicidal Thoughts: No   Sensorium  Memory: Immediate Good; Recent Good; Remote Good  Judgment: Fair  Insight: Fair   Art Therapist  Concentration: Good  Attention Span: Good  Recall: Good  Fund of Knowledge: Good  Language: Good   Psychomotor Activity  Psychomotor Activity: Psychomotor Activity: Normal   Assets  Assets: Communication Skills; Physical Health; Resilience   Sleep  Sleep: Sleep: Good   Physical Exam: Physical Exam Vitals and nursing note reviewed.    Review of Systems  Psychiatric/Behavioral:  Positive for depression.   All other systems reviewed and are  negative.  Blood pressure 103/75, pulse 93, temperature (!) 96.8 F (36 C), resp. rate 16, height 5' 9 (1.753 m), weight 68 kg, SpO2 97%. Body mass index is 22.15 kg/m.  Mental Status Per Nursing Assessment::   On Admission:  NA  Demographic Factors:  Male, Low socioeconomic status, and Unemployed  Loss Factors: NA  Historical Factors: NA  Risk Reduction Factors:   Positive coping skills or problem solving skills  Continued Clinical Symptoms:  Depression:   Comorbid alcohol  abuse/dependence Alcohol /Substance Abuse/Dependencies Chronic Pain More than one psychiatric diagnosis Previous Psychiatric Diagnoses and Treatments  Cognitive Features That Contribute To Risk:  None    Suicide Risk:  Minimal: No identifiable suicidal ideation.  Patients presenting with limitedrisk factors but with morbid ruminations; may be classified as minimal risk based on the severity of the depressive symptoms   Follow-up Information     [facility] Follow up.   Why: Virtual appointment is 11/05/23 at 8:30 AM. Contact information: [address]  [address] [place] [address] [phone]                 Plan Of Care/Follow-up recommendations:  Activity:  as tolerated Diet:  heart healthy  Deneen Slager E Kushi Kun, NP 10/29/2023, 11:47 AM

## 2023-10-29 NOTE — Progress Notes (Signed)
 Patient refused scheduled Depakote, stating "I don't want it". Provider will be notified.
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
## 2023-10-29 NOTE — Progress Notes (Signed)
   10/29/23 1000  Psych Admission Type (Psych Patients Only)  Admission Status Voluntary  Psychosocial Assessment  Patient Complaints Sleep disturbance;Other (Comment) (lower back pain, per patient)  Eye Contact Brief  Facial Expression Flat  Affect Appropriate to circumstance  Speech Logical/coherent;Soft  Interaction Isolative (Patient isolates to room, except for meals and medication.)  Motor Activity Slow  Appearance/Hygiene In scrubs  Mood Pleasant  Aggressive Behavior  Effect No apparent injury  Thought Process  Coherency WDL  Content WDL  Delusions None reported or observed  Perception WDL  Hallucination None reported or observed  Judgment WDL  Confusion None  Danger to Self  Current suicidal ideation? Denies (patient states not today.)  Danger to Others  Danger to Others None reported or observed

## 2023-10-29 NOTE — Group Note (Signed)
 Date:  10/29/2023 Time:  10:11 AM  Group Topic/Focus:  Goals Group:   The focus of this group is to help patients establish daily goals to achieve during treatment and discuss how the patient can incorporate goal setting into their daily lives to aide in recovery.    Participation Level:  Did Not Attend   [name] [facility] 10/29/2023, 10:11 AM

## 2023-10-29 NOTE — Progress Notes (Signed)
  [facility] Adult Case Management Discharge Plan :  Will you be returning to the same living situation after discharge:  Yes,  Patient to return to the [facility] in [place], [place]. At discharge, do you have transportation home?: Yes,  CSW to asssist wit transportation needs.  Do you have the ability to pay for your medications: Yes, [facility]   Release of information consent forms completed and in the chart;  Patient's signature needed at discharge.  Patient to Follow up at:  Follow-up Information     [facility] Follow up.   Why: Virtual appointment is 11/05/23 at 8:30 AM. Contact information: [address]  [address] [place] [address] [phone]                 Next level of care provider has access to [facility] Link:no  Safety Planning and Suicide Prevention discussed: No. Patient refused. SPE completed with pt, as pt refused to consent to family contact. SPI pamphlet provided to pt and pt was encouraged to share information with support network, ask questions, and talk about any concerns relating to SPE. Pt denies access to guns/firearms and verbalized understanding of information provided. Mobile Crisis information also provided to pt.  SPE material provided at discharge.      Has patient been referred to the Quitline?: Patient refused referral for treatment  Patient has been referred for addiction treatment: Patient refused referral for treatment; referral information given to patient at discharge.  Alveta CHRISTELLA Kerns, LCSW 10/29/2023, 9:42 AM

## 2023-10-29 NOTE — Plan of Care (Signed)
  Problem: Education: Goal: Knowledge of [facility] Education information/materials will improve Outcome: Progressing Goal: Emotional status will improve Outcome: Progressing Goal: Mental status will improve Outcome: Progressing Goal: Verbalization of understanding the information provided will improve Outcome: Progressing   Problem: Activity: Goal: Interest or engagement in activities will improve Outcome: Progressing Goal: Sleeping patterns will improve Outcome: Progressing   Problem: Coping: Goal: Ability to verbalize frustrations and anger appropriately will improve Outcome: Progressing Goal: Ability to demonstrate self-control will improve Outcome: Progressing   Problem: Health Behavior/Discharge Planning: Goal: Identification of resources available to assist in meeting health care needs will improve Outcome: Progressing Goal: Compliance with treatment plan for underlying cause of condition will improve Outcome: Progressing   Problem: Physical Regulation: Goal: Ability to maintain clinical measurements within normal limits will improve Outcome: Progressing   Problem: Safety: Goal: Periods of time without injury will increase Outcome: Progressing   Problem: Education: Goal: Ability to make informed decisions regarding treatment will improve Outcome: Progressing   Problem: Coping: Goal: Coping ability will improve Outcome: Progressing   Problem: Health Behavior/Discharge Planning: Goal: Identification of resources available to assist in meeting health care needs will improve Outcome: Progressing   Problem: Medication: Goal: Compliance with prescribed medication regimen will improve Outcome: Progressing   Problem: Self-Concept: Goal: Ability to disclose and discuss suicidal ideas will improve Outcome: Progressing

## 2023-11-05 ENCOUNTER — Emergency Department (HOSPITAL_COMMUNITY)
Admission: EM | Admit: 2023-11-05 | Discharge: 2023-11-07 | Disposition: A | Discharge status: Other Acute Inpatient Hospital | Payer: 59 | Attending: Emergency Medicine | Admitting: Emergency Medicine

## 2023-11-05 ENCOUNTER — Other Ambulatory Visit: Payer: Self-pay

## 2023-11-05 ENCOUNTER — Encounter (HOSPITAL_COMMUNITY): Payer: Self-pay

## 2023-11-05 DIAGNOSIS — Z20822 Contact with and (suspected) exposure to covid-19: Secondary | ICD-10-CM | POA: Diagnosis not present

## 2023-11-05 DIAGNOSIS — F1420 Cocaine dependence, uncomplicated: Secondary | ICD-10-CM | POA: Diagnosis present

## 2023-11-05 DIAGNOSIS — F1910 Other psychoactive substance abuse, uncomplicated: Secondary | ICD-10-CM | POA: Diagnosis not present

## 2023-11-05 DIAGNOSIS — F3130 Bipolar disorder, current episode depressed, mild or moderate severity, unspecified: Secondary | ICD-10-CM | POA: Diagnosis not present

## 2023-11-05 DIAGNOSIS — F1914 Other psychoactive substance abuse with psychoactive substance-induced mood disorder: Secondary | ICD-10-CM | POA: Insufficient documentation

## 2023-11-05 DIAGNOSIS — F1410 Cocaine abuse, uncomplicated: Secondary | ICD-10-CM | POA: Insufficient documentation

## 2023-11-05 DIAGNOSIS — F1994 Other psychoactive substance use, unspecified with psychoactive substance-induced mood disorder: Secondary | ICD-10-CM | POA: Diagnosis present

## 2023-11-05 DIAGNOSIS — R45851 Suicidal ideations: Secondary | ICD-10-CM | POA: Diagnosis not present

## 2023-11-05 DIAGNOSIS — R45850 Homicidal ideations: Secondary | ICD-10-CM | POA: Diagnosis not present

## 2023-11-05 DIAGNOSIS — F1220 Cannabis dependence, uncomplicated: Secondary | ICD-10-CM | POA: Diagnosis not present

## 2023-11-05 DIAGNOSIS — F1210 Cannabis abuse, uncomplicated: Secondary | ICD-10-CM | POA: Diagnosis present

## 2023-11-05 DIAGNOSIS — T1491XA Suicide attempt, initial encounter: Secondary | ICD-10-CM | POA: Diagnosis present

## 2023-11-05 DIAGNOSIS — R000 Tachycardia, unspecified: Secondary | ICD-10-CM | POA: Diagnosis not present

## 2023-11-05 LAB — CBC
HCT: 43.1 % (ref 39.0–52.0)
Hemoglobin: 14 g/dL (ref 13.0–17.0)
MCH: 29.4 pg (ref 26.0–34.0)
MCHC: 32.5 g/dL (ref 30.0–36.0)
MCV: 90.5 fL (ref 80.0–100.0)
Platelets: 213 10*3/uL (ref 150–400)
RBC: 4.76 MIL/uL (ref 4.22–5.81)
RDW: 13.9 % (ref 11.5–15.5)
WBC: 4.1 10*3/uL (ref 4.0–10.5)
nRBC: 0 % (ref 0.0–0.2)

## 2023-11-05 LAB — COMPREHENSIVE METABOLIC PANEL
ALT: 16 U/L (ref 0–44)
AST: 26 U/L (ref 15–41)
Albumin: 3.7 g/dL (ref 3.5–5.0)
Alkaline Phosphatase: 80 U/L (ref 38–126)
Anion gap: 15 (ref 5–15)
BUN: 13 mg/dL (ref 6–20)
CO2: 21 mmol/L — ABNORMAL LOW (ref 22–32)
Calcium: 8.7 mg/dL — ABNORMAL LOW (ref 8.9–10.3)
Chloride: 98 mmol/L (ref 98–111)
Creatinine, Ser: 1.15 mg/dL (ref 0.61–1.24)
GFR, Estimated: >60 mL/min (ref >60)
Glucose, Bld: 97 mg/dL (ref 70–99)
Potassium: 3.9 mmol/L (ref 3.5–5.1)
Sodium: 134 mmol/L — ABNORMAL LOW (ref 135–145)
Total Bilirubin: 0.7 mg/dL (ref 0.0–1.2)
Total Protein: 8.3 g/dL — ABNORMAL HIGH (ref 6.5–8.1)

## 2023-11-05 LAB — RESP PANEL BY RT-PCR (RSV, FLU A&B, COVID)  RVPGX2
Influenza A by PCR: POSITIVE — AB
Influenza B by PCR: NEGATIVE
Resp Syncytial Virus by PCR: NEGATIVE
SARS Coronavirus 2 by RT PCR: NEGATIVE

## 2023-11-05 LAB — ETHANOL: Alcohol, Ethyl (B): <10 mg/dL (ref <10)

## 2023-11-05 LAB — RAPID URINE DRUG SCREEN, HOSP PERFORMED
Amphetamines: NOT DETECTED
Barbiturates: NOT DETECTED
Benzodiazepines: NOT DETECTED
Cocaine: POSITIVE — AB
Opiates: NOT DETECTED
Tetrahydrocannabinol: POSITIVE — AB

## 2023-11-05 LAB — SALICYLATE LEVEL: Salicylate Lvl: <7 mg/dL — ABNORMAL LOW (ref 7.0–30.0)

## 2023-11-05 LAB — ACETAMINOPHEN LEVEL: Acetaminophen (Tylenol), Serum: <10 ug/mL — ABNORMAL LOW (ref 10–30)

## 2023-11-05 MED ORDER — ACETAMINOPHEN 325 MG PO TABS
650.0000 mg | ORAL_TABLET | Freq: Once | ORAL | Status: AC | PRN
  Administered 2023-11-05: 650 mg via ORAL
  Filled 2023-11-05: qty 2

## 2023-11-05 MED ORDER — ACETAMINOPHEN 325 MG PO TABS
650.0000 mg | ORAL_TABLET | Freq: Once | ORAL | Status: AC | PRN
Start: 2023-11-05 — End: 2023-11-05
  Administered 2023-11-05: 650 mg via ORAL
  Filled 2023-11-05: qty 2

## 2023-11-05 MED ORDER — IBUPROFEN 400 MG PO TABS
600.0000 mg | ORAL_TABLET | Freq: Four times a day (QID) | ORAL | Status: DC | PRN
  Administered 2023-11-05: 600 mg via ORAL
  Filled 2023-11-05: qty 1

## 2023-11-05 NOTE — ED Triage Notes (Addendum)
Pt states he feels like he wants to hurt himself by jumping in front of a car. Pt endorses HI as well. Pt denies AVH, but states he has dreams where he is awake and they feel real. Pt states he has not been able to get his medication since he was discharged and feels agitated.  Pt states he has also had a runny nose, cough and aching.

## 2023-11-05 NOTE — ED Notes (Signed)
Pt belongings placed in locker number 5 

## 2023-11-05 NOTE — ED Provider Triage Note (Signed)
Emergency Medicine Provider Triage Evaluation Note  JKOBE CONNIFF , a 53 y.o. male  was evaluated in triage.  Pt complains of SI, HI.  Review of Systems  Positive: Depression, isolating, SI, HI Negative: AVH  Physical Exam  BP 130/83 (BP Location: Right Arm)   Pulse 99   Temp (!) 100.6 F (38.1 C)   Resp 18   Ht 5\' 9"  (1.753 m)   Wt 68.9 kg   SpO2 100%   BMI 22.45 kg/m  Gen:   Awake, no distress   Resp:  Normal effort CTA MSK:   Moves extremities without difficulty  Other:    Medical Decision Making  Medically screening exam initiated at 7:24 AM.  Appropriate orders placed.  VYNCENT RESNER was informed that the remainder of the evaluation will be completed by another provider, this initial triage assessment does not replace that evaluation, and the importance of remaining in the ED until their evaluation is complete.  Patient with SI/HI x 3 days. No hallucinations but reports "wake dreams". Also reports cough and nasal congestion.    Elpidio Anis, PA-C 11/05/23 0725

## 2023-11-05 NOTE — ED Provider Notes (Signed)
[facility] AT [facility] Provider Note   CSN: [id number] Arrival date & time: 11/05/23  4742     History  Chief Complaint  Patient presents with   Suicidal    Thomas [name] is a 53 y.o. male.  Patient to ED reporting SI/HI without AVH that started 2-3 days ago. Reports he has been feeling more and more like he doesn't want to be around people. He also reports nasal congestion and cough for several days. No vomiting, diarrhea.   The history is provided by the patient. No language interpreter was used.       Home Medications Prior to Admission medications   Medication Sig Start Date End Date Taking? Authorizing Provider  QUEtiapine (SEROQUEL) 300 MG tablet Take 1 tablet (300 mg total) by mouth at bedtime. 10/29/23  Yes Remington, Amber E, NP  traZODone (DESYREL) 100 MG tablet Take 1 tablet (100 mg total) by mouth at bedtime. 10/29/23  Yes Remington, Amber E, NP  divalproex (DEPAKOTE) 500 MG DR tablet Take 1 tablet (500 mg total) by mouth every 12 (twelve) hours. Patient not taking: Reported on 11/05/2023 10/29/23   Lovette Cliche, NP  hydrOXYzine (ATARAX) 25 MG tablet Take 1 tablet (25 mg total) by mouth 3 (three) times daily as needed for anxiety. Patient not taking: Reported on 11/05/2023 10/29/23   Darrold Junker E, NP  nicotine (NICODERM CQ - DOSED IN MG/24 HOURS) 14 mg/24hr patch Place 1 patch (14 mg total) onto the skin daily. Patient not taking: Reported on 11/05/2023 10/30/23   Lovette Cliche, NP      Allergies    Patient has no known allergies.    Review of Systems   Review of Systems  Physical Exam Updated Vital Signs BP 130/83 (BP Location: Right Arm)   Pulse 99   Temp 100.1 F (37.8 C) (Oral)   Resp 18   Ht 5\' 9"  (1.753 m)   Wt 68.9 kg   SpO2 100%   BMI 22.45 kg/m  Physical Exam Constitutional:      Appearance: He is well-developed.  HENT:     Head: Normocephalic.  Cardiovascular:     Rate and Rhythm: Normal  rate and regular rhythm.     Heart sounds: No murmur heard. Pulmonary:     Effort: Pulmonary effort is normal.     Breath sounds: Normal breath sounds. No wheezing, rhonchi or rales.  Abdominal:     General: Bowel sounds are normal.     Palpations: Abdomen is soft.     Tenderness: There is no abdominal tenderness. There is no guarding or rebound.  Musculoskeletal:        General: Normal range of motion.     Cervical back: Normal range of motion and neck supple.  Skin:    General: Skin is warm and dry.  Neurological:     General: No focal deficit present.     Mental Status: He is alert and oriented to person, place, and time.  Psychiatric:        Attention and Perception: He does not perceive auditory or visual hallucinations.        Mood and Affect: Mood is depressed.        Speech: Speech normal.        Behavior: Behavior normal. Behavior is cooperative.        Thought Content: Thought content includes homicidal and suicidal ideation.     ED Results / Procedures / Treatments  Labs (all labs ordered are listed, but only abnormal results are displayed) Labs Reviewed  RESP PANEL BY RT-PCR (RSV, FLU A&B, COVID)  RVPGX2 - Abnormal; Notable for the following components:      Result Value   Influenza A by PCR POSITIVE (*)    All other components within normal limits  COMPREHENSIVE METABOLIC PANEL - Abnormal; Notable for the following components:   Sodium 134 (*)    CO2 21 (*)    Calcium 8.7 (*)    Total Protein 8.3 (*)    All other components within normal limits  SALICYLATE LEVEL - Abnormal; Notable for the following components:   Salicylate Lvl <7.0 (*)    All other components within normal limits  ACETAMINOPHEN LEVEL - Abnormal; Notable for the following components:   Acetaminophen (Tylenol), Serum <10 (*)    All other components within normal limits  ETHANOL  CBC  RAPID URINE DRUG SCREEN, HOSP PERFORMED   Results for orders placed or performed during the hospital  encounter of 11/05/23  Resp panel by RT-PCR (RSV, Flu A&B, Covid) Anterior Nasal Swab   Collection Time: 11/05/23  6:26 AM   Specimen: Anterior Nasal Swab  Result Value Ref Range   SARS Coronavirus 2 by RT PCR NEGATIVE NEGATIVE   Influenza A by PCR POSITIVE (A) NEGATIVE   Influenza B by PCR NEGATIVE NEGATIVE   Resp Syncytial Virus by PCR NEGATIVE NEGATIVE  Comprehensive metabolic panel   Collection Time: 11/05/23  6:30 AM  Result Value Ref Range   Sodium 134 (L) 135 - 145 mmol/L   Potassium 3.9 3.5 - 5.1 mmol/L   Chloride 98 98 - 111 mmol/L   CO2 21 (L) 22 - 32 mmol/L   Glucose, Bld 97 70 - 99 mg/dL   BUN 13 6 - 20 mg/dL   Creatinine, Ser 1.61 0.61 - 1.24 mg/dL   Calcium 8.7 (L) 8.9 - 10.3 mg/dL   Total Protein 8.3 (H) 6.5 - 8.1 g/dL   Albumin 3.7 3.5 - 5.0 g/dL   AST 26 15 - 41 U/L   ALT 16 0 - 44 U/L   Alkaline Phosphatase 80 38 - 126 U/L   Total Bilirubin 0.7 0.0 - 1.2 mg/dL   GFR, Estimated >09 >60 mL/min   Anion gap 15 5 - 15  Ethanol   Collection Time: 11/05/23  6:30 AM  Result Value Ref Range   Alcohol, Ethyl (B) <10 <10 mg/dL  Salicylate level   Collection Time: 11/05/23  6:30 AM  Result Value Ref Range   Salicylate Lvl <7.0 (L) 7.0 - 30.0 mg/dL  Acetaminophen level   Collection Time: 11/05/23  6:30 AM  Result Value Ref Range   Acetaminophen (Tylenol), Serum <10 (L) 10 - 30 ug/mL  cbc   Collection Time: 11/05/23  6:30 AM  Result Value Ref Range   WBC 4.1 4.0 - 10.5 K/uL   RBC 4.76 4.22 - 5.81 MIL/uL   Hemoglobin 14.0 13.0 - 17.0 g/dL   HCT 45.4 09.8 - 11.9 %   MCV 90.5 80.0 - 100.0 fL   MCH 29.4 26.0 - 34.0 pg   MCHC 32.5 30.0 - 36.0 g/dL   RDW 14.7 82.9 - 56.2 %   Platelets 213 150 - 400 K/uL   nRBC 0.0 0.0 - 0.2 %     EKG EKG Interpretation Date/Time:  Tuesday November 05 2023 10:14:24 EST Ventricular Rate:  100 PR Interval:  162 QRS Duration:  98 QT Interval:  422 QTC  Calculation: 544 R Axis:   70  Text Interpretation: Normal sinus rhythm  Prolonged QT Abnormal ECG When compared with ECG of 21-Oct-2023 11:39, PREVIOUS ECG IS PRESENT Confirmed by Elayne Snare ([id number]) on 11/05/2023 11:39:26 AM  Radiology No results found.  Procedures Procedures    Medications Ordered in ED Medications  acetaminophen (TYLENOL) tablet 650 mg (650 mg Oral Given 11/05/23 0630)    ED Course/ Medical Decision Making/ A&P Clinical Course as of 11/05/23 1156  Tue Nov 05, 2023  0753 Patient with SI/HI w/o AVH. Reports wanting to isolate away from everyone. Also with cough - had low grade fever on arrival.  [SU]  0754 Labs reviewed. Viral panel pending. Patient appears stable for TTS consultation to determine appropriate disposition. Will review viral panel when resulted. [SU]  [id number] Patient tested positive for Influenza A. Negative for COVID. He is considered medically cleared for psychiatric evaluation.  [SU]    Clinical Course User Index [SU] Elpidio Anis, PA-C                                 Medical Decision Making Amount and/or Complexity of Data Reviewed Labs: ordered.  Risk OTC drugs.           Final Clinical Impression(s) / ED Diagnoses Final diagnoses:  Suicidal ideation  Homicidal behavior    Rx / DC Orders ED Discharge Orders     None         Elpidio Anis, PA-C 11/05/23 1156    Elayne Snare K, DO 11/05/23 1523

## 2023-11-05 NOTE — ED Notes (Signed)
Mask provided to patient.

## 2023-11-05 NOTE — ED Notes (Signed)
Attempted report to [facility], got in touch w/ Elva RN who is going to talk to their intake people because pt has flu. RN to call this RN back.

## 2023-11-05 NOTE — Progress Notes (Signed)
LCSW Progress Note  [id number]   Thomas [name]  11/05/2023  12:44 PM  Description:   Inpatient Psychiatric Referral  Patient was recommended inpatient per Ophelia Shoulder NP There are no available beds at [facility], per [facility] Rona Ravens RN. Patient was referred to the following out of network facilities:    Destination  Service Provider Address Phone Fax  [facility] [address] [place] [address] [phone]  [facility] [address] [place], [place] [address] [phone]  [facility] [address] [place] [address] [phone]  [facility] [address] [place] [address] [phone]  [facility] [address] [place] [address] [phone]  [facility] [address], [place] [address] [phone]  [facility] [address] [place] [address] [phone]  [facility] [address] [place] [address] [phone]  [facility] EFAX [address] [place], [place] [phone]  [facility] [address] [place] [address] [phone]  [facility] [address], [place] [address] [phone]  [facility] [address] [place] [address] [phone]  [facility] [address] [place], [place] [address] [phone]  [facility] Patient Placement [facility], [place] [phone]  [facility] [address], [place] [address] [phone]     Situation ongoing, CSW to continue following  and update chart as more information becomes available.       [place] Ruchama Kubicek, MSW, LCSW  11/05/2023 12:44 PM

## 2023-11-05 NOTE — Progress Notes (Addendum)
Pt has been accepted to [facility] on 11/05/2023 Bed assignment: [facility] [address]   Pt meets inpatient criteria per: Ophelia Shoulder NP  Attending Physician will be: Dr. Forrestine Him MD   Report can be called to: [phone]  Pt can arrive ASAP   Care Team Notified: Ophelia Shoulder NP, [place] Ar RN  [place] Ayanna Gheen LCSW-A   11/05/2023 1:05 PM
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
## 2023-11-05 NOTE — ED Notes (Signed)
Belongings placed in locker #5 

## 2023-11-05 NOTE — Progress Notes (Signed)
CSW has spoke to Intake at [facility], at this time they are unable to take patient due to the patient having the Flu. Nurse and Provider made aware.   [place] Thomas Kloehn LCSW-A   11/05/2023 1:48 PM

## 2023-11-05 NOTE — ED Notes (Signed)
Notified EDP of temp 103.7

## 2023-11-05 NOTE — ED Notes (Signed)
Attempted report x1. 

## 2023-11-06 ENCOUNTER — Encounter (HOSPITAL_COMMUNITY): Payer: Self-pay | Admitting: Psychiatry

## 2023-11-06 DIAGNOSIS — F1420 Cocaine dependence, uncomplicated: Secondary | ICD-10-CM | POA: Diagnosis present

## 2023-11-06 DIAGNOSIS — F1220 Cannabis dependence, uncomplicated: Secondary | ICD-10-CM | POA: Diagnosis not present

## 2023-11-06 DIAGNOSIS — F1994 Other psychoactive substance use, unspecified with psychoactive substance-induced mood disorder: Secondary | ICD-10-CM | POA: Diagnosis not present

## 2023-11-06 MED ORDER — QUETIAPINE FUMARATE 50 MG PO TABS: 150.0000 mg | ORAL_TABLET | Freq: Every day | ORAL | Status: DC

## 2023-11-06 MED ORDER — ACETAMINOPHEN 325 MG PO TABS
650.0000 mg | ORAL_TABLET | Freq: Once | ORAL | Status: AC | PRN
  Administered 2023-11-06: 650 mg via ORAL
  Filled 2023-11-06: qty 2

## 2023-11-06 MED ORDER — QUETIAPINE FUMARATE 50 MG PO TABS
150.0000 mg | ORAL_TABLET | Freq: Once | ORAL | Status: AC
  Administered 2023-11-06: 150 mg via ORAL
  Filled 2023-11-06: qty 1

## 2023-11-06 MED ORDER — TRAZODONE HCL 100 MG PO TABS
100.0000 mg | ORAL_TABLET | Freq: Every evening | ORAL | Status: DC | PRN
  Administered 2023-11-06: 100 mg via ORAL
  Filled 2023-11-06: qty 1

## 2023-11-06 MED ORDER — QUETIAPINE FUMARATE 100 MG PO TABS: 300.0000 mg | ORAL_TABLET | Freq: Every day | ORAL | Status: DC

## 2023-11-06 MED ORDER — DIVALPROEX SODIUM 500 MG PO DR TAB
500.0000 mg | DELAYED_RELEASE_TABLET | Freq: Two times a day (BID) | ORAL | Status: DC
  Administered 2023-11-06 – 2023-11-07 (×3): 500 mg via ORAL
  Filled 2023-11-06 (×3): qty 1

## 2023-11-06 NOTE — ED Provider Notes (Signed)
Emergency Medicine Observation Re-evaluation Note  RYOSUKE DENATALE is a 53 y.o. male, seen on rounds today.  Pt initially presented to the ED for complaints of Suicidal Currently, the patient is sleeping.  Physical Exam  BP 125/79 (BP Location: Right Arm)   Pulse 100   Temp (!) 101.3 F (38.5 C) (Oral) Comment: Notified Monique RN  Resp 18   Ht 5\' 9"  (1.753 m)   Wt 68.9 kg   SpO2 96%   BMI 22.45 kg/m  Physical Exam General: NAD  Cardiac: regular rate Lungs: equal chest rise Psych: calm  ED Course / MDM  EKG:EKG Interpretation Date/Time:  Tuesday November 05 2023 10:14:24 EST Ventricular Rate:  100 PR Interval:  162 QRS Duration:  98 QT Interval:  422 QTC Calculation: 544 R Axis:   70  Text Interpretation: Normal sinus rhythm Prolonged QT Abnormal ECG When compared with ECG of 21-Oct-2023 11:39, PREVIOUS ECG IS PRESENT Confirmed by Elayne Snare ([id number]) on 11/05/2023 11:39:26 AM  I have reviewed the labs performed to date as well as medications administered while in observation.  Recent changes in the last 24 hours include placement difficulty due to influenza .  Plan  Current plan is for psychiatric placement. Ordered additional tylenol for fever due to influenza.    Lonell Grandchild, MD 11/06/23 [phone]
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
## 2023-11-06 NOTE — Progress Notes (Signed)
LCSW Progress Note  [id number]   [name]  11/06/2023  11:40 AM  Description:   Inpatient Psychiatric Referral  Patient was recommended inpatient per Arsenio Loader NP.There are no available beds at [facility], per [facility] Rona Ravens RN. Patient was referred to the following out of network facilities:    Destination  Service Provider Address Phone Fax  [facility] [address] [place] [address] [phone]  [facility] [address] Garcia, [place] [address] [phone]  [facility] [address] [place] [address] [phone]  [facility] [address] [place] [address] [phone]  [facility] [address] [place] [address] [phone]  [facility] [address] [place] [address] [phone]  [facility] [address], [place] [address] [phone]  [facility] [address] [place] [address] [phone]  [facility] EFAX [address] [place], [place] [phone]  [facility] [address] [place] [address] [phone]  [facility] [address] [place] [address] [phone]  [facility] [address] [place] [address] [phone]  [facility] [address] [place], [place] [address] [phone]  [facility] Patient Placement [facility], [place] [phone]  [facility] [address], [place] [address] [phone]      Situation ongoing, CSW to continue  following and update chart as more information becomes available.      Thomas [name], MSW, LCSW  11/06/2023 11:40 AM

## 2023-11-06 NOTE — Progress Notes (Signed)
Patient still has the FLU, patient will remain in the ED, however, if a single room becomes available at [facility], [facility] will take the patient.   [place] Celedonio Sortino LCSW-A   11/06/2023 3:04 PM

## 2023-11-07 DIAGNOSIS — Z20822 Contact with and (suspected) exposure to covid-19: Secondary | ICD-10-CM | POA: Diagnosis not present

## 2023-11-07 DIAGNOSIS — F1410 Cocaine abuse, uncomplicated: Secondary | ICD-10-CM | POA: Diagnosis not present

## 2023-11-07 DIAGNOSIS — M5459 Other low back pain: Secondary | ICD-10-CM | POA: Diagnosis not present

## 2023-11-07 DIAGNOSIS — F1910 Other psychoactive substance abuse, uncomplicated: Secondary | ICD-10-CM | POA: Diagnosis not present

## 2023-11-07 DIAGNOSIS — R45850 Homicidal ideations: Secondary | ICD-10-CM | POA: Diagnosis not present

## 2023-11-07 DIAGNOSIS — Z5900 Homelessness unspecified: Secondary | ICD-10-CM | POA: Diagnosis not present

## 2023-11-07 DIAGNOSIS — F319 Bipolar disorder, unspecified: Secondary | ICD-10-CM | POA: Diagnosis not present

## 2023-11-07 DIAGNOSIS — F17210 Nicotine dependence, cigarettes, uncomplicated: Secondary | ICD-10-CM | POA: Diagnosis not present

## 2023-11-07 DIAGNOSIS — F339 Major depressive disorder, recurrent, unspecified: Secondary | ICD-10-CM | POA: Diagnosis not present

## 2023-11-07 DIAGNOSIS — F3130 Bipolar disorder, current episode depressed, mild or moderate severity, unspecified: Secondary | ICD-10-CM | POA: Diagnosis not present

## 2023-11-07 DIAGNOSIS — Z765 Malingerer [conscious simulation]: Secondary | ICD-10-CM | POA: Diagnosis not present

## 2023-11-07 DIAGNOSIS — R45851 Suicidal ideations: Secondary | ICD-10-CM | POA: Diagnosis not present

## 2023-11-07 DIAGNOSIS — F1914 Other psychoactive substance abuse with psychoactive substance-induced mood disorder: Secondary | ICD-10-CM | POA: Diagnosis not present

## 2023-11-07 DIAGNOSIS — F1210 Cannabis abuse, uncomplicated: Secondary | ICD-10-CM | POA: Diagnosis not present

## 2023-11-07 NOTE — Progress Notes (Signed)
Pt was accepted to [facility] 11/07/2023; Bed Assignment [facility] Fax Number: [phone] (Adult)   Pt meets inpatient criteria per Shearon Stalls    Attending Physician will be Dr. Phyllis Ginger. [place], MD    Report can be called [phone]-Pager number, please leave a returned phone number to receive a phone call back.   Pt can arrive after 11:00am   Care Team notified: Su Hilt [place],LCSWA  Maryjean Ka, MSW, [facility] 11/07/2023 2:06 AM

## 2023-11-07 NOTE — ED Provider Notes (Addendum)
Emergency Medicine Observation Re-evaluation Note  Thomas [name] is a 53 y.o. male, seen on rounds today.  Pt initially presented to the ED for complaints of Suicidal Currently, the patient is resting comfortably asleep.  Physical Exam  BP 132/83 (BP Location: Right Arm)   Pulse 93   Temp 98.2 F (36.8 C) (Oral)   Resp 18   Ht 5\' 9"  (1.753 m)   Wt 68.9 kg   SpO2 99%   BMI 22.45 kg/m  Physical Exam General: Resting without acute distress Cardiac: Not tachycardic on last vitals Lungs: Symmetric rise and fall of chest without respiratory distress or increased work of breathing Psych: No agitation at this time  ED Course / MDM  EKG:EKG Interpretation Date/Time:  Wednesday November 06 2023 09:24:12 EST Ventricular Rate:  103 PR Interval:  168 QRS Duration:  94 QT Interval:  364 QTC Calculation: 476 R Axis:   54  Text Interpretation: Sinus tachycardia Nonspecific ST abnormality Abnormal ECG When compared with ECG of 05-Nov-2023 10:14, QT has shortened Confirmed by Dione Booze ([id number]) on 11/07/2023 3:28:52 AM  I have reviewed the labs performed to date as well as medications administered while in observation.  Recent changes in the last 24 hours include none reported by nursing.  Fevers have improved after antipyretic medications yesterday due to influenza.  Plan  Current plan is for awaiting placement.    Byrd Rushlow, Canary Brim, MD 11/07/23 [phone]  11:20 AM Was informed that patient was accepted to [facility] under care of Dr. Loraine Leriche P. [place].  Patient will be transported and EMTALA will be filled out.    Rashanda Magloire, Canary Brim, MD 11/07/23 1121

## 2023-11-07 NOTE — Progress Notes (Signed)
LCSW Progress Note  [id number]   Thomas [name]  11/07/2023  1:56 AM    Inpatient Behavioral Health Placement  Pt meets inpatient criteria per Shearon Stalls. There are no available beds within [facility] per [facility] Rona Ravens, RN. Referral was sent to the following facilities;   Destination  Service Provider Address Phone Fax  [facility] [address] [place] [address] [phone]  [facility] [address] [place], [place] [address] [phone]  [facility] [address] [place] [address] [phone]  [facility] [address], [place] [address] [phone]  [facility] [address], [place] [address] [phone]  [facility] [address] [place] [address] [phone]  [facility] [address], [place] [address] [phone]  [facility] [address] [place] [address] [phone]  [facility] EFAX [address] [place], [place] [phone]  [facility] [address] [place] [address] [phone]  [facility] [address], [place] [address] [phone]  [facility] [address] [place], [place] [address] [phone]  [facility] Patient Placement [facility], [place] [phone]  [facility] [address], [place] [address] [phone]  [facility] [address] [place], [place] [address] [phone]  [facility] [address] [place],  [place] [address] [phone]  [facility] [address], [place] [address] [phone]  [facility] [phone]  [facility] [address] [place] [address] [phone]  [facility] [address], [place] [address] [phone]  [facility] [address] [place] [address] [phone]  [facility] [address] [place] [address] [phone]  [facility] [place] [phone]  [facility] [address], [place] [address] [phone]  [facility] [address] [place] [address] [phone]  [facility] [address] [place], [place] [address] [phone]  [facility] [address], [place] [address] [phone]  [facility] [address], [place] [address] [phone]    Situation ongoing,  CSW will follow up.    Maryjean Ka, MSW, LCSWA 11/07/2023 1:56 AM

## 2024-06-13 ENCOUNTER — Other Ambulatory Visit: Payer: Self-pay

## 2024-06-13 ENCOUNTER — Emergency Department (HOSPITAL_COMMUNITY)
Admission: EM | Admit: 2024-06-13 | Discharge: 2024-06-14 | Disposition: A | Discharge status: Home / Self Care | Attending: Emergency Medicine | Admitting: Emergency Medicine

## 2024-06-13 DIAGNOSIS — F1220 Cannabis dependence, uncomplicated: Secondary | ICD-10-CM | POA: Diagnosis not present

## 2024-06-13 DIAGNOSIS — F10129 Alcohol abuse with intoxication, unspecified: Secondary | ICD-10-CM | POA: Diagnosis not present

## 2024-06-13 DIAGNOSIS — F10120 Alcohol abuse with intoxication, uncomplicated: Secondary | ICD-10-CM | POA: Diagnosis present

## 2024-06-13 DIAGNOSIS — F1420 Cocaine dependence, uncomplicated: Secondary | ICD-10-CM | POA: Diagnosis present

## 2024-06-13 DIAGNOSIS — F1994 Other psychoactive substance use, unspecified with psychoactive substance-induced mood disorder: Secondary | ICD-10-CM | POA: Diagnosis present

## 2024-06-13 DIAGNOSIS — F17210 Nicotine dependence, cigarettes, uncomplicated: Secondary | ICD-10-CM | POA: Diagnosis not present

## 2024-06-13 DIAGNOSIS — Y907 Blood alcohol level of 200-239 mg/100 ml: Secondary | ICD-10-CM | POA: Insufficient documentation

## 2024-06-13 DIAGNOSIS — F10929 Alcohol use, unspecified with intoxication, unspecified: Secondary | ICD-10-CM

## 2024-06-13 MED ORDER — ZIPRASIDONE MESYLATE 20 MG IM SOLR
20.0000 mg | Freq: Once | INTRAMUSCULAR | Status: AC
  Administered 2024-06-13: 20 mg via INTRAMUSCULAR
  Filled 2024-06-13: qty 20

## 2024-06-13 MED ORDER — STERILE WATER FOR INJECTION IJ SOLN
INTRAMUSCULAR | Status: AC
  Administered 2024-06-13: 1.2 mL
  Filled 2024-06-13: qty 10

## 2024-06-13 NOTE — ED Triage Notes (Signed)
 Patient brought in by EMS from sitting on side of curb. Patient birthday is today and he drank copious amount of white liquor, denies any street drugs. Patient combative with EMS and was given 2.5 Versed . Patient pulled IV out that EMS placed.

## 2024-06-14 ENCOUNTER — Encounter (HOSPITAL_COMMUNITY): Payer: Self-pay | Admitting: Psychiatry

## 2024-06-14 DIAGNOSIS — F1420 Cocaine dependence, uncomplicated: Secondary | ICD-10-CM

## 2024-06-14 DIAGNOSIS — F10129 Alcohol abuse with intoxication, unspecified: Secondary | ICD-10-CM

## 2024-06-14 DIAGNOSIS — F1220 Cannabis dependence, uncomplicated: Secondary | ICD-10-CM

## 2024-06-14 LAB — CBC WITH DIFFERENTIAL/PLATELET
Abs Immature Granulocytes: 0.01 K/uL (ref 0.00–0.07)
Basophils Absolute: 0 K/uL (ref 0.0–0.1)
Basophils Relative: 1 %
Eosinophils Absolute: 0.1 K/uL (ref 0.0–0.5)
Eosinophils Relative: 1 %
HCT: 41.6 % (ref 39.0–52.0)
Hemoglobin: 13.6 g/dL (ref 13.0–17.0)
Immature Granulocytes: 0 %
Lymphocytes Relative: 24 %
Lymphs Abs: 1.9 K/uL (ref 0.7–4.0)
MCH: 30 pg (ref 26.0–34.0)
MCHC: 32.7 g/dL (ref 30.0–36.0)
MCV: 91.6 fL (ref 80.0–100.0)
Monocytes Absolute: 0.6 K/uL (ref 0.1–1.0)
Monocytes Relative: 7 %
Neutro Abs: 5.2 K/uL (ref 1.7–7.7)
Neutrophils Relative %: 67 %
Platelets: 301 K/uL (ref 150–400)
RBC: 4.54 MIL/uL (ref 4.22–5.81)
RDW: 14.3 % (ref 11.5–15.5)
WBC: 7.8 K/uL (ref 4.0–10.5)
nRBC: 0 % (ref 0.0–0.2)

## 2024-06-14 LAB — COMPREHENSIVE METABOLIC PANEL WITH GFR
ALT: 20 U/L (ref 0–44)
AST: 29 U/L (ref 15–41)
Albumin: 3.5 g/dL (ref 3.5–5.0)
Alkaline Phosphatase: 68 U/L (ref 38–126)
Anion gap: 14 (ref 5–15)
BUN: 17 mg/dL (ref 6–20)
CO2: 18 mmol/L — ABNORMAL LOW (ref 22–32)
Calcium: 8.6 mg/dL — ABNORMAL LOW (ref 8.9–10.3)
Chloride: 111 mmol/L (ref 98–111)
Creatinine, Ser: 0.83 mg/dL (ref 0.61–1.24)
GFR, Estimated: >60 mL/min (ref >60)
Glucose, Bld: 82 mg/dL (ref 70–99)
Potassium: 3.2 mmol/L — ABNORMAL LOW (ref 3.5–5.1)
Sodium: 143 mmol/L (ref 135–145)
Total Bilirubin: 0.6 mg/dL (ref 0.0–1.2)
Total Protein: 7.4 g/dL (ref 6.5–8.1)

## 2024-06-14 LAB — ETHANOL: Alcohol, Ethyl (B): 209 mg/dL — ABNORMAL HIGH (ref <15)

## 2024-06-14 LAB — RAPID URINE DRUG SCREEN, HOSP PERFORMED
Amphetamines: NOT DETECTED
Barbiturates: NOT DETECTED
Benzodiazepines: POSITIVE — AB
Cocaine: POSITIVE — AB
Opiates: NOT DETECTED
Tetrahydrocannabinol: POSITIVE — AB

## 2024-06-14 MED ORDER — QUETIAPINE FUMARATE 100 MG PO TABS: 100.0000 mg | ORAL_TABLET | Freq: Every day | ORAL | 0 refills | Status: AC

## 2024-06-14 MED ORDER — DIVALPROEX SODIUM ER 500 MG PO TB24: 500.0000 mg | ORAL_TABLET | Freq: Every day | ORAL | 0 refills | Status: AC

## 2024-06-14 NOTE — ED Notes (Signed)
 Patient soiled himself, cleaned, blue scrubs applied and linen changed.
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
## 2024-06-14 NOTE — Discharge Instructions (Addendum)
 Please consider close outpatient follow-up with substance abuse/mental health resources Please strictly adhere to safety return precautions Please consider consider close outpatient follow-up with outpatient medication management/therapy services Please consider abstinence from illicit substance use and EtOH    RESOURCE GUIDE  Chronic Pain Problems: Contact [facility]  [phone] Patients need to be referred by their primary care doctor.  Insufficient Money for Medicine: Contact [employer]:  call [phone]  No Primary Care Doctor: Call [facility]  [phone] - can help you locate a primary care doctor that  accepts your insurance, provides certain services, etc. Physician Referral Service- [phone]  Agencies that provide inexpensive medical care: [facility]  [phone] [facility] Internal Medicine  [phone] [facility]  [phone] [facility]  [phone] [facility]  [phone] [facility]  [phone]  Medicaid-accepting [facility] Providers: [facility]- [address]  [phone], Mon-Fri 9am-7pm, Sat 9am-1pm [facility]- [address] [place], [address] [place]  [phone] [facility]- [address] [place]  [phone] [facility]- [address]  [phone] Kennieth Leech- [address] [place], [address], [phone]  Only accepts [place] patients after they have their name  applied to their card  Self Pay (no insurance) in [facility]: Sickle Cell Patients - [facility] Internal Medicine  [address] [place], [phone] [facility] Urgent Care- [address] [place]  [phone]       [place] [facility] [place]- [address]       -     [facility]- see information above (Speak to [employer] if you do not have insurance)       -  [facility]- [address],   [phone]       -  [facility]- [address] [phone]       -  Dr [place]ina-  [address], [place], [phone]       -  Urgent Medical and [facility] - [address], [phone]       -  [facility]- [address], [phone], also [address] [phone]       -     [facility]- [address] [place], [phone], 1st & 3rd Saturday         every month, 10am-1pm  -     [facility] and [facility]   [address] [place], [place].   Phone:  [phone], Fax:  [phone]. Hours of Operation:  9 am - 6 pm, M-F.  -     [facility]   [address], [place]   Phone: [phone], Fax: [phone]. Hours of Operation:  8:30 am - 5:30 pm, M-F.    Dental Assistance If unable to pay or uninsured, contact:  [facility] [place] [facility]. to become qualified for the adult dental clinic.  Patients with Medicaid: [facility] [phone] [address], [phone] [address], [phone]  If unable to pay, or uninsured, contact [facility] [phone] in [place], [phone] in [facility]) to become qualified for the adult dental clinic  [facility] [address] [place], [place] [address] [phone] [email or web address]  Other IT consultant: [facility]-  [address], [place], [place], [address], [phone], Ext. [phone], 2nd and 4th Thursday of the month at 6:30am.  10 clients each day by appointment, can sometimes see walk-in patients if someone does not show for an appointment. [facility]- [address] [place], [place], [address], [phone] [facility] [address] [place], [place], [address], [phone] [facility] Health Department- [phone] [facility] Health Department- [phone] [facility] [phone]

## 2024-06-14 NOTE — ED Provider Notes (Addendum)
 Cleared by behavioral health for dc Medically cleared Clinically sober, ambulatory, tolerating po Requesting refill on meds, given short course, advised to f/u [facility] for further medication Given o/p resources       Thomas [name] LABOR, DO 06/14/24 1020
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
## 2024-06-14 NOTE — ED Notes (Signed)
 IVC paperwork complete and in purple zone, expires 06/21/24, case # [id number]

## 2024-06-14 NOTE — ED Notes (Signed)
 Rescind Notice of Commitment Change was completed by Dr. Eliane, scanned uploaded to the patient's chart and e-filed to the [facility] of Courts.  The rescind notice was documented in the e-ivc binder in the orange zone. The original was placed in the red Magistrate's folder and a copy was sent to medical records.

## 2024-06-14 NOTE — ED Notes (Signed)
 IVC paperwork complete and in orange zone, expires 06/21/24, case # [id number]

## 2024-06-14 NOTE — ED Provider Notes (Signed)
 Emergency Medicine Observation Re-evaluation Note  Thomas [name] is a 53 y.o. male, seen on rounds today.  Pt initially presented to the ED for complaints of substance use/abuse and dysfunctional behaviors while intoxicated. Currently calm, rest, no distress.   Physical Exam  BP 97/62 (BP Location: Left Arm)   Pulse 90   Temp 98.3 F (36.8 C)   Resp 17   Ht 1.803 m (5' 11)   Wt 61.6 kg   SpO2 96%   BMI 18.95 kg/m  Physical Exam General: resting, easily aroused.  Cardiac: regular rate.  Lungs: breathing comfortably. Psych: calm. Does not appear to be responding to internal stimuli. Currently denies thoughts of harm to self or others.  ED Course / MDM    I have reviewed the labs performed to date as well as medications administered while in observation.  Recent changes in the last 24 hours include ED obs, reassessment.   Plan  Pt is awaiting BH evaluation.     Bernard Drivers, MD 06/14/24 [phone]

## 2024-06-14 NOTE — ED Notes (Signed)
 Case Number: [id number]  3 copies verified and current. IVC documents are set to expire on 06/21/2024

## 2024-08-26 ENCOUNTER — Other Ambulatory Visit: Payer: Self-pay

## 2024-09-02 ENCOUNTER — Other Ambulatory Visit: Payer: Self-pay
